# Patient Record
Sex: Male | Born: 1940 | Race: Black or African American | Hispanic: No | Marital: Married | Smoking: Current every day smoker
Health system: Southern US, Community
[De-identification: ages and names within clinical notes are randomized; demographics above are authoritative.]

## PROBLEM LIST (undated history)

## (undated) DIAGNOSIS — J449 Chronic obstructive pulmonary disease, unspecified: Secondary | ICD-10-CM

## (undated) DIAGNOSIS — M179 Osteoarthritis of knee, unspecified: Secondary | ICD-10-CM

## (undated) DIAGNOSIS — F329 Major depressive disorder, single episode, unspecified: Secondary | ICD-10-CM

## (undated) DIAGNOSIS — B9681 Helicobacter pylori [H. pylori] as the cause of diseases classified elsewhere: Secondary | ICD-10-CM

## (undated) DIAGNOSIS — I10 Essential (primary) hypertension: Secondary | ICD-10-CM

## (undated) DIAGNOSIS — K279 Peptic ulcer, site unspecified, unspecified as acute or chronic, without hemorrhage or perforation: Secondary | ICD-10-CM

## (undated) DIAGNOSIS — F419 Anxiety disorder, unspecified: Secondary | ICD-10-CM

## (undated) DIAGNOSIS — N029 Recurrent and persistent hematuria with unspecified morphologic changes: Secondary | ICD-10-CM

## (undated) DIAGNOSIS — K259 Gastric ulcer, unspecified as acute or chronic, without hemorrhage or perforation: Secondary | ICD-10-CM

## (undated) DIAGNOSIS — F99 Mental disorder, not otherwise specified: Secondary | ICD-10-CM

## (undated) DIAGNOSIS — I2510 Atherosclerotic heart disease of native coronary artery without angina pectoris: Secondary | ICD-10-CM

## (undated) DIAGNOSIS — Z9989 Dependence on other enabling machines and devices: Secondary | ICD-10-CM

## (undated) DIAGNOSIS — K219 Gastro-esophageal reflux disease without esophagitis: Secondary | ICD-10-CM

## (undated) DIAGNOSIS — F32A Depression, unspecified: Secondary | ICD-10-CM

## (undated) DIAGNOSIS — G4733 Obstructive sleep apnea (adult) (pediatric): Secondary | ICD-10-CM

## (undated) DIAGNOSIS — M1710 Unilateral primary osteoarthritis, unspecified knee: Secondary | ICD-10-CM

## (undated) DIAGNOSIS — E119 Type 2 diabetes mellitus without complications: Secondary | ICD-10-CM

## (undated) DIAGNOSIS — E7800 Pure hypercholesterolemia, unspecified: Secondary | ICD-10-CM

## (undated) DIAGNOSIS — K635 Polyp of colon: Secondary | ICD-10-CM

## (undated) DIAGNOSIS — K649 Unspecified hemorrhoids: Secondary | ICD-10-CM

## (undated) DIAGNOSIS — K5730 Diverticulosis of large intestine without perforation or abscess without bleeding: Secondary | ICD-10-CM

## (undated) DIAGNOSIS — H269 Unspecified cataract: Secondary | ICD-10-CM

## (undated) HISTORY — PX: STERNAL WIRE REMOVAL: SHX2440

## (undated) HISTORY — DX: Major depressive disorder, single episode, unspecified: F32.9

## (undated) HISTORY — PX: CARDIAC CATHETERIZATION: SHX172

## (undated) HISTORY — DX: Recurrent and persistent hematuria with unspecified morphologic changes: N02.9

## (undated) HISTORY — DX: Depression, unspecified: F32.A

## (undated) HISTORY — PX: BACK SURGERY: SHX140

## (undated) HISTORY — DX: Osteoarthritis of knee, unspecified: M17.9

## (undated) HISTORY — PX: KNEE SURGERY: SHX244

## (undated) HISTORY — DX: Gastro-esophageal reflux disease without esophagitis: K21.9

## (undated) HISTORY — DX: Unilateral primary osteoarthritis, unspecified knee: M17.10

## (undated) HISTORY — PX: OTHER SURGICAL HISTORY: SHX169

---

## 1979-06-02 HISTORY — PX: HEMORRHOID SURGERY: SHX153

## 2003-05-21 ENCOUNTER — Ambulatory Visit (HOSPITAL_COMMUNITY): Admission: RE | Admit: 2003-05-21 | Discharge: 2003-05-21 | Payer: Self-pay | Admitting: Internal Medicine

## 2003-05-23 ENCOUNTER — Ambulatory Visit (HOSPITAL_COMMUNITY): Admission: RE | Admit: 2003-05-23 | Discharge: 2003-05-23 | Payer: Self-pay | Admitting: Internal Medicine

## 2004-06-01 DIAGNOSIS — B9681 Helicobacter pylori [H. pylori] as the cause of diseases classified elsewhere: Secondary | ICD-10-CM

## 2004-06-01 DIAGNOSIS — K259 Gastric ulcer, unspecified as acute or chronic, without hemorrhage or perforation: Secondary | ICD-10-CM

## 2004-06-01 HISTORY — DX: Gastric ulcer, unspecified as acute or chronic, without hemorrhage or perforation: K25.9

## 2004-06-01 HISTORY — PX: CORONARY ARTERY BYPASS GRAFT: SHX141

## 2004-06-01 HISTORY — DX: Helicobacter pylori (H. pylori) as the cause of diseases classified elsewhere: B96.81

## 2004-08-11 ENCOUNTER — Ambulatory Visit: Payer: Self-pay | Admitting: Internal Medicine

## 2004-08-11 ENCOUNTER — Ambulatory Visit: Payer: Self-pay | Admitting: Cardiology

## 2004-08-14 ENCOUNTER — Ambulatory Visit: Payer: Self-pay | Admitting: Cardiology

## 2004-08-14 ENCOUNTER — Inpatient Hospital Stay (HOSPITAL_BASED_OUTPATIENT_CLINIC_OR_DEPARTMENT_OTHER): Admission: RE | Admit: 2004-08-14 | Discharge: 2004-08-14 | Payer: Self-pay | Admitting: Cardiology

## 2004-08-14 ENCOUNTER — Inpatient Hospital Stay (HOSPITAL_COMMUNITY): Admission: AD | Admit: 2004-08-14 | Discharge: 2004-08-21 | Payer: Self-pay | Admitting: Cardiology

## 2004-09-09 ENCOUNTER — Ambulatory Visit: Payer: Self-pay | Admitting: Cardiology

## 2004-10-20 ENCOUNTER — Ambulatory Visit: Payer: Self-pay | Admitting: Cardiology

## 2004-12-05 ENCOUNTER — Ambulatory Visit: Payer: Self-pay | Admitting: Cardiology

## 2004-12-10 ENCOUNTER — Inpatient Hospital Stay (HOSPITAL_COMMUNITY): Admission: AD | Admit: 2004-12-10 | Discharge: 2004-12-12 | Payer: Self-pay | Admitting: Internal Medicine

## 2004-12-10 ENCOUNTER — Ambulatory Visit: Payer: Self-pay | Admitting: Internal Medicine

## 2004-12-11 ENCOUNTER — Ambulatory Visit: Payer: Self-pay | Admitting: Gastroenterology

## 2004-12-26 ENCOUNTER — Ambulatory Visit: Payer: Self-pay | Admitting: Internal Medicine

## 2005-02-25 ENCOUNTER — Ambulatory Visit: Payer: Self-pay | Admitting: Cardiology

## 2005-06-30 ENCOUNTER — Ambulatory Visit: Payer: Self-pay | Admitting: Cardiology

## 2005-11-16 ENCOUNTER — Ambulatory Visit: Payer: Self-pay | Admitting: Internal Medicine

## 2006-10-31 DIAGNOSIS — N029 Recurrent and persistent hematuria with unspecified morphologic changes: Secondary | ICD-10-CM

## 2006-10-31 HISTORY — DX: Recurrent and persistent hematuria with unspecified morphologic changes: N02.9

## 2007-08-06 ENCOUNTER — Encounter: Payer: Self-pay | Admitting: *Deleted

## 2007-08-06 DIAGNOSIS — Z8719 Personal history of other diseases of the digestive system: Secondary | ICD-10-CM

## 2007-08-06 DIAGNOSIS — J449 Chronic obstructive pulmonary disease, unspecified: Secondary | ICD-10-CM

## 2007-08-06 DIAGNOSIS — Z951 Presence of aortocoronary bypass graft: Secondary | ICD-10-CM

## 2007-08-06 DIAGNOSIS — F17200 Nicotine dependence, unspecified, uncomplicated: Secondary | ICD-10-CM | POA: Insufficient documentation

## 2007-08-06 DIAGNOSIS — K5730 Diverticulosis of large intestine without perforation or abscess without bleeding: Secondary | ICD-10-CM | POA: Insufficient documentation

## 2007-08-06 DIAGNOSIS — J4489 Other specified chronic obstructive pulmonary disease: Secondary | ICD-10-CM | POA: Insufficient documentation

## 2007-08-06 DIAGNOSIS — E785 Hyperlipidemia, unspecified: Secondary | ICD-10-CM

## 2007-08-06 DIAGNOSIS — Z8711 Personal history of peptic ulcer disease: Secondary | ICD-10-CM

## 2007-08-06 DIAGNOSIS — I2510 Atherosclerotic heart disease of native coronary artery without angina pectoris: Secondary | ICD-10-CM | POA: Insufficient documentation

## 2007-08-06 DIAGNOSIS — Z9889 Other specified postprocedural states: Secondary | ICD-10-CM

## 2010-10-31 HISTORY — PX: OTHER SURGICAL HISTORY: SHX169

## 2011-02-09 ENCOUNTER — Telehealth: Payer: Self-pay | Admitting: *Deleted

## 2011-02-09 DIAGNOSIS — Z0000 Encounter for general adult medical examination without abnormal findings: Secondary | ICD-10-CM

## 2011-02-09 NOTE — Telephone Encounter (Signed)
[facility] 

## 2012-06-20 ENCOUNTER — Encounter (HOSPITAL_COMMUNITY): Payer: Self-pay | Admitting: *Deleted

## 2012-06-20 ENCOUNTER — Observation Stay (HOSPITAL_COMMUNITY)
Admission: EM | Admit: 2012-06-20 | Discharge: 2012-06-22 | Disposition: A | Discharge status: Home / Self Care | Payer: Non-veteran care | Attending: Internal Medicine | Admitting: Internal Medicine

## 2012-06-20 ENCOUNTER — Telehealth: Payer: Self-pay | Admitting: Internal Medicine

## 2012-06-20 DIAGNOSIS — G4730 Sleep apnea, unspecified: Secondary | ICD-10-CM | POA: Insufficient documentation

## 2012-06-20 DIAGNOSIS — I10 Essential (primary) hypertension: Secondary | ICD-10-CM | POA: Insufficient documentation

## 2012-06-20 DIAGNOSIS — K5730 Diverticulosis of large intestine without perforation or abscess without bleeding: Secondary | ICD-10-CM | POA: Diagnosis present

## 2012-06-20 DIAGNOSIS — K922 Gastrointestinal hemorrhage, unspecified: Secondary | ICD-10-CM | POA: Diagnosis present

## 2012-06-20 DIAGNOSIS — K625 Hemorrhage of anus and rectum: Secondary | ICD-10-CM

## 2012-06-20 DIAGNOSIS — Z860100 Personal history of colon polyps, unspecified: Secondary | ICD-10-CM | POA: Insufficient documentation

## 2012-06-20 DIAGNOSIS — K921 Melena: Principal | ICD-10-CM | POA: Insufficient documentation

## 2012-06-20 DIAGNOSIS — Z79899 Other long term (current) drug therapy: Secondary | ICD-10-CM | POA: Insufficient documentation

## 2012-06-20 DIAGNOSIS — Z7982 Long term (current) use of aspirin: Secondary | ICD-10-CM | POA: Insufficient documentation

## 2012-06-20 DIAGNOSIS — F17200 Nicotine dependence, unspecified, uncomplicated: Secondary | ICD-10-CM

## 2012-06-20 DIAGNOSIS — E7800 Pure hypercholesterolemia, unspecified: Secondary | ICD-10-CM | POA: Insufficient documentation

## 2012-06-20 DIAGNOSIS — Z791 Long term (current) use of non-steroidal anti-inflammatories (NSAID): Secondary | ICD-10-CM | POA: Insufficient documentation

## 2012-06-20 DIAGNOSIS — I2510 Atherosclerotic heart disease of native coronary artery without angina pectoris: Secondary | ICD-10-CM | POA: Insufficient documentation

## 2012-06-20 HISTORY — DX: Dependence on other enabling machines and devices: Z99.89

## 2012-06-20 HISTORY — DX: Pure hypercholesterolemia, unspecified: E78.00

## 2012-06-20 HISTORY — DX: Diverticulosis of large intestine without perforation or abscess without bleeding: K57.30

## 2012-06-20 HISTORY — DX: Unspecified cataract: H26.9

## 2012-06-20 HISTORY — DX: Essential (primary) hypertension: I10

## 2012-06-20 HISTORY — DX: Helicobacter pylori (H. pylori) as the cause of diseases classified elsewhere: B96.81

## 2012-06-20 HISTORY — DX: Mental disorder, not otherwise specified: F99

## 2012-06-20 HISTORY — DX: Peptic ulcer, site unspecified, unspecified as acute or chronic, without hemorrhage or perforation: K27.9

## 2012-06-20 HISTORY — DX: Polyp of colon: K63.5

## 2012-06-20 HISTORY — DX: Atherosclerotic heart disease of native coronary artery without angina pectoris: I25.10

## 2012-06-20 HISTORY — DX: Gastric ulcer, unspecified as acute or chronic, without hemorrhage or perforation: K25.9

## 2012-06-20 HISTORY — DX: Chronic obstructive pulmonary disease, unspecified: J44.9

## 2012-06-20 HISTORY — DX: Obstructive sleep apnea (adult) (pediatric): G47.33

## 2012-06-20 HISTORY — DX: Unspecified hemorrhoids: K64.9

## 2012-06-20 LAB — CBC WITH DIFFERENTIAL/PLATELET
Basophils Absolute: 0 10*3/uL (ref 0.0–0.1)
Basophils Relative: 0 % (ref 0–1)
Eosinophils Absolute: 0.1 10*3/uL (ref 0.0–0.7)
Eosinophils Relative: 1 % (ref 0–5)
HCT: 43 % (ref 39.0–52.0)
Hemoglobin: 14.8 g/dL (ref 13.0–17.0)
Lymphocytes Relative: 37 % (ref 12–46)
Lymphs Abs: 2.7 10*3/uL (ref 0.7–4.0)
MCH: 29.8 pg (ref 26.0–34.0)
MCHC: 34.4 g/dL (ref 30.0–36.0)
MCV: 86.7 fL (ref 78.0–100.0)
Monocytes Absolute: 0.7 10*3/uL (ref 0.1–1.0)
Monocytes Relative: 10 % (ref 3–12)
Neutro Abs: 3.8 10*3/uL (ref 1.7–7.7)
Neutrophils Relative %: 52 % (ref 43–77)
Platelets: 247 10*3/uL (ref 150–400)
RBC: 4.96 MIL/uL (ref 4.22–5.81)
RDW: 13 % (ref 11.5–15.5)
WBC: 7.4 10*3/uL (ref 4.0–10.5)

## 2012-06-20 LAB — COMPREHENSIVE METABOLIC PANEL
ALT: 15 U/L (ref 0–53)
AST: 12 U/L (ref 0–37)
Albumin: 3.5 g/dL (ref 3.5–5.2)
Alkaline Phosphatase: 53 U/L (ref 39–117)
BUN: 17 mg/dL (ref 6–23)
CO2: 26 mEq/L (ref 19–32)
Calcium: 9 mg/dL (ref 8.4–10.5)
Chloride: 104 mEq/L (ref 96–112)
Creatinine, Ser: 0.94 mg/dL (ref 0.50–1.35)
GFR calc Af Amer: >90 mL/min (ref >90)
GFR calc non Af Amer: 83 mL/min — ABNORMAL LOW (ref >90)
Glucose, Bld: 116 mg/dL — ABNORMAL HIGH (ref 70–99)
Potassium: 3.8 mEq/L (ref 3.5–5.1)
Sodium: 140 mEq/L (ref 135–145)
Total Bilirubin: 0.8 mg/dL (ref 0.3–1.2)
Total Protein: 6.7 g/dL (ref 6.0–8.3)

## 2012-06-20 LAB — APTT: aPTT: 32 seconds (ref 24–37)

## 2012-06-20 LAB — PROTIME-INR
INR: 0.95 (ref 0.00–1.49)
Prothrombin Time: 12.6 seconds (ref 11.6–15.2)

## 2012-06-20 LAB — OCCULT BLOOD, POC DEVICE: Fecal Occult Bld: POSITIVE — AB

## 2012-06-20 MED ORDER — SODIUM CHLORIDE 0.9 % IV SOLN
INTRAVENOUS | Status: DC
  Administered 2012-06-20 – 2012-06-21 (×2): via INTRAVENOUS

## 2012-06-20 MED ORDER — CITALOPRAM HYDROBROMIDE 10 MG PO TABS
5.0000 mg | ORAL_TABLET | Freq: Every day | ORAL | Status: DC
  Administered 2012-06-20 – 2012-06-22 (×3): 5 mg via ORAL
  Filled 2012-06-20 (×3): qty 1

## 2012-06-20 MED ORDER — ONDANSETRON HCL 4 MG/2ML IJ SOLN: 4.0000 mg | Freq: Four times a day (QID) | INTRAMUSCULAR | Status: DC | PRN

## 2012-06-20 MED ORDER — ACETAMINOPHEN 325 MG PO TABS: 650.0000 mg | ORAL_TABLET | Freq: Four times a day (QID) | ORAL | Status: DC | PRN

## 2012-06-20 MED ORDER — ONDANSETRON HCL 4 MG PO TABS: 4.0000 mg | ORAL_TABLET | Freq: Four times a day (QID) | ORAL | Status: DC | PRN

## 2012-06-20 MED ORDER — ONDANSETRON HCL 4 MG/2ML IJ SOLN: 4.0000 mg | Freq: Three times a day (TID) | INTRAMUSCULAR | Status: DC | PRN

## 2012-06-20 MED ORDER — SODIUM CHLORIDE 0.9 % IV SOLN
Freq: Once | INTRAVENOUS | Status: AC
  Administered 2012-06-20: 13:00:00 via INTRAVENOUS

## 2012-06-20 MED ORDER — ACETAMINOPHEN 650 MG RE SUPP: 650.0000 mg | Freq: Four times a day (QID) | RECTAL | Status: DC | PRN

## 2012-06-20 NOTE — ED Notes (Signed)
Pt reports having blood in his stool x 2 days.  Denies pain. Hx of bleeding polyp.  Pt denies dizziness.  Pt does not appear pale.  Ambulatory without difficulty.

## 2012-06-20 NOTE — ED Provider Notes (Signed)
History     CSN: [id number]  Arrival date & time 06/20/12  1049   First MD Initiated Contact with Patient 06/20/12 1114      Chief Complaint  Patient presents with  . Rectal Bleeding    (Consider location/radiation/quality/duration/timing/severity/associated sxs/prior treatment) HPI Comments: Patient reports painless rectal bleeding that he noticed on Saturday evening associated with some stool. He reports he had several all day yesterday, more than 5 episodes of just blood with no stool. He denies any abdominal pain or rectal pain. He has a history of colonic polyps that were removed many years in the past as well as hemorrhoids. He reports he normally goes to the [facility] and last had a colonoscopy about 3 years ago there and also had a colonoscopy with [facility] many years ago with polyps removed at that time as well. He denies feeling lightheaded, faint. He denies night sweats, unusual weight loss. He reports no prior history of cancers. He also denies history of diverticulosis. He used to see Dr. Debby Bud but has not seen him in many years. Family spoke to the office this morning but I do not believe any callbacks were obtained. Due to the frequency of the bloody stools and continuation this morning, the patient presented himself to the emergency department by private vehicle.  Patient is a 72 y.o. male presenting with hematochezia. The history is provided by the patient and the spouse.  Rectal Bleeding  Pertinent negatives include no fever, no abdominal pain, no diarrhea, no nausea, no vomiting, no chest pain and no headaches.    Past Medical History  Diagnosis Date  . Hypertension   . Hypercholesteremia   . Colon polyps hemorrhoid  . Hemorrhoid     Past Surgical History  Procedure Date  . Knee surgery     left knee    History reviewed. No pertinent family history.  History  Substance Use Topics  . Smoking status: Current Every Day Smoker -- 0.5 packs/day    Types:  Cigarettes  . Smokeless tobacco: Not on file  . Alcohol Use: Yes     Comment: socially      Review of Systems  Constitutional: Negative for fever, chills and fatigue.  Respiratory: Negative for shortness of breath.   Cardiovascular: Negative for chest pain.  Gastrointestinal: Positive for blood in stool, hematochezia and anal bleeding. Negative for nausea, vomiting, abdominal pain, diarrhea and abdominal distention.  Musculoskeletal: Negative for back pain.  Neurological: Negative for dizziness, syncope, light-headedness and headaches.  All other systems reviewed and are negative.    Allergies  Sulfonamide derivatives  Home Medications   Current Outpatient Rx  Name  Route  Sig  Dispense  Refill  . ASPIRIN EC 81 MG PO TBEC   Oral   Take 81 mg by mouth daily.         Marland Kitchen CITALOPRAM HYDROBROMIDE 10 MG PO TABS   Oral   Take 5 mg by mouth daily. Take one-half tablet by mouth once every day.         . IBUPROFEN 200 MG PO TABS   Oral   Take 200 mg by mouth every 6 (six) hours as needed. For pain.         Marland Kitchen LISINOPRIL 20 MG PO TABS   Oral   Take 10 mg by mouth daily. Take one-half tab by mouth once every day for blood pressure.         Marland Kitchen METOPROLOL SUCCINATE ER 25 MG PO TB24  Oral   Take 25 mg by mouth 2 (two) times daily.         Marland Kitchen NAPROXEN 500 MG PO TABS   Oral   Take 500 mg by mouth 2 (two) times daily with a meal.           BP 136/68  Pulse 73  Temp 97.6 F (36.4 C) (Oral)  Resp 18  SpO2 100%  Physical Exam  Nursing note and vitals reviewed. Constitutional: He is oriented to person, place, and time. He appears well-developed and well-nourished. No distress.  HENT:  Head: Normocephalic and atraumatic.  Neck: Normal range of motion. Neck supple.  Cardiovascular: Normal rate.   No murmur heard. Pulmonary/Chest: Effort normal. No respiratory distress.  Abdominal: Soft. Bowel sounds are normal. He exhibits no distension. There is no tenderness.  There is no rebound.  Neurological: He is alert and oriented to person, place, and time.  Skin: Skin is warm and dry. He is not diaphoretic.  Psychiatric: He has a normal mood and affect.    ED Course  Procedures (including critical care time)  Labs Reviewed  COMPREHENSIVE METABOLIC PANEL - Abnormal; Notable for the following:    Glucose, Bld 116 (*)     GFR calc non Af Amer 83 (*)     All other components within normal limits  CBC WITH DIFFERENTIAL   No results found.   1. Rectal bleeding     Room air saturation is 97% and I interpret this to be on normal.  1:12 PM [facility] has been consulted.  1:16 PM Will speak to [facility].  I spoke to Dr. Debby Bud who will be accepting pt back onto his clinic as an established patient.    MDM   Patient with painless rectal bleeding, and no symptoms of orthostasis by history. His blood pressure and heart rate are normal here. Plan is to check orthostatics, a hemoglobin and will do a rectal examination to assess for amount of rectal bleeding. Patient will need a colonoscopy either as an outpatient or inpatient.  He does not appear to be unstable.  He is not on coumadin or plavix.  He is on naprosyn.  He is told to discontinue naprosyn for now.          Gavin Pound. Auguste Tebbetts, MD 06/20/12 1316

## 2012-06-20 NOTE — Telephone Encounter (Signed)
But of course. Yes

## 2012-06-20 NOTE — H&P (Signed)
[facility] History and Physical  Bradley French ZOX:[id number] DOB: 10/26/1940 DOA: 06/20/2012  Referring physician: Dr. Oletta Lamas PCP: Pcp Not In System  Specialists: Dr. Leone Payor  Chief Complaint: Bright red blood per rectum  HPI: Bradley French is a 72 y.o. male  This is a 72 year old male with a history of diverticulosis in 2006, 2 previous episodes of lower GI bleed, presents with a chief complaint of bright red blood since Saturday night. He states that he has had several bowel movements in which she saw just blood. He estimates more than 2 cups of blood in the last day. His last bloody bowel movement was last night. He denies any lightheadedness, dizziness, chest pain, breathing difficulties. He has no abdominal pain, denies any nausea or vomiting. He has no fever and has no chills. He is taking NSAIDs, in the form of naproxen twice daily and ibuprofen daily. He drinks alcohol mostly in weekends. He considers himself a moderate drinker, but he cannot really elaborate on how many servings he has.  He also has a history of coronary artery disease status post CABG. He denies any chest pain.  Review of Systems: 10 system review of system was performed, pertinent positives and negatives as per history of present illness.  Past Medical History  Diagnosis Date  . Hypertension   . Hypercholesteremia   . Colon polyps ???    none on 2006 colonoscopy  . Hemorrhoid   . Diverticulosis of colon   . Gastric ulcer 2006    ? secondary to H Pylori, vs ASA.   Marland Kitchen H pylori ulcer 2006    treated with Prevpac  . Coronary artery disease   . Mental disorder   . COPD (chronic obstructive pulmonary disease)    Past Surgical History  Procedure Date  . Knee surgery     left knee  . Coronary artery bypass graft 2006    2 vessell  . Hemorrhoid surgery 1981   Social History:  reports that he has been smoking Cigarettes.  He has been smoking about .5 packs per day. He does not have any smokeless  tobacco history on file. He reports that he drinks alcohol. He reports that he does not use illicit drugs.  Allergies  Allergen Reactions  . Sulfonamide Derivatives Rash    Family History  Problem Relation Age of Onset  . Diabetes Brother     Prior to Admission medications   Medication Sig Start Date End Date Taking? Authorizing Provider  aspirin EC 81 MG tablet Take 81 mg by mouth daily.   Yes Historical Provider, MD  citalopram (CELEXA) 10 MG tablet Take 5 mg by mouth daily. Take one-half tablet by mouth once every day.   Yes Historical Provider, MD  ibuprofen (ADVIL,MOTRIN) 200 MG tablet Take 200 mg by mouth every 6 (six) hours as needed. For pain.   Yes Historical Provider, MD  lisinopril (PRINIVIL,ZESTRIL) 20 MG tablet Take 10 mg by mouth daily. Take one-half tab by mouth once every day for blood pressure.   Yes Historical Provider, MD  metoprolol succinate (TOPROL-XL) 25 MG 24 hr tablet Take 25 mg by mouth 2 (two) times daily.   Yes Historical Provider, MD  naproxen (NAPROSYN) 500 MG tablet Take 500 mg by mouth 2 (two) times daily with a meal.   Yes Historical Provider, MD   Physical Exam: Filed Vitals:   06/20/12 1300 06/20/12 1400 06/20/12 1500 06/20/12 1555  BP: 136/68 142/72 146/67 155/69  Pulse: 73 70 67  71  Temp:    98.3 F (36.8 C)  TempSrc:    Oral  Resp:   16 18  Height:    6\' 4"  (1.93 m)  Weight:    103.828 kg (228 lb 14.4 oz)  SpO2: 100% 100% 100% 97%     General:  He is in no acute distress  Eyes: No scleral icterus  ENT: Moist oropharynx  Neck: Supple, no JVD  Cardiovascular: Regular rate and rhythm, no murmurs  Respiratory: Clear to auscultation bilaterally  Abdomen: Soft nontender to palpation  Skin: No rashes  Musculoskeletal: Has no peripheral edema  Psychiatric: Normal mood and affect  Neurologic: No focal findings  Labs on Admission:  Basic Metabolic Panel:  Lab 06/20/12 1610  NA 140  K 3.8  CL 104  CO2 26  GLUCOSE 116*  BUN  17  CREATININE 0.94  CALCIUM 9.0  MG --  PHOS --   Liver Function Tests:  Lab 06/20/12 1059  AST 12  ALT 15  ALKPHOS 53  BILITOT 0.8  PROT 6.7  ALBUMIN 3.5   CBC:  Lab 06/20/12 1059  WBC 7.4  NEUTROABS 3.8  HGB 14.8  HCT 43.0  MCV 86.7  PLT 247   EKG: Independently reviewed.   Assessment/Plan Active Problems:  DIVERTICULOSIS, COLON  Lower GI bleed  CAD (coronary artery disease)   1. Lower GI bleed - gastroenterology service was consulted, we appreciate their input. Patient is hemodynamically stable. Orthostatics in the ER showed a 20 point decrease in his systolic blood pressure upon standing, however patient denies feeling lightheaded or dizzy. We'll give IV fluids. This is unlikely that it's an upper GI bleed. Her GI recommendations, will allow clear liquid diet for now and monitor overnight. We will hold his home medications, his lisinopril and metoprolol, to prevent further hypotension if he is to bleed more overnight. We will discontinue his naproxen, ibuprofen and aspirin. 2. Prophylaxis - will hold heparin secondary to his GI bleed.   Code Status: Full code (must indicate code status--if unknown or must be presumed, indicate so) Family Communication: None (indicate person spoken with, if applicable, with phone number if by telephone) Disposition Plan: 1-2 days (indicate anticipated LOS)  Time spent: 50  Pamella Pert [facility] Pager [phone]  If 7PM-7AM, please contact night-coverage [email or web address] Password [facility] 06/20/2012, 5:15 PM

## 2012-06-20 NOTE — Consult Note (Addendum)
[facility] Consult: 2:28 PM 06/20/2012   Referring Provider: Dr Oletta Lamas  [facility]  [facility] [place]  Primary Gastroenterologist:  Dr. Arlyce Dice   Reason for Consultation:  Painless rectal bleeding.   HPI: Bradley French is a 72 y.o. male.  Hx of diverticulosis 2006, GI bleed secondary to gastric ulcer in 2006.  Hx of colon polyps per his report, but none seen in 2006.  Last colonoscopy around 2011 at [facility].  It may have been  ? erlier Then that he had colon polyps.  Has not been sent call back letter from GI clinic, nor is he aware of when repeat study is due.   Developed painless hematochezia beginning on 1/18 in PM.  This continued 1/19 with at least 10 episodes.   Never became dizzy, SOB, weak.  Last bleeding was around midnite today.  Has occasional minor BPR with wiping.  He recalls prior similar bleeding in past but is vague as to when and the outcome.   Takes Naprosyn twice daily and Ibuprofen 200 mg once daily.  Drinks up to 1 pint of Brandy daily, but mostly drinks about 4 fingers of this on daily basis.     Past Medical History  Diagnosis Date  . Hypertension   . Hypercholesteremia   . Colon polyps ???    none on 2006 colonoscopy  . Hemorrhoid   . Diverticulosis of colon   . Gastric ulcer 2006    ? secondary to H Pylori, vs ASA.   Marland Kitchen H pylori ulcer 2006    treated with Prevpac    Past Surgical History  Procedure Date  . Knee surgery     left knee  . Coronary artery bypass graft 2006    2 vessell  . Hemorrhoid surgery 1981    Prior to Admission medications   Medication Sig Start Date End Date Taking? Authorizing Provider  aspirin EC 81 MG tablet Take 81 mg by mouth daily.   Yes Historical Provider, MD  citalopram (CELEXA) 10 MG tablet Take 5 mg by mouth daily. Take one-half tablet by mouth once every day.   Yes Historical Provider, MD  ibuprofen (ADVIL,MOTRIN) 200 MG tablet Take 200 mg by mouth every 6  (six) hours as needed. For pain.   Yes Historical Provider, MD  lisinopril (PRINIVIL,ZESTRIL) 20 MG tablet Take 10 mg by mouth daily. Take one-half tab by mouth once every day for blood pressure.   Yes Historical Provider, MD  metoprolol succinate (TOPROL-XL) 25 MG 24 hr tablet Take 25 mg by mouth 2 (two) times daily.   Yes Historical Provider, MD  naproxen (NAPROSYN) 500 MG tablet Take 500 mg by mouth 2 (two) times daily with a meal.   Yes Historical Provider, MD    Scheduled Meds:   Infusions:    . sodium chloride     PRN Meds: acetaminophen, acetaminophen, ondansetron (ZOFRAN) IV, ondansetron   Allergies as of 06/20/2012 - Review Complete 06/20/2012  Allergen Reaction Noted  . Sulfonamide derivatives Rash     Family HX - parents died of unknown causes when he was [place] - a brother has DM  History   Social History  . Marital Status: Married                 Occupational History  . Retired Soil scientist   Social History Main Topics  . Smoking status: Current Every Day Smoker -- 0.5 packs/day    Types: Cigarettes  . Smokeless tobacco:  Not on file  . Alcohol Use: Yes     Brandy:   4 fingers to 1 pint daily  . Drug Use: No  .     Social History Narrative  . Retired Location manager.     REVIEW OF SYSTEMS: Constitutional:  No weight gain.  Some 5 to 10 # weight loss.  ENT:  No nose bleeds.  Hi is HOH Pulm:  No cough, no SOB, no PND CV:  no chest pain or palps GU:  No dysuria or hematuria GI:  As per HPI Heme:  No hx of anemia.    Transfusions:  Recalls no transfusions of blood products Neuro:  No headache, no ataxia.  Derm:  No rash or sores MS:  Left knee pain so does not exercise.  Endocrine:  No excessive thirst or urination.  No sweats Immunization:  Fall 2013 flu shot.  Pneumovax status he does not know Travel:  none  PHYSICAL EXAM: Vital signs in last 24 hours: Temp:  [97.6 F (36.4 C)] 97.6 F (36.4 C) (01/20 1056) Pulse Rate:   [70-85] 70  (01/20 1400) Resp:  [18] 18  (01/20 1056) BP: (134-160)/(68-85) 142/72 mmHg (01/20 1400) SpO2:  [97 %-100 %] 100 % (01/20 1400)  General: well appearing elderly AAM.  comfortable Head:  No trauma or asymmetry  Eyes:  No icterus, no pallor Ears:  Somewhat HOH.  Nose:  No discharge or congestion Mouth:  Fair dentition.  No sores, no bleeding Neck:  No bruits, no TMG or masses.  No JVD Lungs:  Crackles in bases B.  Goes 1/2 way up lung on the right.  No cough or dyspnea Heart: RRR.  No MRG Abdomen:  Soft, NT, ND, no HSM  Or masses.   Rectal: no blood or stool on glove, no masses, no hemorrhoids   Musc/Skeltl: scars of left knee.  No joint swelling Extremities:  No pedal edema  Neurologic:  Oriented x 3.  Vague historian, recalls limited medical details.  No tremor.  No gross deficits Skin:  No rash, no telangectasia, no sores Tattoos:  none Nodes:  No cervical adenopathy   Psych:  Pleasant, relaxed      LAB RESULTS:  Basename 06/20/12 1059  WBC 7.4  HGB 14.8  HCT 43.0  PLT 247   BMET Lab Results  Component Value Date   NA 140 06/20/2012   K 3.8 06/20/2012   CL 104 06/20/2012   CO2 26 06/20/2012   GLUCOSE 116* 06/20/2012   BUN 17 06/20/2012   CREATININE 0.94 06/20/2012   CALCIUM 9.0 06/20/2012   LFT  Basename 06/20/12 1059  PROT 6.7  ALBUMIN 3.5  AST 12  ALT 15  ALKPHOS 53  BILITOT 0.8  BILIDIR --  IBILI --     RADIOLOGY STUDIES: None so far.   ENDOSCOPIC STUDIES: July 2006  This is copy/paste from  A d/c summary: "Esophagogastroduodenoscopy and colonoscopy July 13.  EGD withgastric ulcer, no acute hemorrhage, and duodenitis.  Colonoscopy with diffuse diverticulosis, no active bleeding identified. H Pylori positive, treated with Prevpac 2006" I am unable to locate actual reports.   IMPRESSION: *  Acute lower GI bleeding, probably diverticular.  Hemodynamically stable and not yet anemic. Last bleeding was 15 hours ago.  *  2006 gastric ulcer a/w  GI bleed, H Pylori treated then.  No longer takes nor requires PPI.  Does use regular doses of 2 different NSAIDS (needs education regarding this) His BUN is normal and not  having melenic stool, so less likely the bleeding is UGI in source.  *  Hx colon polyps, he is not sure these were found, but it was not in 2006 or prior to that.  PLAN: *  Per Dr Leone Payor Not clear he will need a colonoscopy.  He is seemingly up to date with colonoscopy surveillance *  Allow clear liquids.  I am not starting PPI, as this seems to be a LGIB.  *  CBC in AM   LOS: 0 days   [name]  06/20/2012, 2:28 PM Pager: [phone]  [facility] Attending  I have also seen and assessed the patient and agree with the above note. Hx and PE reviewed/repeate as appropriate. Some additions made.  Painless hematochezia/Lower GI bleed that appears stopped presently. Hgb still NL. Multiple past colonoscopies and has known diverticulosis. Supportive care for now and obtain record of last colonoscopy at [facility]. Agree w/ stopping NSAID's He is also on citalopram and SSRI impairs PLT activity some so might consider stopping that  Iva Boop, MD, [facility] [phone] (pager) 06/20/2012 4:39 PM

## 2012-06-20 NOTE — Telephone Encounter (Signed)
The patient's son called and is hoping to get the patient re-established with you.  The patient has not been seen for several years.  The son states he has been treated at the [facility] hospital, since he has [employer].  He now is experiencing rectal bleeding.  The son does not feel comfortable taking him to the [place], so he was going to take him to the emergency room this morning.   Is it okay if he re-establishes with you?

## 2012-06-21 DIAGNOSIS — K5730 Diverticulosis of large intestine without perforation or abscess without bleeding: Secondary | ICD-10-CM

## 2012-06-21 DIAGNOSIS — I2510 Atherosclerotic heart disease of native coronary artery without angina pectoris: Secondary | ICD-10-CM

## 2012-06-21 DIAGNOSIS — F17200 Nicotine dependence, unspecified, uncomplicated: Secondary | ICD-10-CM

## 2012-06-21 DIAGNOSIS — K922 Gastrointestinal hemorrhage, unspecified: Secondary | ICD-10-CM

## 2012-06-21 LAB — CBC
HCT: 37 % — ABNORMAL LOW (ref 39.0–52.0)
Hemoglobin: 12.7 g/dL — ABNORMAL LOW (ref 13.0–17.0)
MCH: 29.7 pg (ref 26.0–34.0)
MCHC: 34.3 g/dL (ref 30.0–36.0)
MCV: 86.4 fL (ref 78.0–100.0)

## 2012-06-21 MED ORDER — NICOTINE 14 MG/24HR TD PT24
14.0000 mg | MEDICATED_PATCH | Freq: Every day | TRANSDERMAL | Status: DC
  Administered 2012-06-21 – 2012-06-22 (×2): 14 mg via TRANSDERMAL
  Filled 2012-06-21 (×2): qty 1

## 2012-06-21 NOTE — Progress Notes (Signed)
     Bradley French 06/21/2012, 10:18 AM  SUBJECTIVE:       Brown stool after breakfast today.  Walking in hall without problems. Diet now is heart healthy/  OBJECTIVE:         Vital signs in last 24 hours:    Temp:  [97.6 F (36.4 C)-98.3 F (36.8 C)] 97.7 F (36.5 C) (01/21 0538) Pulse Rate:  [66-85] 66  (01/21 0538) Resp:  [16-18] 18  (01/21 0538) BP: (134-160)/(67-85) 145/79 mmHg (01/21 0538) SpO2:  [97 %-100 %] 97 % (01/21 0538) Weight:  [103.828 kg (228 lb 14.4 oz)] 103.828 kg (228 lb 14.4 oz) (01/20 1555) Last BM Date: 06/20/12 General: looks well.    Heart: RRR Chest: clear B.  No resp distress Abdomen: soft, active BS, ND, NT  Extremities: no pedal edema Neuro/Psych:  Pleasnt, not confused. Cooperative.    Intake/Output from previous day: 01/20 0701 - 01/21 0700 In: 917.5 [I.V.:917.5] Out: -   Intake/Output this shift: Total I/O In: 380 [P.O.:380] Out: -   Lab Results:  Basename 06/21/12 0555 06/20/12 1059  WBC 6.3 7.4  HGB 12.7* 14.8  HCT 37.0* 43.0  PLT 224 247   BMET  Basename 06/20/12 1059  NA 140  K 3.8  CL 104  CO2 26  GLUCOSE 116*  BUN 17  CREATININE 0.94  CALCIUM 9.0   LFT  Basename 06/20/12 1059  PROT 6.7  ALBUMIN 3.5  AST 12  ALT 15  ALKPHOS 53  BILITOT 0.8  BILIDIR --  IBILI --   PT/INR  Basename 06/20/12 1319  LABPROT 12.6  INR 0.95   ASSESMENT: *  Lower GI bleed.  Known diverticulosis, bleed presumed diverticular.   Appears to have resolved *  Chronic NSAIDs *  2 gram drop in Hgb. Not in need of transfusion.    PLAN: *  Observe overnight.  *  [facility] from [facility] requested.....Marland Kitchen pts wife will bring [facility] that he keeps at home, may have colon rport in this.    LOS: 1 day   [name]  06/21/2012, 10:18 AM Pager: [phone]   [facility] Attending  I have also seen and assessed the patient and agree with the above French.No bleeding. If continues this way home tomorrow sensible.  Just want to see [facility] records to be better informed about last colonoscopy results.  He could need a repeat elective outpatient colonoscopy soon but doubt he will. Records review will help.  Stay off NSAIDS as much as possible  Iva Boop, MD, [facility] [phone] (pager) 06/21/2012 3:24 PM

## 2012-06-21 NOTE — Progress Notes (Signed)
Subjective: [name] had multiple episodes of painless hematochezia for which he came to the ED and has been admitted with probably LGI bleed. He has been seen by Dr. Leone Payor for LGI. He reports that he had colonoscopy at the [place] approximately 2 years ago. Local records suggest he has a h/o diverticulosis.  Objective: Lab: Lab Results  Component Value Date   WBC 6.3 06/21/2012   HGB 12.7* 06/21/2012   HCT 37.0* 06/21/2012   MCV 86.4 06/21/2012   PLT 224 06/21/2012   BMET    Component Value Date/Time   NA 140 06/20/2012 1059   K 3.8 06/20/2012 1059   CL 104 06/20/2012 1059   CO2 26 06/20/2012 1059   GLUCOSE 116* 06/20/2012 1059   BUN 17 06/20/2012 1059   CREATININE 0.94 06/20/2012 1059   CALCIUM 9.0 06/20/2012 1059   GFRNONAA 83* 06/20/2012 1059   GFRAA >90 06/20/2012 1059     Imaging:  Scheduled Meds:   . citalopram  5 mg Oral Daily   Continuous Infusions:   . sodium chloride 75 mL/hr at 06/21/12 0535   PRN Meds:.acetaminophen, acetaminophen, ondansetron (ZOFRAN) IV, ondansetron   Physical Exam: Filed Vitals:   06/21/12 0538  BP: 145/79  Pulse: 66  Temp: 97.7 F (36.5 C)  Resp: 18   Gen'l - WNWD AA man in no distress HEENT_ C&S clear Cor- 2+ radial , RRR PUlm  - normal respirations  Abd - BS+, soft, no guarding or rebound.  Neuro - A&O x 3      Assessment/Plan: 1. GI - most likely a LGI bleed with no bleeding since about midnight Sunday. His Hgb is stable. He has no SOB or CP. Plan Up ad lib  F/u H?H in AM  advnace diet  Decision on endoscopy deferred to GI  2. CV- h/o CAD s/p CABG. Doing well. No chest pain. Plan Will add lipid panel to lab  3. Tobacco abuse - still smokes. Advised to stop  If no bleed and H/H stable home in AM   [employer] [facility] (o) [phone]; (c) [phone] Call-grp - [facility]  Tele: [phone]  06/21/2012, 7:33 AM

## 2012-06-22 ENCOUNTER — Encounter (HOSPITAL_COMMUNITY): Payer: Self-pay | Admitting: Physician Assistant

## 2012-06-22 DIAGNOSIS — K922 Gastrointestinal hemorrhage, unspecified: Secondary | ICD-10-CM

## 2012-06-22 LAB — LIPID PANEL
HDL: 56 mg/dL (ref >39)
LDL Cholesterol: 101 mg/dL — ABNORMAL HIGH (ref 0–99)

## 2012-06-22 LAB — HEMOGLOBIN AND HEMATOCRIT, BLOOD
HCT: 39.2 % (ref 39.0–52.0)
Hemoglobin: 13.6 g/dL (ref 13.0–17.0)

## 2012-06-22 NOTE — Care Management Note (Signed)
    Page 1 of 1   06/22/2012     10:28:16 AM   CARE MANAGEMENT NOTE 06/22/2012  Patient:  Bradley French, Bradley French   Account Number:  [id number]  Date Initiated:  06/22/2012  Documentation initiated by:  Letha Cape  Subjective/Objective Assessment:   dx lower gib  admit as observation- lives with spouse, pta independent. [facility]     Action/Plan:   Anticipated DC Date:  06/22/2012   Anticipated DC Plan:  HOME/SELF CARE      DC Planning Services  CM consult      Choice offered to / List presented to:             Status of service:  Completed, signed off Medicare Important Message given?   (If response is "NO", the following Medicare IM given date fields will be blank) Date Medicare IM given:   Date Additional Medicare IM given:    Discharge Disposition:  HOME/SELF CARE  Per UR Regulation:  Reviewed for med. necessity/level of care/duration of stay  If discussed at Long Length of Stay Meetings, dates discussed:    Comments:  06/22/12 10:26 Letha Cape RN, BSN [phone] patient lives with spouse, pta indepdent. Patient is a [place] patient , he gets his medications from the [place].  No needs identified.  Patient for dc today.

## 2012-06-22 NOTE — Progress Notes (Signed)
The patient indicates understanding of these issues and agrees with the plan.

## 2012-06-22 NOTE — Progress Notes (Signed)
     [facility] Daily Rounding Note 06/22/2012, 9:18 AM  SUBJECTIVE:       No further rectal bleeding I reviewed [facility] brought by pt.  No colonoscopy report in these but do note sleep studies dignosing OSA.  Pt now reveals he uses CPAP at home.    OBJECTIVE:         Vital signs in last 24 hours:    Temp:  [98 F (36.7 C)-98.3 F (36.8 C)] 98 F (36.7 C) (01/22 0536) Pulse Rate:  [71-73] 71  (01/22 0536) Resp:  [18-20] 18  (01/22 0536) BP: (134-152)/(78-81) 134/78 mmHg (01/22 0536) SpO2:  [97 %-98 %] 97 % (01/22 0536) Last BM Date: 06/21/12 General: looks well   Heart: RRR Chest: clear Abdomen: NT, Soft, active Bs  Extremities: no edema Neuro/Psych:  Oriented x 3.    Intake/Output from previous day: 01/21 0701 - 01/22 0700 In: 380 [P.O.:380] Out: 2 [Urine:2]  Intake/Output this shift:    Lab Results:  Basename 06/22/12 0705 06/21/12 0555 06/20/12 1059  WBC -- 6.3 7.4  HGB 13.6 12.7* 14.8  HCT 39.2 37.0* 43.0  PLT -- 224 247   ASSESMENT: * Lower GI bleed. Known diverticulosis, bleed presumed diverticular. Appears to have resolved  * Chronic NSAIDs.  Have been on hold for this admission.   * 2 gram drop in Hgb 1/20, now up 1 gram. Not in need of transfusion.  *  2006 gastric ulcer.  H pylori treated then but was also taking NSAIDs at the time.     PLAN: *  Agree with plans to d/c home *  Advised to stop using Ibuprfen in addition to the Naprosen.      LOS: 2 days   [name]  06/22/2012, 9:18 AM Pager: [phone]

## 2012-06-22 NOTE — Progress Notes (Signed)
Subjective: Feeling good. No recurrent bleeding. Has [facility] - voluminous  Objective: Lab: Lab Results  Component Value Date   WBC 6.3 06/21/2012   HGB 13.6 06/22/2012   HCT 39.2 06/22/2012   MCV 86.4 06/21/2012   PLT 224 06/21/2012   BMET    Component Value Date/Time   NA 140 06/20/2012 1059   K 3.8 06/20/2012 1059   CL 104 06/20/2012 1059   CO2 26 06/20/2012 1059   GLUCOSE 116* 06/20/2012 1059   BUN 17 06/20/2012 1059   CREATININE 0.94 06/20/2012 1059   CALCIUM 9.0 06/20/2012 1059   GFRNONAA 83* 06/20/2012 1059   GFRAA >90 06/20/2012 1059     Imaging:  Scheduled Meds:   . citalopram  5 mg Oral Daily  . nicotine  14 mg Transdermal Daily   Continuous Infusions:  PRN Meds:.acetaminophen, acetaminophen, ondansetron (ZOFRAN) IV, ondansetron   Physical Exam: Filed Vitals:   06/22/12 0536  BP: 134/78  Pulse: 71  Temp: 98 F (36.7 C)  Resp: 18   See DC summary     Assessment/Plan: For d/c home. Follow up in office 10-14 days Dictated # [id number]   Illene Regulus [facility] (o) [phone]; (c) [phone] Call-grp - [facility]  Tele: [phone]  06/22/2012, 8:29 AM

## 2012-06-22 NOTE — Progress Notes (Signed)
Patient discharge teaching given, including activity, diet, follow-up appoints, and medications. Patient verbalized understanding of all discharge instructions. IV access was d/c'd. Vitals are stable. Skin is intact except as charted in most recent assessments. Pt to be escorted out by NT, to be driven home by family. 

## 2012-06-23 NOTE — Discharge Summary (Signed)
Bradley French, JARCHOW NO.:  [id number]  MEDICAL RECORD NO.:  [id number]  LOCATION:  5523                         FACILITY:  [facility]  PHYSICIAN:  Rosalyn Gess. Norins, MD  DATE OF BIRTH:  [birth year]  DATE OF ADMISSION:  06/20/2012 DATE OF DISCHARGE:  06/22/2012                              DISCHARGE SUMMARY   ADMITTING DIAGNOSIS:  Hematochezia.  DISCHARGE DIAGNOSIS:  Hematochezia from presumed lower gastrointestinal bleed.  CONSULTANTS:  Iva Boop, MD,FACG for Ulyess Mort, MD of GI.  PROCEDURES:  The patient had no general x-ray.  HISTORY OF PRESENT ILLNESS:  Bradley French is a 72 year old gentleman followed for multiple medical problems at the [facility].  He does have a history of coronary artery disease and bypass surgery, history of lumbar laminectomy, history of sleep apnea.  The patient does have known diverticulosis per prior colonoscopy.  He has had colonoscopy more recently, but records are not available since they are at the [place].  The patient reports that he had been feeling somewhat ill since Thursday with occasional blood in the stool.  Saturday night, he had several bowel movements with just blood, which he rated at more than 2 cups of blood in the last day.  His last bloody bowel movement was Sunday night, the 19th.  He denied any lightheadedness, dizziness, chest pain, or breathing difficulties.  Because of his hematochezia, he did present to the [facility] Emergency Department and was subsequently admitted for lower GI bleed and observation.  Please see the H and P for past medical history, family history, and social history, which is well documented.  Home medications are documented that include: 1. Aspirin 81 mg daily. 2. Citalopram 10 mg daily. 3. Advil every 6 hours as needed for pain. 4. Lisinopril 10 mg daily. 5. Metoprolol-XL 25 mg b.i.d. 6. Naprosyn.  Physical examination on admission was notable for stable  vital signs. Lungs were clear.  Abdomen was rated a soft and nontender with no guarding or rebound.  HOSPITAL COURSE:  The patient was admitted for observation.  Serial H and H were obtained, which showed the patient to have a stable hemoglobin with admission hemoglobin of 14.8 g and discharge hemoglobin of 13.6 g.  He had no recurrent episodes of hematochezia since admission.  The patient was seen in consultation by Dr. Leone Payor for [place].  It was felt that the patient did not need to have any endoscopy or any other interventions, especially, since there was no recurrent bleeding, and hemoglobin was stable.  Records were requested from the [place] and were pending at time of discharge dictation.  The patient being stable with no recurrent hematochezia, with his hemoglobin being stable at this point he is ready for discharge to home.  DISCHARGE EXAMINATION:  VITAL SIGNS:  Temperature was 98, blood pressure 134/78, pulses 71, respirations 18, oxygen saturation was 97% on room air. GENERAL APPEARANCE:  This is a well-nourished well-developed gentleman, looks his stated age.  No acute distress. HEENT:  Conjunctivae and sclerae were clear. NECK:  Supple. CHEST:  The patient is moving air well with no increased work of breathing. CARDIOVASCULAR:  2+ radial pulses.  Precordium was quiet.  He had a regular rate and rhythm. ABDOMEN:  Soft.  He had positive bowel sounds.  No guarding or rebound was noted. Neurologic:  The patient is awake, alert.  He is oriented.  Speech is clear.  FINAL LABORATORY:  Hemoglobin was 13.6 g on the day of discharge. Additional labs:  The patient had a comprehensive metabolic panel at the time of admission with a sodium of 140, potassium of 3.8, chloride 104, CO2 of 26, BUN 17, creatinine 0.94, glucose was 116.  Liver functions were normal.  DISPOSITION:  The patient is to return home, will continue his home medications.  He was carefully  advised to stop taking nonsteroidal anti- inflammatory drugs, and to rely on Tylenol for pain.  FOLLOWUP:  The patient is asked to come to the office in 10-14 days and will be contacted with an appointment.  He is asked to bring [place] records with him so that his other record can be updated.  The patient's condition at time of discharge dictation is stable.     Rosalyn Gess Norins, MD     MEN/MEDQ  D:  06/22/2012  T:  06/23/2012  Job:  [id number]  cc:   Ulyess Mort, MD

## 2012-06-24 ENCOUNTER — Telehealth: Payer: Self-pay | Admitting: General Practice

## 2012-06-24 NOTE — Telephone Encounter (Signed)
Transitional Care Call: Patient discharged from hospital on 06/22/2012.   Dx:  Hematochezia.  Patient says he is doing well and is symptom free.  Patient denies any question in regard to hospital dc instructions.  Patient denies questions about medications and says that he has all prescribed medications in the home.  Patient says that he has adequate help in the home.  Patient denies any questions for Dr. Debby Bud.  F/U appointment made for 06/29/2012

## 2012-06-29 ENCOUNTER — Encounter: Payer: Self-pay | Admitting: Internal Medicine

## 2012-06-29 ENCOUNTER — Ambulatory Visit: Payer: Non-veteran care | Admitting: Internal Medicine

## 2012-06-29 ENCOUNTER — Telehealth: Payer: Self-pay | Admitting: Internal Medicine

## 2012-06-29 ENCOUNTER — Ambulatory Visit (INDEPENDENT_AMBULATORY_CARE_PROVIDER_SITE_OTHER): Payer: Non-veteran care | Admitting: Internal Medicine

## 2012-06-29 VITALS — BP 120/78 | HR 67 | Temp 98.0°F | Resp 16 | Wt 239.0 lb

## 2012-06-29 DIAGNOSIS — G4733 Obstructive sleep apnea (adult) (pediatric): Secondary | ICD-10-CM | POA: Insufficient documentation

## 2012-06-29 DIAGNOSIS — K922 Gastrointestinal hemorrhage, unspecified: Secondary | ICD-10-CM

## 2012-06-29 DIAGNOSIS — F17200 Nicotine dependence, unspecified, uncomplicated: Secondary | ICD-10-CM

## 2012-06-29 DIAGNOSIS — Z9989 Dependence on other enabling machines and devices: Secondary | ICD-10-CM | POA: Insufficient documentation

## 2012-06-29 MED ORDER — SIMVASTATIN 40 MG PO TABS: 40.0000 mg | ORAL_TABLET | Freq: Every day | ORAL | Status: DC

## 2012-06-29 MED ORDER — RANITIDINE HCL 150 MG PO TABS: 150.0000 mg | ORAL_TABLET | Freq: Two times a day (BID) | ORAL | Status: DC

## 2012-06-29 NOTE — Patient Instructions (Addendum)
1.Diverticular bleed - the bleeding episode you had was most likely from a bleeding diverticulum in the colon. Glad it has stopped. No further evaluation at this time.  2. Gastritis vs early ulcer - the growling hunger pain along with the tenderness on exam suggests irritation of the lining of the stomach. Ibuprofen is a likely cause of this along with the problem of poor circulation to the stomach from smoking  Plan  Stop taking ibuprofen. For pain take Tylenol 500 mg 2 tablets three times a day  Stop smoking  Take Ranitidine 150 mg twice a day for 4 weeks to heal the stomach (Rx to drugstore)  You otherwise seem to be doing ok.   Gastritis, Adult Gastritis is soreness and swelling (inflammation) of the lining of the stomach. Gastritis can develop as a sudden onset (acute) or long-term (chronic) condition. If gastritis is not treated, it can lead to stomach bleeding and ulcers. CAUSES   Gastritis occurs when the stomach lining is weak or damaged. Digestive juices from the stomach then inflame the weakened stomach lining. The stomach lining may be weak or damaged due to viral or bacterial infections. One common bacterial infection is the Helicobacter pylori infection. Gastritis can also result from excessive alcohol consumption, taking certain medicines, or having too much acid in the stomach.   SYMPTOMS   In some cases, there are no symptoms. When symptoms are present, they may include:  Pain or a burning sensation in the upper abdomen.   Nausea.   Vomiting.   An uncomfortable feeling of fullness after eating.  DIAGNOSIS   Your caregiver may suspect you have gastritis based on your symptoms and a physical exam. To determine the cause of your gastritis, your caregiver may perform the following:  Blood or stool tests to check for the H pylori bacterium.   Gastroscopy. A thin, flexible tube (endoscope) is passed down the esophagus and into the stomach. The endoscope has a light and camera  on the end. Your caregiver uses the endoscope to view the inside of the stomach.   Taking a tissue sample (biopsy) from the stomach to examine under a microscope.  TREATMENT   Depending on the cause of your gastritis, medicines may be prescribed. If you have a bacterial infection, such as an H pylori infection, antibiotics may be given. If your gastritis is caused by too much acid in the stomach, H2 blockers or antacids may be given. Your caregiver may recommend that you stop taking aspirin, ibuprofen, or other nonsteroidal anti-inflammatory drugs (NSAIDs). HOME CARE INSTRUCTIONS  Only take over-the-counter or prescription medicines as directed by your caregiver.   If you were given antibiotic medicines, take them as directed. Finish them even if you start to feel better.   Drink enough fluids to keep your urine clear or pale yellow.   Avoid foods and drinks that make your symptoms worse, such as:   Caffeine or alcoholic drinks.   Chocolate.   Peppermint or mint flavorings.   Garlic and onions.   Spicy foods.   Citrus fruits, such as oranges, lemons, or limes.   Tomato-based foods such as sauce, chili, salsa, and pizza.   Fried and fatty foods.   Eat small, frequent meals instead of large meals.  SEEK IMMEDIATE MEDICAL CARE IF:    You have black or dark red stools.   You vomit blood or material that looks like coffee grounds.   You are unable to keep fluids down.   Your abdominal pain  gets worse.   You have a fever.   You do not feel better after 1 week.   You have any other questions or concerns.  MAKE SURE YOU:  Understand these instructions.   Will watch your condition.   Will get help right away if you are not doing well or get worse.  Document Released: 05/12/2001 Document Revised: 11/17/2011 Document Reviewed: 07/01/2011 [facility] Patient Information 2013 [place], [place].

## 2012-06-29 NOTE — Telephone Encounter (Signed)
Mr. Brosseau was seen today.  His wife called to let Dr. Debby Bud know that he is on Simvastatin 80 mg.  He didn't have that one with him at the appt. He has some and does not need a refill at this time.

## 2012-06-29 NOTE — Progress Notes (Signed)
Subjective:    Patient ID: Bradley French, male    DOB: 09/01/1940, 72 y.o.   MRN: [id number]  HPI Bradley French presents for hospital follow up. He was admitted 1/20- 1/22 for hematochezia. He was seen by Dr. Leone Payor for GI. Bleeding stopped spontaneously and he never had a significant drop in hemoglobin. He did not require any procedures. Since d/c he has done well w/o further hemtochezia. He is doing well except for stomach growling and constant hunger pain.  He has had no chest pain or chest discomfort. NO breathing trouble.  Past Medical History  Diagnosis Date  . Hypertension   . Hypercholesteremia   . Colon polyps ???    none on 2006 colonoscopy  . Hemorrhoid   . Diverticulosis of colon   . Gastric ulcer 2006    ? secondary to H Pylori, vs ASA.   Marland Kitchen H pylori ulcer 2006    treated with Prevpac  . Coronary artery disease   . Mental disorder   . COPD (chronic obstructive pulmonary disease)   . OSA on CPAP     diagnosed at [place] in [place]  . Cataract   . Depression     PHQ-9 positive at [facility] - score of 4-6  . GERD (gastroesophageal reflux disease)   . OA (osteoarthritis) of knee     left knee: tricompartmental deterioration. s/p steroid injections  . Benign hematuria June '08    CT abdomen negative; cystoscopy - negative (at [facility])   Past Surgical History  Procedure Date  . Knee surgery     left knee  . Coronary artery bypass graft 2006    2 vessell  . Hemorrhoid surgery 1981  . Hemilaminectomy June '12    L4-5 level due to claudication  . Sternal wire removal '09   Family History  Problem Relation Age of Onset  . Diabetes Brother   . Alcohol abuse Father   . Alcohol abuse Sister   . Cancer Neg Hx   . Heart disease Neg Hx   . Diabetes Brother   . Alcohol abuse Brother    History   Social History  . Marital Status: Married    Spouse Name: N/A    Number of Children: 3  . Years of Education: 11   Occupational History  . sheet metal worker     retired   Social  History Main Topics  . Smoking status: Current Every Day Smoker -- 0.5 packs/day    Types: Cigarettes  . Smokeless tobacco: Never Used  . Alcohol Use: 3.0 oz/week    6 drink(s) per week     Comment: brandy   . Drug Use: No  . Sexually Active: No   Other Topics Concern  . Not on file   Social History Narrative   [employer] - 2 years, 3 years [employer]. 1 tour [place]. PTSD. Married '88  2 sons '69, '70; 1 daughter '71. 2 grandchildren. Sheet Advice worker - retired.    Current Outpatient Prescriptions on File Prior to Visit  Medication Sig Dispense Refill  . aspirin EC 81 MG tablet Take 81 mg by mouth daily.      . citalopram (CELEXA) 10 MG tablet Take 5 mg by mouth daily. Take one-half tablet by mouth once every day.      . ibuprofen (ADVIL,MOTRIN) 200 MG tablet Take 200 mg by mouth every 6 (six) hours as needed. For pain.      Marland Kitchen lisinopril (PRINIVIL,ZESTRIL) 20 MG  tablet Take 10 mg by mouth daily. Take one-half tab by mouth once every day for blood pressure.      . metoprolol succinate (TOPROL-XL) 25 MG 24 hr tablet Take 25 mg by mouth 2 (two) times daily.          Review of Systems System review is negative for any constitutional, cardiac, pulmonary, GI or neuro symptoms or complaints other than as described in the HPI.     Objective:   Physical Exam Filed Vitals:   06/29/12 1331  BP: 120/78  Pulse: 67  Temp: 98 F (36.7 C)  Resp: 16   Wt Readings from Last 3 Encounters:  06/29/12 239 lb (108.41 kg)  06/20/12 228 lb 14.4 oz (103.828 kg)  12/26/04 238 lb (107.956 kg)   Gen'l - WNWD AA man in no distress. HEENT- normal Cor 2+ radial, RRR Pulm - normal respirations Abd - BS + x 4, very tender at the epigastrium, no guarding or rebound      Assessment & Plan:

## 2012-07-02 ENCOUNTER — Encounter: Payer: Self-pay | Admitting: Internal Medicine

## 2012-07-02 NOTE — Assessment & Plan Note (Signed)
Hospitalized Jan 20-22, '14 for hematochezia, presumed diverticular bleed. Reviewed previous colonoscopy. He has not had any recurrent bleeding since being home. He has been asymptomatic.  Plan No further evaluation unless he has recurrent bleeding.

## 2012-07-02 NOTE — Assessment & Plan Note (Signed)
Continues to smoke. He is advised to stop smoking, especially in light of his CAD and PUD

## 2012-07-04 MED ORDER — SIMVASTATIN 80 MG PO TABS: 80.0000 mg | ORAL_TABLET | Freq: Every day | ORAL | Status: DC

## 2012-07-04 NOTE — Telephone Encounter (Signed)
Please read note below

## 2012-07-22 DIAGNOSIS — F17200 Nicotine dependence, unspecified, uncomplicated: Secondary | ICD-10-CM

## 2012-07-22 DIAGNOSIS — G4733 Obstructive sleep apnea (adult) (pediatric): Secondary | ICD-10-CM

## 2012-07-22 DIAGNOSIS — K922 Gastrointestinal hemorrhage, unspecified: Secondary | ICD-10-CM

## 2012-09-27 ENCOUNTER — Ambulatory Visit: Payer: Non-veteran care | Admitting: Internal Medicine

## 2013-10-03 ENCOUNTER — Emergency Department (HOSPITAL_COMMUNITY): Payer: Medicare Other

## 2013-10-03 ENCOUNTER — Encounter (HOSPITAL_COMMUNITY): Payer: Self-pay | Admitting: Emergency Medicine

## 2013-10-03 ENCOUNTER — Inpatient Hospital Stay (HOSPITAL_COMMUNITY): Payer: Medicare Other

## 2013-10-03 ENCOUNTER — Inpatient Hospital Stay (HOSPITAL_COMMUNITY)
Admission: EM | Admit: 2013-10-03 | Discharge: 2013-10-07 | DRG: 558 | Disposition: A | Discharge status: Home / Self Care | Payer: Medicare Other | Attending: Internal Medicine | Admitting: Internal Medicine

## 2013-10-03 DIAGNOSIS — M6282 Rhabdomyolysis: Principal | ICD-10-CM | POA: Diagnosis present

## 2013-10-03 DIAGNOSIS — Y92009 Unspecified place in unspecified non-institutional (private) residence as the place of occurrence of the external cause: Secondary | ICD-10-CM | POA: Diagnosis not present

## 2013-10-03 DIAGNOSIS — F1010 Alcohol abuse, uncomplicated: Secondary | ICD-10-CM | POA: Diagnosis present

## 2013-10-03 DIAGNOSIS — J449 Chronic obstructive pulmonary disease, unspecified: Secondary | ICD-10-CM | POA: Diagnosis present

## 2013-10-03 DIAGNOSIS — I2510 Atherosclerotic heart disease of native coronary artery without angina pectoris: Secondary | ICD-10-CM | POA: Diagnosis present

## 2013-10-03 DIAGNOSIS — R7402 Elevation of levels of lactic acid dehydrogenase (LDH): Secondary | ICD-10-CM | POA: Diagnosis present

## 2013-10-03 DIAGNOSIS — R740 Nonspecific elevation of levels of transaminase and lactic acid dehydrogenase [LDH]: Secondary | ICD-10-CM

## 2013-10-03 DIAGNOSIS — Z9181 History of falling: Secondary | ICD-10-CM | POA: Diagnosis not present

## 2013-10-03 DIAGNOSIS — M1710 Unilateral primary osteoarthritis, unspecified knee: Secondary | ICD-10-CM | POA: Diagnosis present

## 2013-10-03 DIAGNOSIS — F17200 Nicotine dependence, unspecified, uncomplicated: Secondary | ICD-10-CM

## 2013-10-03 DIAGNOSIS — Z7982 Long term (current) use of aspirin: Secondary | ICD-10-CM | POA: Diagnosis not present

## 2013-10-03 DIAGNOSIS — E7800 Pure hypercholesterolemia, unspecified: Secondary | ICD-10-CM | POA: Diagnosis present

## 2013-10-03 DIAGNOSIS — F329 Major depressive disorder, single episode, unspecified: Secondary | ICD-10-CM | POA: Diagnosis present

## 2013-10-03 DIAGNOSIS — R42 Dizziness and giddiness: Secondary | ICD-10-CM | POA: Diagnosis present

## 2013-10-03 DIAGNOSIS — Z951 Presence of aortocoronary bypass graft: Secondary | ICD-10-CM | POA: Diagnosis not present

## 2013-10-03 DIAGNOSIS — R7401 Elevation of levels of liver transaminase levels: Secondary | ICD-10-CM | POA: Diagnosis present

## 2013-10-03 DIAGNOSIS — D45 Polycythemia vera: Secondary | ICD-10-CM | POA: Diagnosis present

## 2013-10-03 DIAGNOSIS — E785 Hyperlipidemia, unspecified: Secondary | ICD-10-CM | POA: Diagnosis present

## 2013-10-03 DIAGNOSIS — J4489 Other specified chronic obstructive pulmonary disease: Secondary | ICD-10-CM | POA: Diagnosis present

## 2013-10-03 DIAGNOSIS — K219 Gastro-esophageal reflux disease without esophagitis: Secondary | ICD-10-CM | POA: Diagnosis present

## 2013-10-03 DIAGNOSIS — Z9889 Other specified postprocedural states: Secondary | ICD-10-CM

## 2013-10-03 DIAGNOSIS — Z9989 Dependence on other enabling machines and devices: Secondary | ICD-10-CM

## 2013-10-03 DIAGNOSIS — I10 Essential (primary) hypertension: Secondary | ICD-10-CM | POA: Diagnosis present

## 2013-10-03 DIAGNOSIS — T466X5A Adverse effect of antihyperlipidemic and antiarteriosclerotic drugs, initial encounter: Secondary | ICD-10-CM | POA: Diagnosis present

## 2013-10-03 DIAGNOSIS — R9089 Other abnormal findings on diagnostic imaging of central nervous system: Secondary | ICD-10-CM

## 2013-10-03 DIAGNOSIS — G4733 Obstructive sleep apnea (adult) (pediatric): Secondary | ICD-10-CM | POA: Diagnosis present

## 2013-10-03 DIAGNOSIS — K922 Gastrointestinal hemorrhage, unspecified: Secondary | ICD-10-CM

## 2013-10-03 DIAGNOSIS — F3289 Other specified depressive episodes: Secondary | ICD-10-CM | POA: Diagnosis present

## 2013-10-03 DIAGNOSIS — R29898 Other symptoms and signs involving the musculoskeletal system: Secondary | ICD-10-CM | POA: Diagnosis present

## 2013-10-03 DIAGNOSIS — R9389 Abnormal findings on diagnostic imaging of other specified body structures: Secondary | ICD-10-CM

## 2013-10-03 DIAGNOSIS — R319 Hematuria, unspecified: Secondary | ICD-10-CM

## 2013-10-03 DIAGNOSIS — Z8711 Personal history of peptic ulcer disease: Secondary | ICD-10-CM

## 2013-10-03 DIAGNOSIS — Z8719 Personal history of other diseases of the digestive system: Secondary | ICD-10-CM

## 2013-10-03 DIAGNOSIS — K5730 Diverticulosis of large intestine without perforation or abscess without bleeding: Secondary | ICD-10-CM

## 2013-10-03 LAB — BASIC METABOLIC PANEL
BUN: 12 mg/dL (ref 6–23)
CALCIUM: 9.2 mg/dL (ref 8.4–10.5)
CO2: 25 mEq/L (ref 19–32)
CREATININE: 0.76 mg/dL (ref 0.50–1.35)
Chloride: 105 mEq/L (ref 96–112)
GFR calc Af Amer: >90 mL/min (ref >90)
GFR, EST NON AFRICAN AMERICAN: 90 mL/min — AB (ref >90)
GLUCOSE: 140 mg/dL — AB (ref 70–99)
Potassium: 5.1 mEq/L (ref 3.7–5.3)
SODIUM: 146 meq/L (ref 137–147)

## 2013-10-03 LAB — URINE MICROSCOPIC-ADD ON

## 2013-10-03 LAB — CBC
HCT: 49.3 % (ref 39.0–52.0)
HEMOGLOBIN: 17.2 g/dL — AB (ref 13.0–17.0)
MCH: 30.7 pg (ref 26.0–34.0)
MCHC: 34.9 g/dL (ref 30.0–36.0)
MCV: 88 fL (ref 78.0–100.0)
Platelets: 219 10*3/uL (ref 150–400)
RBC: 5.6 MIL/uL (ref 4.22–5.81)
RDW: 13.7 % (ref 11.5–15.5)
WBC: 7.4 10*3/uL (ref 4.0–10.5)

## 2013-10-03 LAB — POC OCCULT BLOOD, ED: FECAL OCCULT BLD: NEGATIVE

## 2013-10-03 LAB — URINALYSIS, ROUTINE W REFLEX MICROSCOPIC
Glucose, UA: NEGATIVE mg/dL
KETONES UR: 40 mg/dL — AB
Nitrite: POSITIVE — AB
PROTEIN: 100 mg/dL — AB
Specific Gravity, Urine: 1.025 (ref 1.005–1.030)
UROBILINOGEN UA: 1 mg/dL (ref 0.0–1.0)
pH: 5 (ref 5.0–8.0)

## 2013-10-03 MED ORDER — DEXTROSE 5 % IV SOLN
1.0000 g | Freq: Once | INTRAVENOUS | Status: AC
  Administered 2013-10-03: 1 g via INTRAVENOUS
  Filled 2013-10-03: qty 10

## 2013-10-03 MED ORDER — FAMOTIDINE 20 MG PO TABS
20.0000 mg | ORAL_TABLET | Freq: Two times a day (BID) | ORAL | Status: DC
Start: 2013-10-03 — End: 2013-10-07
  Administered 2013-10-04 – 2013-10-07 (×8): 20 mg via ORAL
  Filled 2013-10-03 (×10): qty 1

## 2013-10-03 MED ORDER — PANTOPRAZOLE SODIUM 40 MG IV SOLR
40.0000 mg | INTRAVENOUS | Status: DC
  Administered 2013-10-04: 40 mg via INTRAVENOUS
  Filled 2013-10-03 (×2): qty 40

## 2013-10-03 MED ORDER — SIMVASTATIN 80 MG PO TABS
80.0000 mg | ORAL_TABLET | Freq: Every day | ORAL | Status: DC
  Administered 2013-10-04: 80 mg via ORAL
  Filled 2013-10-03 (×2): qty 1

## 2013-10-03 MED ORDER — SENNOSIDES-DOCUSATE SODIUM 8.6-50 MG PO TABS
1.0000 | ORAL_TABLET | Freq: Every evening | ORAL | Status: DC | PRN
  Filled 2013-10-03: qty 1

## 2013-10-03 MED ORDER — DEXTROSE 5 % IV SOLN
1.0000 g | INTRAVENOUS | Status: AC
  Administered 2013-10-04 – 2013-10-05 (×2): 1 g via INTRAVENOUS
  Filled 2013-10-03 (×2): qty 10

## 2013-10-03 MED ORDER — VITAMIN B-12 1000 MCG PO TABS
1000.0000 ug | ORAL_TABLET | Freq: Every day | ORAL | Status: DC
Start: 2013-10-04 — End: 2013-10-07
  Administered 2013-10-04 – 2013-10-07 (×4): 1000 ug via ORAL
  Filled 2013-10-03 (×4): qty 1

## 2013-10-03 MED ORDER — METOPROLOL SUCCINATE ER 25 MG PO TB24
25.0000 mg | ORAL_TABLET | Freq: Two times a day (BID) | ORAL | Status: DC
  Administered 2013-10-04 – 2013-10-07 (×8): 25 mg via ORAL
  Filled 2013-10-03 (×10): qty 1

## 2013-10-03 MED ORDER — SODIUM CHLORIDE 0.9 % IV SOLN
INTRAVENOUS | Status: DC
  Administered 2013-10-04 (×2): via INTRAVENOUS

## 2013-10-03 MED ORDER — CITALOPRAM HYDROBROMIDE 10 MG PO TABS
5.0000 mg | ORAL_TABLET | Freq: Every day | ORAL | Status: DC
  Administered 2013-10-04 – 2013-10-07 (×4): 5 mg via ORAL
  Filled 2013-10-03 (×4): qty 1

## 2013-10-03 MED ORDER — LISINOPRIL 10 MG PO TABS
10.0000 mg | ORAL_TABLET | Freq: Every day | ORAL | Status: DC
  Administered 2013-10-04: 10 mg via ORAL
  Filled 2013-10-03: qty 1

## 2013-10-03 MED ORDER — ASPIRIN EC 81 MG PO TBEC
81.0000 mg | DELAYED_RELEASE_TABLET | Freq: Every day | ORAL | Status: DC
  Administered 2013-10-04 – 2013-10-07 (×4): 81 mg via ORAL
  Filled 2013-10-03 (×4): qty 1

## 2013-10-03 MED ORDER — IOHEXOL 300 MG/ML  SOLN
25.0000 mL | Freq: Once | INTRAMUSCULAR | Status: AC | PRN
  Administered 2013-10-03: 25 mL via ORAL

## 2013-10-03 MED ORDER — IOHEXOL 300 MG/ML  SOLN
80.0000 mL | Freq: Once | INTRAMUSCULAR | Status: AC | PRN
  Administered 2013-10-03: 80 mL via INTRAVENOUS

## 2013-10-03 NOTE — ED Notes (Signed)
Pt reports ongoing dizziness for over a week. States also has generalized weakness from waist down since then as well. Reports pain to legs with walking.

## 2013-10-03 NOTE — ED Notes (Signed)
Pt knows that urine is needed, pt was given a urinal

## 2013-10-03 NOTE — Consult Note (Signed)
Reason for Consult:  bilateral lower extremity leg weakness Referring Physician: Dr. Collier Bullock  Bradley French is an 73 y.o. male.  HPI: Patient is a 73 year old individual who noticed the onset of weakness in his lower extremities a little over week ago. He states that about 3 weeks ago he had a flulike syndrome involving a GI bug. This gradually got better but then he started developed weakness in his legs he notes that now he has some difficulty standing onto his legs to move about. Is seen in the emergency department where an MRI of the thoracic spine condition to the lumbar spine was performed. The thoracic spine study demonstrates that the patient has no evidence of cord compression at any level as was described as slight kinking of the spinal cord at the T4 level this does not appear to be associated with a compressive phenomenon.  The MRI of the lumbar spine demonstrates that the patient has evidence of some spinal stenosis worst at C. at L4-L5 we've with that it is not appear severe enough to cause difficulties with weakness in both lower extremities.  Past Medical History  Diagnosis Date  . Hypertension   . Hypercholesteremia   . Colon polyps ???    none on 2006 colonoscopy  . Hemorrhoid   . Diverticulosis of colon   . Gastric ulcer 2006    ? secondary to H Pylori, vs ASA.   Marland Kitchen H pylori ulcer 2006    treated with Prevpac  . Coronary artery disease   . Mental disorder   . COPD (chronic obstructive pulmonary disease)   . OSA on CPAP     diagnosed at [place] in [place]  . Cataract   . Depression     PHQ-9 positive at [facility] - score of 4-6  . GERD (gastroesophageal reflux disease)   . OA (osteoarthritis) of knee     left knee: tricompartmental deterioration. s/p steroid injections  . Benign hematuria June '08    CT abdomen negative; cystoscopy - negative (at [facility])    Past Surgical History  Procedure Laterality Date  . Knee surgery      left knee  . 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 Hemorrhoid surgery  1981  . Hemilaminectomy  June '12    L4-5 level due to claudication  . Sternal wire removal  '09    Family History  Problem Relation Age of Onset  . Diabetes Brother   . Alcohol abuse Father   . Alcohol abuse Sister   . Cancer Neg Hx   . Heart disease Neg Hx   . Diabetes Brother   . Alcohol abuse Brother     Social History:  reports that he has been smoking Cigarettes.  He has been smoking about 0.50 packs per day. He has never used smokeless tobacco. He reports that he drinks about 3 ounces of alcohol per week. He reports that he does not use illicit drugs.  Allergies:  Allergies  Allergen Reactions  . Sulfonamide Derivatives Rash    Medications: I have reviewed the patient's current medications.  Results for orders placed during the hospital encounter of 10/03/13 (from the past 48 hour(s))  CBC     Status: Abnormal   Collection Time    10/03/13 10:18 AM      Result Value Ref Range   WBC 7.4  4.0 - 10.5 K/uL   RBC 5.60  4.22 - 5.81 MIL/uL   Hemoglobin  17.2 (*) 13.0 - 17.0 g/dL   HCT 49.3  39.0 - 52.0 %   MCV 88.0  78.0 - 100.0 fL   MCH 30.7  26.0 - 34.0 pg   MCHC 34.9  30.0 - 36.0 g/dL   RDW 13.7  11.5 - 15.5 %   Platelets 219  150 - 400 K/uL  BASIC METABOLIC PANEL     Status: Abnormal   Collection Time    10/03/13 10:18 AM      Result Value Ref Range   Sodium 146  137 - 147 mEq/L   Potassium 5.1  3.7 - 5.3 mEq/L   Chloride 105  96 - 112 mEq/L   CO2 25  19 - 32 mEq/L   Glucose, Bld 140 (*) 70 - 99 mg/dL   BUN 12  6 - 23 mg/dL   Creatinine, Ser 0.76  0.50 - 1.35 mg/dL   Calcium 9.2  8.4 - 10.5 mg/dL   GFR calc non Af Amer 90 (*) >90 mL/min   GFR calc Af Amer >90  >90 mL/min   Comment: (NOTE)     The eGFR has been calculated using the CKD EPI equation.     This calculation has not been validated in all clinical situations.     eGFR's persistently <90 mL/min signify possible Chronic Kidney     Disease.  URINALYSIS,  ROUTINE W REFLEX MICROSCOPIC     Status: Abnormal   Collection Time    10/03/13  2:29 PM      Result Value Ref Range   Color, Urine AMBER (*) YELLOW   Comment: BIOCHEMICALS MAY BE AFFECTED BY COLOR   APPearance TURBID (*) CLEAR   Specific Gravity, Urine 1.025  1.005 - 1.030   pH 5.0  5.0 - 8.0   Glucose, UA NEGATIVE  NEGATIVE mg/dL   Hgb urine dipstick LARGE (*) NEGATIVE   Comment: REPEATED TO VERIFY.   Bilirubin Urine MODERATE (*) NEGATIVE   Ketones, ur 40 (*) NEGATIVE mg/dL   Protein, ur 100 (*) NEGATIVE mg/dL   Urobilinogen, UA 1.0  0.0 - 1.0 mg/dL   Nitrite POSITIVE (*) NEGATIVE   Leukocytes, UA SMALL (*) NEGATIVE  URINE MICROSCOPIC-ADD ON     Status: Abnormal   Collection Time    10/03/13  2:29 PM      Result Value Ref Range   Squamous Epithelial / LPF RARE  RARE   WBC, UA 0-2  <3 WBC/hpf   RBC / HPF 0-2  <3 RBC/hpf   Bacteria, UA FEW (*) RARE   Casts GRANULAR CAST (*) NEGATIVE   Urine-Other AMORPHOUS URATES/PHOSPHATES    POC OCCULT BLOOD, ED     Status: None   Collection Time    10/03/13  7:57 PM      Result Value Ref Range   Fecal Occult Bld NEGATIVE  NEGATIVE    Ct Head Wo Contrast  10/03/2013   CLINICAL DATA:  Dizziness with weakness from waist down.  EXAM: CT HEAD WITHOUT CONTRAST  TECHNIQUE: Contiguous axial images were obtained from the base of the skull through the vertex without intravenous contrast.  COMPARISON:  None.  FINDINGS: Skull and Sinuses:No acute osseous findings. Clear mastoids. Symmetric IACs.  Orbits: No acute abnormality.  Brain: No evidence of acute abnormality, such as acute large vessel infarction, hemorrhage, hydrocephalus, or mass lesion/mass effect. There is cerebral volume loss which is age commensurate. Subtle linear low-density in the mid left cerebellum, conspicuity consistent with established small vessel infarct.  IMPRESSION: 1. No acute intracranial findings. 2. 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   Electronically  Signed   By: Jorje Guild M.D.   On: 10/03/2013 22:00   Mr Thoracic Spine Wo Contrast  10/03/2013   CLINICAL DATA:  Low back pain radiating down both legs for several weeks. Bilateral lower extremity weakness. Falls.  EXAM: MRI THORACIC AND LUMBAR SPINE WITHOUT CONTRAST  TECHNIQUE: Multiplanar and multiecho pulse sequences of the thoracic and lumbar spine were obtained without intravenous contrast.  COMPARISON:  CT abdomen and pelvis 10/03/2013. Chest radiographs 08/19/2004. Lumbar spine MRI 05/23/2003  FINDINGS: MR THORACIC SPINE FINDINGS  Vertebral alignment is normal. Vertebral body heights are preserved without compression fracture. Bone marrow is diffusely heterogeneous with areas of mildly diminished T1 signal. No vertebral marrow edema is seen. At T4-5, there is the suggestion of slight kinking and ventral displacement of the spinal cord (series 7, image 7 and series 8, image 12). Just below this, minimal T2 hyperintense signal is questioned in the spinal cord at the T5 level.  Prominent dorsal epidural fat is present throughout the mid thoracic spine. Multilevel right-sided anterior osteophytosis is present in the mid to lower thoracic spine. No posterior disc herniation or spinal canal or neural foraminal stenosis is identified. Paraspinal soft tissues are unremarkable.  MR LUMBAR SPINE FINDINGS  The same numbering employed on the prior lumbar spine MRI will be continued on this examination, with the last full intervertebral disc space being labeled L5-S1.  Vertebral body heights are preserved without compression fracture. As in the thoracic spine, vertebral bone marrow signal is mildly heterogeneous diffusely. No vertebral marrow edema is seen. Mild disc desiccation is present at L4-5 with at most minimal disc space narrowing. Other intervertebral disc space heights are preserved. Conus medullaris terminates at L1. Prominent dorsal epidural fat is present in the mid and upper lumbar spine. Visualized  paraspinal soft tissues are unremarkable.  L1-2 and L2-3:  Negative.  L3-4: Mild disc bulge and facet and ligamentum flavum hypertrophy results in mild spinal canal narrowing, unchanged. No neural foraminal stenosis.  L4-5: Sequelae of interval laminectomy are identified. Central disc protrusion has increased from the prior study. Together with hypertrophy of the facets and residual posterior ligaments, this results in severe spinal stenosis. There is mild right neural foraminal stenosis.  L5-S1:  Negative.  IMPRESSION: 1. 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 2. No thoracic disc herniation or spinal stenosis. 3. Possible slight kinking and anterior displacement of the spinal cord at T4-5. While this is subtle and the cord is not clearly atrophic at this level, spinal cord herniation is not excluded. Consider further evaluation with CT myelography as clinically indicated. 4. Diffusely heterogeneous bone marrow signal. Although this can be caused by marrow infiltrative processes, the most common causes include anemia, smoking, obesity, or advancing age.   Electronically Signed   By: Logan Bores   On: 10/03/2013 19:55   Mr Lumbar Spine Wo Contrast  10/03/2013   CLINICAL DATA:  Low back pain radiating down both legs for several weeks. Bilateral lower extremity weakness. Falls.  EXAM: MRI THORACIC AND LUMBAR SPINE WITHOUT CONTRAST  TECHNIQUE: Multiplanar and multiecho pulse sequences of the thoracic and lumbar spine were obtained without intravenous contrast.  COMPARISON:  CT abdomen and pelvis 10/03/2013. Chest radiographs 08/19/2004. Lumbar spine MRI 05/23/2003  FINDINGS: MR THORACIC SPINE FINDINGS  Vertebral alignment is normal. Vertebral body heights are preserved  without compression fracture. Bone marrow is diffusely heterogeneous with areas of mildly diminished T1 signal. No vertebral marrow edema is seen. At T4-5, there is the suggestion of slight  kinking and ventral displacement of the spinal cord (series 7, image 7 and series 8, image 12). Just below this, minimal T2 hyperintense signal is questioned in the spinal cord at the T5 level.  Prominent dorsal epidural fat is present throughout the mid thoracic spine. Multilevel right-sided anterior osteophytosis is present in the mid to lower thoracic spine. No posterior disc herniation or spinal canal or neural foraminal stenosis is identified. Paraspinal soft tissues are unremarkable.  MR LUMBAR SPINE FINDINGS  The same numbering employed on the prior lumbar spine MRI will be continued on this examination, with the last full intervertebral disc space being labeled L5-S1.  Vertebral body heights are preserved without compression fracture. As in the thoracic spine, vertebral bone marrow signal is mildly heterogeneous diffusely. No vertebral marrow edema is seen. Mild disc desiccation is present at L4-5 with at most minimal disc space narrowing. Other intervertebral disc space heights are preserved. Conus medullaris terminates at L1. Prominent dorsal epidural fat is present in the mid and upper lumbar spine. Visualized paraspinal soft tissues are unremarkable.  L1-2 and L2-3:  Negative.  L3-4: Mild disc bulge and facet and ligamentum flavum hypertrophy results in mild spinal canal narrowing, unchanged. No neural foraminal stenosis.  L4-5: Sequelae of interval laminectomy are identified. Central disc protrusion has increased from the prior study. Together with hypertrophy of the facets and residual posterior ligaments, this results in severe spinal stenosis. There is mild right neural foraminal stenosis.  L5-S1:  Negative.  IMPRESSION: 1. 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 2. No thoracic disc herniation or spinal stenosis. 3. Possible slight kinking and anterior displacement of the spinal cord at T4-5. While this is subtle and the cord is not clearly  atrophic at this level, spinal cord herniation is not excluded. Consider further evaluation with CT myelography as clinically indicated. 4. Diffusely heterogeneous bone marrow signal. Although this can be caused by marrow infiltrative processes, the most common causes include anemia, smoking, obesity, or advancing age.   Electronically Signed   By: Logan Bores   On: 10/03/2013 19:55   Ct Abdomen Pelvis W Contrast  10/03/2013   CLINICAL DATA:  Dizziness, weakness  EXAM: CT ABDOMEN AND PELVIS WITH CONTRAST  TECHNIQUE: Multidetector CT imaging of the abdomen and pelvis was performed using the standard protocol following bolus administration of intravenous contrast.  CONTRAST:  23m OMNIPAQUE IOHEXOL 300 MG/ML  SOLN  COMPARISON:  None  FINDINGS: Lung bases are clear.  No pericardial fluid.  No focal hepatic lesion. Gallbladder, pancreas, spleen, adrenal glands, and kidneys are normal.  The stomach, small bowel, appendix, and cecum are normal. The colon demonstrates several scattered diverticula without acute inflammation. Rectum is normal.  Abdominal aorta is normal caliber. No retroperitoneal or periportal lymphadenopathy.  There is mild haziness to the central mesentery with several small mesenteric lymph nodes (image 42, series 2 for example).  There is no ventral hernia. Bilateral fat filled inguinal hernias are noted. No free fluid the pelvis. The prostate gland and bladder normal. No pelvic lymphadenopathy. No aggressive osseous lesion.  : IMPRESSION:  1. No acute findings in the abdomen or pelvis. 2. Scattered colonic diverticula without evidence of diverticulosis. 3. 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   Electronically Signed   By: Suzy Bouchard M.D.   On: 10/03/2013 16:00    Review of Systems  Constitutional: Negative.   Eyes: Negative.   Respiratory: Negative.   Cardiovascular: Negative.   Musculoskeletal:       Bilateral lower  extremity weakness in the past week  Skin: Negative.   Neurological:       Weakness without numbness in the both lower extremities upper extremities uninvolved.  Psychiatric/Behavioral: Negative.    Blood pressure 162/71, pulse 94, temperature 98.1 F (36.7 C), temperature source Oral, resp. rate 22, height 6' 4"  (1.93 m), weight 105.6 kg (232 lb 12.9 oz), SpO2 98.00%. Physical Exam  Constitutional: He appears well-developed and well-nourished.  HENT:  Head: Normocephalic and atraumatic.  Eyes: Conjunctivae and EOM are normal. Pupils are equal, round, and reactive to light.  Neck: Normal range of motion. Neck supple.  GI: Soft. Bowel sounds are normal.  Musculoskeletal:  Weakness of both lower extremities with iliopsoas quadricep tibialis anterior and gastrocs rated at 4/5. Sensation appears intact in both distal lower extremities.  Skin: Skin is warm and dry.  Psychiatric: He has a normal mood and affect. His behavior is normal. Judgment and thought content normal.    Assessment/Plan: Progressive weakness in lower extremities rated 4/5 with difficulty walking. It does not appear to be any involvement of the upper extremities. Strength reflexes and sensation has remained normal. I believe that his condition should be observed and other sources for the weakness should be explored the patient has generally had good health. Workup may require EMG nerve conduction study of the lower extremities. Will follow with you  Kristeen Miss 10/03/2013, 11:33 PM

## 2013-10-03 NOTE — H&P (Signed)
[facility] History and Physical  Bradley French [id number] DOB: Apr 21, 1941 DOA: 10/03/2013  Referring physician: ER physician. PCP: Pcp Not In System   Chief Complaint: Lower extremity weakness.  HPI: Bradley French is a 73 y.o. male history of CAD status post CABG, OSA, hypertension and hyperlipidemia, history of GI bleed was brought to the ER patient was found to be having increasing weakness of the lower extremity. Patient has been experiencing weakness over the last few weeks but since last week it has increased further and patient has had at least 2 falls. Denies having hit his head or loss consciousness. Denies having any incontinence of urine or bowels. Patient states he is able to move his lower extremities but is unable to raise it above. Denies any tingling numbness. 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 MRI of the T-spine and L-spine were done which shows kinking at T4-5 level and also spinal stenosis at L4-5 level. Neurosurgeon on call Dr. Raford Pitcher was consulted and felt patient's weakness could not be from the kinking at T4-5. CT head was negative pending acute. Patient is able to move his upper extremities without any difficulty and has had no difficulty speaking swallowing or any visual problems. Patient denies any chest pain or shortness of breath. Denies any abdominal pain nausea vomiting diarrhea. Patient does have mild low back pain after fall.   Review of Systems: As presented in the history of presenting illness, rest negative.  Past Medical History  Diagnosis Date  . Hypertension   . Hypercholesteremia   . Colon polyps ???    none on 2006 colonoscopy  . Hemorrhoid   . Diverticulosis of colon   . Gastric ulcer 2006    ? secondary to H Pylori, vs ASA.   Marland Kitchen H pylori ulcer 2006    treated with Prevpac  . Coronary artery disease   . Mental disorder   . COPD (chronic  obstructive pulmonary disease)   . OSA on CPAP     diagnosed at [place]  . Cataract   . Depression     PHQ-9 positive at [facility] - score of 4-6  . GERD (gastroesophageal reflux disease)   . OA (osteoarthritis) of knee     left knee: tricompartmental deterioration. s/p steroid injections  . Benign hematuria June '08    CT abdomen negative; cystoscopy - negative (at [facility])   Past Surgical History  Procedure Laterality Date  . Knee surgery      left knee  . 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 Hemorrhoid surgery  1981  . Hemilaminectomy  June '12    L4-5 level due to claudication  . Sternal wire removal  '09   Social History:  reports that he has been smoking Cigarettes.  He has been smoking about 0.50 packs per day. He has never used smokeless tobacco. He reports that he drinks about 3 ounces of alcohol per week. He reports that he does not use illicit drugs. Where does patient live home. Can patient participate in ADLs? Yes.  Allergies  Allergen Reactions  . Sulfonamide Derivatives Rash    Family History:  Family History  Problem Relation Age of Onset  . Diabetes Brother   . Alcohol abuse Father   . Alcohol abuse Sister   . Cancer Neg Hx   . Heart disease Neg Hx   . Diabetes Brother   .  Alcohol abuse Brother       Prior to Admission medications   Medication Sig Start Date End Date Taking? Authorizing Provider  aspirin EC 81 MG tablet Take 81 mg by mouth daily.   Yes Historical Provider, MD  Aspirin-Caffeine (BAYER BACK & BODY PAIN EX ST PO) Take 2 tablets by mouth daily as needed (pain).   Yes Historical Provider, MD  citalopram (CELEXA) 10 MG tablet Take 5 mg by mouth daily. Take one-half tablet by mouth once every day.   Yes Historical Provider, MD  ibuprofen (ADVIL,MOTRIN) 200 MG tablet Take 200 mg by mouth every 6 (six) hours as needed. For pain.   Yes Historical Provider, MD  lisinopril (PRINIVIL,ZESTRIL) 20 MG tablet Take 10 mg by mouth daily. Take  one-half tab by mouth once every day for blood pressure.   Yes Historical Provider, MD  metoprolol succinate (TOPROL-XL) 25 MG 24 hr tablet Take 25 mg by mouth 2 (two) times daily.   Yes Historical Provider, MD  ranitidine (ZANTAC) 150 MG tablet Take 1 tablet (150 mg total) by mouth 2 (two) times daily. 06/29/12  Yes Neena Rhymes, MD  simvastatin (ZOCOR) 80 MG tablet Take 1 tablet (80 mg total) by mouth at bedtime. 07/04/12  Yes Neena Rhymes, MD  vitamin B-12 (CYANOCOBALAMIN) 1000 MCG tablet Take 1,000 mcg by mouth daily.   Yes Historical Provider, MD    Physical Exam: Filed Vitals:   10/03/13 2030 10/03/13 2056 10/03/13 2130 10/03/13 2250  BP: 149/67  161/82 162/71  Pulse: 85  90 94  Temp:  97.7 F (36.5 C)  98.1 F (36.7 C)  TempSrc:    Oral  Resp: 20  19 22   Height:    6\' 4"  (1.93 m)  Weight:    105.6 kg (232 lb 12.9 oz)  SpO2: 95%  95% 98%     General:  Well-developed well-nourished.  Eyes: Anicteric no pallor.  ENT: No discharge from the ears eyes nose mouth.  Neck: No mass felt.  Cardiovascular: S1-S2 heard.  Respiratory: No rhonchi or crepitations.  Abdomen: Soft nontender bowel sounds present. No guarding or rigidity.  Skin: No rash.  Musculoskeletal: No muscle tenderness or edema. No joint effusions.  Psychiatric: Appears normal.  Neurologic: Alert awake oriented to time place and person. Patient has difficulty moving his both lower extremities above the horizontal. He is able to move all extremities otherwise. No facial asymmetry. Tongue is midline.  Labs on Admission:  Basic Metabolic Panel:  Recent Labs Lab 10/03/13 1018  NA 146  K 5.1  CL 105  CO2 25  GLUCOSE 140*  BUN 12  CREATININE 0.76  CALCIUM 9.2   Liver Function Tests: No results found for this basename: AST, ALT, ALKPHOS, BILITOT, PROT, ALBUMIN,  in the last 168 hours No results found for this basename: LIPASE, AMYLASE,  in the last 168 hours No results found for this basename:  AMMONIA,  in the last 168 hours CBC:  Recent Labs Lab 10/03/13 1018  WBC 7.4  HGB 17.2*  HCT 49.3  MCV 88.0  PLT 219   Cardiac Enzymes: No results found for this basename: CKTOTAL, CKMB, CKMBINDEX, TROPONINI,  in the last 168 hours  BNP (last 3 results) No results found for this basename: PROBNP,  in the last 8760 hours CBG: No results found for this basename: GLUCAP,  in the last 168 hours  Radiological Exams on Admission: Ct Head Wo Contrast  10/03/2013   CLINICAL DATA:  Dizziness  with weakness from waist down.  EXAM: CT HEAD WITHOUT CONTRAST  TECHNIQUE: Contiguous axial images were obtained from the base of the skull through the vertex without intravenous contrast.  COMPARISON:  None.  FINDINGS: Skull and Sinuses:No acute osseous findings. Clear mastoids. Symmetric IACs.  Orbits: No acute abnormality.  Brain: No evidence of acute abnormality, such as acute large vessel infarction, hemorrhage, hydrocephalus, or mass lesion/mass effect. There is cerebral volume loss which is age commensurate. Subtle linear low-density in the mid left cerebellum, conspicuity consistent with established small vessel infarct.  IMPRESSION: 1. No acute intracranial findings. 2. 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   Electronically Signed   By: Jorje Guild M.D.   On: 10/03/2013 22:00   Mr Thoracic Spine Wo Contrast  10/03/2013   CLINICAL DATA:  Low back pain radiating down both legs for several weeks. Bilateral lower extremity weakness. Falls.  EXAM: MRI THORACIC AND LUMBAR SPINE WITHOUT CONTRAST  TECHNIQUE: Multiplanar and multiecho pulse sequences of the thoracic and lumbar spine were obtained without intravenous contrast.  COMPARISON:  CT abdomen and pelvis 10/03/2013. Chest radiographs 08/19/2004. Lumbar spine MRI 05/23/2003  FINDINGS: MR THORACIC SPINE FINDINGS  Vertebral alignment is normal. Vertebral body heights are preserved without compression fracture. Bone marrow is diffusely  heterogeneous with areas of mildly diminished T1 signal. No vertebral marrow edema is seen. At T4-5, there is the suggestion of slight kinking and ventral displacement of the spinal cord (series 7, image 7 and series 8, image 12). Just below this, minimal T2 hyperintense signal is questioned in the spinal cord at the T5 level.  Prominent dorsal epidural fat is present throughout the mid thoracic spine. Multilevel right-sided anterior osteophytosis is present in the mid to lower thoracic spine. No posterior disc herniation or spinal canal or neural foraminal stenosis is identified. Paraspinal soft tissues are unremarkable.  MR LUMBAR SPINE FINDINGS  The same numbering employed on the prior lumbar spine MRI will be continued on this examination, with the last full intervertebral disc space being labeled L5-S1.  Vertebral body heights are preserved without compression fracture. As in the thoracic spine, vertebral bone marrow signal is mildly heterogeneous diffusely. No vertebral marrow edema is seen. Mild disc desiccation is present at L4-5 with at most minimal disc space narrowing. Other intervertebral disc space heights are preserved. Conus medullaris terminates at L1. Prominent dorsal epidural fat is present in the mid and upper lumbar spine. Visualized paraspinal soft tissues are unremarkable.  L1-2 and L2-3:  Negative.  L3-4: Mild disc bulge and facet and ligamentum flavum hypertrophy results in mild spinal canal narrowing, unchanged. No neural foraminal stenosis.  L4-5: Sequelae of interval laminectomy are identified. Central disc protrusion has increased from the prior study. Together with hypertrophy of the facets and residual posterior ligaments, this results in severe spinal stenosis. There is mild right neural foraminal stenosis.  L5-S1:  Negative.  IMPRESSION: 1. 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 2. No thoracic disc herniation or spinal stenosis.  3. Possible slight kinking and anterior displacement of the spinal cord at T4-5. While this is subtle and the cord is not clearly atrophic at this level, spinal cord herniation is not excluded. Consider further evaluation with CT myelography as clinically indicated. 4. Diffusely heterogeneous bone marrow signal. Although this can be caused by marrow infiltrative processes, the most common causes include anemia, smoking, obesity, or advancing age.   Electronically Signed   By:  Logan Bores   On: 10/03/2013 19:55   Mr Lumbar Spine Wo Contrast  10/03/2013   CLINICAL DATA:  Low back pain radiating down both legs for several weeks. Bilateral lower extremity weakness. Falls.  EXAM: MRI THORACIC AND LUMBAR SPINE WITHOUT CONTRAST  TECHNIQUE: Multiplanar and multiecho pulse sequences of the thoracic and lumbar spine were obtained without intravenous contrast.  COMPARISON:  CT abdomen and pelvis 10/03/2013. Chest radiographs 08/19/2004. Lumbar spine MRI 05/23/2003  FINDINGS: MR THORACIC SPINE FINDINGS  Vertebral alignment is normal. Vertebral body heights are preserved without compression fracture. Bone marrow is diffusely heterogeneous with areas of mildly diminished T1 signal. No vertebral marrow edema is seen. At T4-5, there is the suggestion of slight kinking and ventral displacement of the spinal cord (series 7, image 7 and series 8, image 12). Just below this, minimal T2 hyperintense signal is questioned in the spinal cord at the T5 level.  Prominent dorsal epidural fat is present throughout the mid thoracic spine. Multilevel right-sided anterior osteophytosis is present in the mid to lower thoracic spine. No posterior disc herniation or spinal canal or neural foraminal stenosis is identified. Paraspinal soft tissues are unremarkable.  MR LUMBAR SPINE FINDINGS  The same numbering employed on the prior lumbar spine MRI will be continued on this examination, with the last full intervertebral disc space being labeled  L5-S1.  Vertebral body heights are preserved without compression fracture. As in the thoracic spine, vertebral bone marrow signal is mildly heterogeneous diffusely. No vertebral marrow edema is seen. Mild disc desiccation is present at L4-5 with at most minimal disc space narrowing. Other intervertebral disc space heights are preserved. Conus medullaris terminates at L1. Prominent dorsal epidural fat is present in the mid and upper lumbar spine. Visualized paraspinal soft tissues are unremarkable.  L1-2 and L2-3:  Negative.  L3-4: Mild disc bulge and facet and ligamentum flavum hypertrophy results in mild spinal canal narrowing, unchanged. No neural foraminal stenosis.  L4-5: Sequelae of interval laminectomy are identified. Central disc protrusion has increased from the prior study. Together with hypertrophy of the facets and residual posterior ligaments, this results in severe spinal stenosis. There is mild right neural foraminal stenosis.  L5-S1:  Negative.  IMPRESSION: 1. 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 2. No thoracic disc herniation or spinal stenosis. 3. Possible slight kinking and anterior displacement of the spinal cord at T4-5. While this is subtle and the cord is not clearly atrophic at this level, spinal cord herniation is not excluded. Consider further evaluation with CT myelography as clinically indicated. 4. Diffusely heterogeneous bone marrow signal. Although this can be caused by marrow infiltrative processes, the most common causes include anemia, smoking, obesity, or advancing age.   Electronically Signed   By: Logan Bores   On: 10/03/2013 19:55   Ct Abdomen Pelvis W Contrast  10/03/2013   CLINICAL DATA:  Dizziness, weakness  EXAM: CT ABDOMEN AND PELVIS WITH CONTRAST  TECHNIQUE: Multidetector CT imaging of the abdomen and pelvis was performed using the standard protocol following bolus administration of intravenous contrast.   CONTRAST:  32mL OMNIPAQUE IOHEXOL 300 MG/ML  SOLN  COMPARISON:  None  FINDINGS: Lung bases are clear.  No pericardial fluid.  No focal hepatic lesion. Gallbladder, pancreas, spleen, adrenal glands, and kidneys are normal.  The stomach, small bowel, appendix, and cecum are normal. The colon demonstrates several scattered diverticula without acute inflammation. Rectum is normal.  Abdominal aorta is  normal caliber. No retroperitoneal or periportal lymphadenopathy.  There is mild haziness to the central mesentery with several small mesenteric lymph nodes (image 42, series 2 for example).  There is no ventral hernia. Bilateral fat filled inguinal hernias are noted. No free fluid the pelvis. The prostate gland and bladder normal. No pelvic lymphadenopathy. No aggressive osseous lesion.  : IMPRESSION:  1. No acute findings in the abdomen or pelvis. 2. Scattered colonic diverticula without evidence of diverticulosis. 3. 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   Electronically Signed   By: Suzy Bouchard M.D.   On: 10/03/2013 16:00     Assessment/Plan Principal Problem:   Lower extremity weakness Active Problems:   CORONARY ARTERY DISEASE   COPD   CORONARY ARTERY BYPASS GRAFT, TWO VESSEL, HX OF   OSA on CPAP   Abnormal MRI, spinal cord   1. Bilateral lower extremity weakness - at this time neurosurgeon Dr. Ellene Route feels that patient's lower extremity weakness are not related to patient's T4-5 kinking. Patient does have a dark urine with no RBCs in it. Concerned about rhabdomyolysis and other myopathies. I have ordered CK levels and for now we will hold statins and gently hydrate. If CK levels are elevated may need aggressive hydration and also may need further workup for autoimmune process. Check sedimentation rate. I have consulted neurologist Dr. Leonel Ramsay. 2. Abnormal CT abdomen finding - patient has no abdominal complaints. 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 We'll check lactic acid levels and LFTs. May discuss with GI in a.m. Patient is presently asymptomatic with regarding to this. 3. CAD status post CABG - denies any chest pain. 4. History of hyperlipidemia - hold statins until we get CK levels. 5. OSA on CPAP - CPAP at nighttime. 6. History of GI bleed.    Code Status: Full code.  Family Communication: Patient's family at the bedside.  Disposition Plan: Admit to inpatient.    [facility] Pager [phone].  If 7PM-7AM, please contact night-coverage [email or web address] Password [facility] 10/03/2013, 11:31 PM

## 2013-10-03 NOTE — ED Provider Notes (Signed)
CSN: [id number]     Arrival date & time 10/03/13  [id number] History   First MD Initiated Contact with Patient 10/03/13 1116     Chief Complaint  Patient presents with  . Dizziness  . Weakness     (Consider location/radiation/quality/duration/timing/severity/associated sxs/prior Treatment) Patient is a 73 y.o. male presenting with dizziness and weakness. The history is provided by the patient.  Dizziness Quality:  Imbalance Severity:  Moderate Onset quality:  Gradual Duration:  1 week Timing:  Constant Progression:  Worsening Chronicity:  New Context: physical activity   Relieved by:  Nothing Worsened by:  Nothing tried Ineffective treatments:  None tried Associated symptoms: weakness (bilateral lower extremity)   Associated symptoms: no diarrhea, no headaches, no shortness of breath, no syncope, no vision changes and no vomiting   Associated symptoms comment:  Hematuria Weakness Pertinent negatives include no headaches and no shortness of breath.    Past Medical History  Diagnosis Date  . Hypertension   . Hypercholesteremia   . Colon polyps ???    none on 2006 colonoscopy  . Hemorrhoid   . Diverticulosis of colon   . Gastric ulcer 2006    ? secondary to H Pylori, vs ASA.   Marland Kitchen H pylori ulcer 2006    treated with Prevpac  . Coronary artery disease   . Mental disorder   . COPD (chronic obstructive pulmonary disease)   . OSA on CPAP     diagnosed at [place] in [place]  . Cataract   . Depression     PHQ-9 positive at [facility] - score of 4-6  . GERD (gastroesophageal reflux disease)   . OA (osteoarthritis) of knee     left knee: tricompartmental deterioration. s/p steroid injections  . Benign hematuria June '08    CT abdomen negative; cystoscopy - negative (at [facility])   Past Surgical History  Procedure Laterality Date  . Knee surgery      left knee  . 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 Hemorrhoid surgery  1981  . Hemilaminectomy  June '12    L4-5 level due to  claudication  . Sternal wire removal  '09   Family History  Problem Relation Age of Onset  . Diabetes Brother   . Alcohol abuse Father   . Alcohol abuse Sister   . Cancer Neg Hx   . Heart disease Neg Hx   . Diabetes Brother   . Alcohol abuse Brother    History  Substance Use Topics  . Smoking status: Current Every Day Smoker -- 0.50 packs/day    Types: Cigarettes  . Smokeless tobacco: Never Used  . Alcohol Use: 3.0 oz/week    6 drink(s) per week     Comment: brandy     Review of Systems  Constitutional: Negative for fever and chills.  Respiratory: Negative for shortness of breath.   Cardiovascular: Negative for syncope.  Gastrointestinal: Negative for vomiting and diarrhea.  Neurological: Positive for dizziness and weakness. Negative for headaches.  All other systems reviewed and are negative.     Allergies  Sulfonamide derivatives  Home Medications   Prior to Admission medications   Medication Sig Start Date End Date Taking? Authorizing Provider  aspirin EC 81 MG tablet Take 81 mg by mouth daily.   Yes Historical Provider, MD  Aspirin-Caffeine (BAYER BACK & BODY PAIN EX ST PO) Take 2 tablets by mouth daily as needed (pain).   Yes Historical Provider, MD  citalopram (CELEXA)  10 MG tablet Take 5 mg by mouth daily. Take one-half tablet by mouth once every day.   Yes Historical Provider, MD  ibuprofen (ADVIL,MOTRIN) 200 MG tablet Take 200 mg by mouth every 6 (six) hours as needed. For pain.   Yes Historical Provider, MD  lisinopril (PRINIVIL,ZESTRIL) 20 MG tablet Take 10 mg by mouth daily. Take one-half tab by mouth once every day for blood pressure.   Yes Historical Provider, MD  metoprolol succinate (TOPROL-XL) 25 MG 24 hr tablet Take 25 mg by mouth 2 (two) times daily.   Yes Historical Provider, MD  ranitidine (ZANTAC) 150 MG tablet Take 1 tablet (150 mg total) by mouth 2 (two) times daily. 06/29/12  Yes Neena Rhymes, MD  simvastatin (ZOCOR) 80 MG tablet Take 1  tablet (80 mg total) by mouth at bedtime. 07/04/12  Yes Neena Rhymes, MD  vitamin B-12 (CYANOCOBALAMIN) 1000 MCG tablet Take 1,000 mcg by mouth daily.   Yes Historical Provider, MD   BP 147/69  Pulse 82  Temp(Src) 97.8 F (36.6 C) (Oral)  Resp 20  SpO2 97% Physical Exam  Nursing note and vitals reviewed. Constitutional: He is oriented to person, place, and time. He appears well-developed and well-nourished. No distress.  HENT:  Head: Normocephalic and atraumatic.  Mouth/Throat: No oropharyngeal exudate.  Eyes: EOM are normal. Pupils are equal, round, and reactive to light.  Neck: Normal range of motion. Neck supple.  Cardiovascular: Normal rate and regular rhythm.  Exam reveals no friction rub.   No murmur heard. Pulmonary/Chest: Effort normal and breath sounds normal. No respiratory distress. He has no wheezes. He has no rales.  Abdominal: Soft. He exhibits no distension. There is no tenderness. There is no rebound.  Musculoskeletal: Normal range of motion. He exhibits no edema.  Neurological: He is alert and oriented to person, place, and time. No cranial nerve deficit or sensory deficit. He exhibits abnormal muscle tone (bilateral lower extremities). GCS eye subscore is 4. GCS verbal subscore is 5. GCS motor subscore is 6.  Reflex Scores:      Patellar reflexes are 2+ on the right side and 2+ on the left side. Skin: No rash noted. He is not diaphoretic.    ED Course  Procedures (including critical care time) Labs Review Labs Reviewed  CBC - Abnormal; Notable for the following:    Hemoglobin 17.2 (*)    All other components within normal limits  BASIC METABOLIC PANEL - Abnormal; Notable for the following:    Glucose, Bld 140 (*)    GFR calc non Af Amer 90 (*)    All other components within normal limits    Imaging Review Mr Thoracic Spine Wo Contrast  10/03/2013   CLINICAL DATA:  Low back pain radiating down both legs for several weeks. Bilateral lower extremity  weakness. Falls.  EXAM: MRI THORACIC AND LUMBAR SPINE WITHOUT CONTRAST  TECHNIQUE: Multiplanar and multiecho pulse sequences of the thoracic and lumbar spine were obtained without intravenous contrast.  COMPARISON:  CT abdomen and pelvis 10/03/2013. Chest radiographs 08/19/2004. Lumbar spine MRI 05/23/2003  FINDINGS: MR THORACIC SPINE FINDINGS  Vertebral alignment is normal. Vertebral body heights are preserved without compression fracture. Bone marrow is diffusely heterogeneous with areas of mildly diminished T1 signal. No vertebral marrow edema is seen. At T4-5, there is the suggestion of slight kinking and ventral displacement of the spinal cord (series 7, image 7 and series 8, image 12). Just below this, minimal T2 hyperintense signal is questioned in  the spinal cord at the T5 level.  Prominent dorsal epidural fat is present throughout the mid thoracic spine. Multilevel right-sided anterior osteophytosis is present in the mid to lower thoracic spine. No posterior disc herniation or spinal canal or neural foraminal stenosis is identified. Paraspinal soft tissues are unremarkable.  MR LUMBAR SPINE FINDINGS  The same numbering employed on the prior lumbar spine MRI will be continued on this examination, with the last full intervertebral disc space being labeled L5-S1.  Vertebral body heights are preserved without compression fracture. As in the thoracic spine, vertebral bone marrow signal is mildly heterogeneous diffusely. No vertebral marrow edema is seen. Mild disc desiccation is present at L4-5 with at most minimal disc space narrowing. Other intervertebral disc space heights are preserved. Conus medullaris terminates at L1. Prominent dorsal epidural fat is present in the mid and upper lumbar spine. Visualized paraspinal soft tissues are unremarkable.  L1-2 and L2-3:  Negative.  L3-4: Mild disc bulge and facet and ligamentum flavum hypertrophy results in mild spinal canal narrowing, unchanged. No neural  foraminal stenosis.  L4-5: Sequelae of interval laminectomy are identified. Central disc protrusion has increased from the prior study. Together with hypertrophy of the facets and residual posterior ligaments, this results in severe spinal stenosis. There is mild right neural foraminal stenosis.  L5-S1:  Negative.  IMPRESSION: 1. 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 2. No thoracic disc herniation or spinal stenosis. 3. Possible slight kinking and anterior displacement of the spinal cord at T4-5. While this is subtle and the cord is not clearly atrophic at this level, spinal cord herniation is not excluded. Consider further evaluation with CT myelography as clinically indicated. 4. Diffusely heterogeneous bone marrow signal. Although this can be caused by marrow infiltrative processes, the most common causes include anemia, smoking, obesity, or advancing age.   Electronically Signed   By: Logan Bores   On: 10/03/2013 19:55   Mr Lumbar Spine Wo Contrast  10/03/2013   CLINICAL DATA:  Low back pain radiating down both legs for several weeks. Bilateral lower extremity weakness. Falls.  EXAM: MRI THORACIC AND LUMBAR SPINE WITHOUT CONTRAST  TECHNIQUE: Multiplanar and multiecho pulse sequences of the thoracic and lumbar spine were obtained without intravenous contrast.  COMPARISON:  CT abdomen and pelvis 10/03/2013. Chest radiographs 08/19/2004. Lumbar spine MRI 05/23/2003  FINDINGS: MR THORACIC SPINE FINDINGS  Vertebral alignment is normal. Vertebral body heights are preserved without compression fracture. Bone marrow is diffusely heterogeneous with areas of mildly diminished T1 signal. No vertebral marrow edema is seen. At T4-5, there is the suggestion of slight kinking and ventral displacement of the spinal cord (series 7, image 7 and series 8, image 12). Just below this, minimal T2 hyperintense signal is questioned in the spinal cord at the T5 level.   Prominent dorsal epidural fat is present throughout the mid thoracic spine. Multilevel right-sided anterior osteophytosis is present in the mid to lower thoracic spine. No posterior disc herniation or spinal canal or neural foraminal stenosis is identified. Paraspinal soft tissues are unremarkable.  MR LUMBAR SPINE FINDINGS  The same numbering employed on the prior lumbar spine MRI will be continued on this examination, with the last full intervertebral disc space being labeled L5-S1.  Vertebral body heights are preserved without compression fracture. As in the thoracic spine, vertebral bone marrow signal is mildly heterogeneous diffusely. No vertebral marrow edema is seen. Mild disc desiccation is present at L4-5 with  at most minimal disc space narrowing. Other intervertebral disc space heights are preserved. Conus medullaris terminates at L1. Prominent dorsal epidural fat is present in the mid and upper lumbar spine. Visualized paraspinal soft tissues are unremarkable.  L1-2 and L2-3:  Negative.  L3-4: Mild disc bulge and facet and ligamentum flavum hypertrophy results in mild spinal canal narrowing, unchanged. No neural foraminal stenosis.  L4-5: Sequelae of interval laminectomy are identified. Central disc protrusion has increased from the prior study. Together with hypertrophy of the facets and residual posterior ligaments, this results in severe spinal stenosis. There is mild right neural foraminal stenosis.  L5-S1:  Negative.  IMPRESSION: 1. 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 2. No thoracic disc herniation or spinal stenosis. 3. Possible slight kinking and anterior displacement of the spinal cord at T4-5. While this is subtle and the cord is not clearly atrophic at this level, spinal cord herniation is not excluded. Consider further evaluation with CT myelography as clinically indicated. 4. Diffusely heterogeneous bone marrow signal. Although this  can be caused by marrow infiltrative processes, the most common causes include anemia, smoking, obesity, or advancing age.   Electronically Signed   By: Logan Bores   On: 10/03/2013 19:55   Ct Abdomen Pelvis W Contrast  10/03/2013   CLINICAL DATA:  Dizziness, weakness  EXAM: CT ABDOMEN AND PELVIS WITH CONTRAST  TECHNIQUE: Multidetector CT imaging of the abdomen and pelvis was performed using the standard protocol following bolus administration of intravenous contrast.  CONTRAST:  101mL OMNIPAQUE IOHEXOL 300 MG/ML  SOLN  COMPARISON:  None  FINDINGS: Lung bases are clear.  No pericardial fluid.  No focal hepatic lesion. Gallbladder, pancreas, spleen, adrenal glands, and kidneys are normal.  The stomach, small bowel, appendix, and cecum are normal. The colon demonstrates several scattered diverticula without acute inflammation. Rectum is normal.  Abdominal aorta is normal caliber. No retroperitoneal or periportal lymphadenopathy.  There is mild haziness to the central mesentery with several small mesenteric lymph nodes (image 42, series 2 for example).  There is no ventral hernia. Bilateral fat filled inguinal hernias are noted. No free fluid the pelvis. The prostate gland and bladder normal. No pelvic lymphadenopathy. No aggressive osseous lesion.  : IMPRESSION:  1. No acute findings in the abdomen or pelvis. 2. Scattered colonic diverticula without evidence of diverticulosis. 3. 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   Electronically Signed   By: Suzy Bouchard M.D.   On: 10/03/2013 16:00     EKG Interpretation   Date/Time:  Tuesday Oct 03 2013 10:01:58 EDT Ventricular Rate:  87 PR Interval:  136 QRS Duration: 84 QT Interval:  390 QTC Calculation: 469 R Axis:   -4 Text Interpretation:  Normal sinus rhythm Left ventricular hypertrophy  with repolarization abnormality Abnormal ECG Nonspecific changes laterally  Confirmed by Mingo Amber  MD,  Norleen Xie ([id number]) on 10/03/2013 11:18:39 AM      MDM   Final diagnoses:  Leg weakness, bilateral  Hematuria  Abnormal MRI, spinal cord    Male presents with progressively worsening bilateral lower chimney weakness for the past week. He also states imbalance when walking. Denies any sensory changes, fever, urinary or stool incontinence, retention, perianal numbness or tingling, lower extremity sensory changes. On exam vitals stable. No abdominal pain. Normal lower chimney reflexes, but decreased strength in bilateral lower extremities. We'll start labs and CT scan of the abdomen.  CT of Abd/pelvis without acute abnormality. UA with possible UTI - given rocephin. With decreased leg strength, will obtain MRIs. MRI shows kinking of spinal cord. Dr. Ellene Route and I evaluated MRI together - he is underwhelmed with films, will see patient, no need for acute neurosurgical intervention. Dr. Hal Hope admitting to stepdown.     Osvaldo Shipper, MD 10/03/13 2102

## 2013-10-04 DIAGNOSIS — M6282 Rhabdomyolysis: Principal | ICD-10-CM

## 2013-10-04 DIAGNOSIS — I10 Essential (primary) hypertension: Secondary | ICD-10-CM

## 2013-10-04 LAB — C-REACTIVE PROTEIN: CRP: 7.4 mg/dL — ABNORMAL HIGH (ref <0.60)

## 2013-10-04 LAB — HEPATIC FUNCTION PANEL
ALT: 164 U/L — ABNORMAL HIGH (ref 0–53)
AST: 391 U/L — ABNORMAL HIGH (ref 0–37)
Albumin: 2.7 g/dL — ABNORMAL LOW (ref 3.5–5.2)
Alkaline Phosphatase: 48 U/L (ref 39–117)
BILIRUBIN INDIRECT: 0.6 mg/dL (ref 0.3–0.9)
Bilirubin, Direct: 0.3 mg/dL (ref 0.0–0.3)
Total Bilirubin: 0.9 mg/dL (ref 0.3–1.2)
Total Protein: 5.6 g/dL — ABNORMAL LOW (ref 6.0–8.3)

## 2013-10-04 LAB — LIPASE, BLOOD: LIPASE: 22 U/L (ref 11–59)

## 2013-10-04 LAB — URINALYSIS, ROUTINE W REFLEX MICROSCOPIC
Glucose, UA: NEGATIVE mg/dL
KETONES UR: 40 mg/dL — AB
Nitrite: NEGATIVE
PROTEIN: 100 mg/dL — AB
Specific Gravity, Urine: 1.02 (ref 1.005–1.030)
Urobilinogen, UA: 0.2 mg/dL (ref 0.0–1.0)
pH: 5 (ref 5.0–8.0)

## 2013-10-04 LAB — SEDIMENTATION RATE: SED RATE: 30 mm/h — AB (ref 0–16)

## 2013-10-04 LAB — URINE CULTURE
CULTURE: NO GROWTH
Colony Count: NO GROWTH

## 2013-10-04 LAB — URINE MICROSCOPIC-ADD ON

## 2013-10-04 LAB — LIPID PANEL
Cholesterol: 132 mg/dL (ref 0–200)
HDL: 46 mg/dL (ref >39)
LDL Cholesterol: 52 mg/dL (ref 0–99)
Total CHOL/HDL Ratio: 2.9 RATIO
Triglycerides: 170 mg/dL — ABNORMAL HIGH (ref <150)
VLDL: 34 mg/dL (ref 0–40)

## 2013-10-04 LAB — CK
Total CK: 19588 U/L — ABNORMAL HIGH (ref 7–232)
Total CK: 17021 U/L — ABNORMAL HIGH (ref 7–232)
Total CK: 18235 U/L — ABNORMAL HIGH (ref 7–232)

## 2013-10-04 LAB — MRSA PCR SCREENING: MRSA by PCR: NEGATIVE

## 2013-10-04 LAB — TROPONIN I

## 2013-10-04 LAB — HEMOGLOBIN A1C
HEMOGLOBIN A1C: 6.4 % — AB (ref <5.7)
MEAN PLASMA GLUCOSE: 137 mg/dL — AB (ref <117)

## 2013-10-04 LAB — LACTIC ACID, PLASMA: LACTIC ACID, VENOUS: 1.2 mmol/L (ref 0.5–2.2)

## 2013-10-04 MED ORDER — PANTOPRAZOLE SODIUM 40 MG PO TBEC
40.0000 mg | DELAYED_RELEASE_TABLET | Freq: Every day | ORAL | Status: DC
  Administered 2013-10-04 – 2013-10-06 (×3): 40 mg via ORAL
  Filled 2013-10-04 (×3): qty 1

## 2013-10-04 MED ORDER — SODIUM CHLORIDE 0.9 % IV SOLN
INTRAVENOUS | Status: DC
  Administered 2013-10-04 – 2013-10-05 (×3): via INTRAVENOUS

## 2013-10-04 MED ORDER — ENOXAPARIN SODIUM 40 MG/0.4ML ~~LOC~~ SOLN
40.0000 mg | SUBCUTANEOUS | Status: DC
  Administered 2013-10-04 – 2013-10-06 (×3): 40 mg via SUBCUTANEOUS
  Filled 2013-10-04 (×4): qty 0.4

## 2013-10-04 MED ORDER — SODIUM CHLORIDE 0.9 % IV BOLUS (SEPSIS): 500.0000 mL | Freq: Once | INTRAVENOUS | Status: DC

## 2013-10-04 NOTE — Progress Notes (Signed)
SLP Cancellation Note  Patient Details Name: Bradley French MRN: [id number] DOB: [birth year]   Cancelled treatment:       Reason Eval/Treat Not Completed: SLP screened, no needs identified, will sign off   Assunta Curtis 10/04/2013, 12:56 PM

## 2013-10-04 NOTE — Progress Notes (Signed)
Nutrition Brief Note  Patient identified on the Malnutrition Screening Tool (MST) Report  Wt Readings from Last 15 Encounters:  10/03/13 232 lb 12.9 oz (105.6 kg)  06/29/12 239 lb (108.41 kg)  06/20/12 228 lb 14.4 oz (103.828 kg)  12/26/04 238 lb (107.956 kg)    Body mass index is 28.35 kg/(m^2). Patient meets criteria for Overweight based on current BMI. Pt states that he used to weigh 242 lbs (Christmas 2014) but his doctor suggested he lose some weight. Pt reports 10 lb wt loss- 4% wt loss in 4 months not concerning. Pt appears well nourished. He reports having a good appetite and eating well PTA. Denies any nutrition-related questions.   Current diet order is Heart Healthy, patient is consuming approximately 100% of meals at this time. Pt states he was still hungry after meal. RD to order additional snacks. Pt also requesting lactose-free milk only. Labs and medications reviewed.   No nutrition interventions warranted at this time. If nutrition issues arise, please consult RD.   Pryor Ochoa RD, LDN Inpatient Clinical Dietitian Pager: [phone] After Hours Pager: [phone]

## 2013-10-04 NOTE — Consult Note (Addendum)
Neurology Consultation Reason for Consult: Lower extremity weakness Referring Physician: Hal Hope, A   CC: Lower extremity weakness  History is obtained from: Patient  HPI: Bradley French is a 73 y.o. male with a history of progressive lower shrug weakness of the past week. He states that he had a viral illness couple of weeks ago, and since then has been having progressive weakness of his lower extremities. He states that prior to this, he would occasionally have pain in his left leg and weakness that he stood on his left leg too long, but nothing in his right leg. Over the past week he has had a progressive bilateral weakness.  Also of note, he has noticed over the past day that he has dark urine. It is positive for hemoglobin, but no red cells suggestive of myoglobinuria.  He denies double vision, paresthesias, changes in bowel or bladder, other symptoms   ROS: A 14 point ROS was performed and is negative except as noted in the HPI.  Past Medical History  Diagnosis Date  . Hypertension   . Hypercholesteremia   . Colon polyps ???    none on 2006 colonoscopy  . Hemorrhoid   . Diverticulosis of colon   . Gastric ulcer 2006    ? secondary to H Pylori, vs ASA.   Marland Kitchen H pylori ulcer 2006    treated with Prevpac  . Coronary artery disease   . Mental disorder   . COPD (chronic obstructive pulmonary disease)   . OSA on CPAP     diagnosed at [place] in [place]  . Cataract   . Depression     PHQ-9 positive at [facility] - score of 4-6  . GERD (gastroesophageal reflux disease)   . OA (osteoarthritis) of knee     left knee: tricompartmental deterioration. s/p steroid injections  . Benign hematuria June '08    CT abdomen negative; cystoscopy - negative (at [place])    Family History: No history of neuropathies  Social History: Tob: Current smoker  Exam: Current vital signs: BP 117/43  Pulse 91  Temp(Src) 98.3 F (36.8 C) (Oral)  Resp 22  Ht 6' 4"  (1.93 m)  Wt 105.6 kg (232 lb 12.9 oz)   BMI 28.35 kg/m2  SpO2 98% Vital signs in last 24 hours: Temp:  [97.7 F (36.5 C)-98.3 F (36.8 C)] 98.3 F (36.8 C) (05/06 0000) Pulse Rate:  [80-94] 91 (05/06 0045) Resp:  [15-25] 22 (05/05 2250) BP: (117-163)/(43-84) 117/43 mmHg (05/06 0045) SpO2:  [94 %-98 %] 98 % (05/05 2250) Weight:  [105.6 kg (232 lb 12.9 oz)] 105.6 kg (232 lb 12.9 oz) (05/05 2250)  General: In bed, NAD CV: Regular rate and rhythm Mental Status: Patient is awake, alert, oriented to person, place, month, year, and situation. Immediate and remote memory are intact. Patient is able to give a clear and coherent history. No signs of aphasia or neglect Cranial Nerves: II: Visual Fields are full. Pupils are equal, round, and reactive to light.  Discs are difficult to visualize. III,IV, VI: EOMI without ptosis or diploplia.  V: Facial sensation is symmetric to temperature VII: Facial movement is symmetric.  VIII: hearing is intact to voice X: Uvula elevates symmetrically XI: Shoulder shrug is symmetric. XII: tongue is midline without atrophy or fasciculations.  Motor: Tone is normal. Bulk is normal. 5/5 strength was present in his upper extremities, he has 4/5 weakness of bilateral lower extremities in all muscle groups tested. Sensory: Sensation is decreased at the toes to  vibration, mildly decreased at the ankles, intact at the knees. He has a decreased to temperature and pin up to the thighs bilaterally. Deep Tendon Reflexes: 2+ and symmetric in the biceps and ankles. He has 2+ reflex in the right patella, trace in the left patella Cerebellar: FNF and HKS are intact bilaterally Gait: Not tested 2/2 weakness.      I have reviewed labs in epic and the results pertinent to this consultation are: UA - mild leukocytosis  I have reviewed the images obtained: MRI L-spine - stenosis at L4-5  Impression: 73 year old male with progressive lower extremity weakness. He does not have a lot of sensory complaints,  but does seem to have some loss of pin and temperature to the thighs. I'm not sure if this is new or old. Possibilities include that the lumbar stenosis is responsible for his weakness, asthenia secondary to illness and urinary tract infection. Rhabdomyolysis selectively affecting the lower extremities is unusual, but given the findings on UA, I agree with checking a CK.   Guillian-Barre syndrome, either AIDP, AMAN, or AMSAN, could be a consideration, but his preserved reflexes would argue against this.  Recommendations: 1) CK, ESR, CRP 2) if patient continues to worsen, it may be reasonable to check CSF protein the lumbar puncture.  3) PT 4) will continue to follow 5) EMG/NCS would be useful, but not certain this can be done as an inpatient. ? Whether lumbar stenosis could be contributing to his current presentation   Roland Rack, MD [facility] [phone]  If 7pm- 7am, please page neurology on call as listed in [place].

## 2013-10-04 NOTE — Progress Notes (Signed)
Occupational Therapy Evaluation Patient Details Name: Bradley French MRN: [id number] DOB: 07/09/1940 Today's Date: 10/04/2013    History of Present Illness 73 year old with weakness in bilateral lower extremities which per MD note developed a little over week ago. Per MD, about 3 weeks ago he had a flulike syndrome involving a GI bug. Pt has had 2 falls since onset of weakness. 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    Clinical Impression   PTA, pt was independent with ADL and mobility. During eval, pt with apparent  BLE weakness in the T12-L3 distribution. Pt does not complain of any BLE numbness or pain or sensory changes. Pt unable to lift B feet off from due to weak hip flexors. Pt states he has had 2 falls within the past week. States that his legs just "buckled underneath him". Pt relies heavily on BUE to power up to stand. At this time, do not feel pt is safe to D/C home @ RW level due to BLE weakness and fall risk. Will further assess D/C plan in am when +2 help available for pt safety. If BLE weakness persists, pt may benefit from rehab prior to D/C home. Will follow to facilitate safe D/C plan.    Follow Up Recommendations  Supervision/Assistance - 24 hour    Equipment Recommendations  Tub/shower bench;3 in 1 bedside comode    Recommendations for Other Services Other (comment) (Will further assess in am)     Precautions / Restrictions Precautions Precautions: Fall Restrictions Weight Bearing Restrictions: No      Mobility Bed Mobility                  Transfers Overall transfer level: Needs assistance   Transfers: Sit to/from Stand Sit to Stand: Min assist;+2 physical assistance (will need +2 for safety)         General transfer comment: Did not ambulate due ot BLE weakness. will need +2 for safety due to high fall risk    Balance                                            ADL Overall ADL's : Needs assistance/impaired          Upper Body Bathing: Set up;Sitting   Lower Body Bathing: Minimal assistance;Sit to/from stand   Upper Body Dressing : Set up;Sitting   Lower Body Dressing: Minimal assistance;Sit to/from stand   Toilet Transfer: Minimal assistance (only completed sit - stand)           Functional mobility during ADLs: +2 for safety/equipment;Minimal assistance General ADL Comments: Pt using momentum and heavy use of BLE to complete sit - stand.      Vision                     Perception     Praxis      Pertinent Vitals/Pain No c/o pain VSS     Hand Dominance Right   Extremity/Trunk Assessment Upper Extremity Assessment Upper Extremity Assessment: Overall WFL for tasks assessed   Lower Extremity Assessment Lower Extremity Assessment: RLE deficits/detail;LLE deficits/detail RLE Deficits / Details: BLE weakness. LLE appears slihtly weaker than L. WEakness appears greatest in T12-L3 distribution (no c/o LE pain  or numbness)   Cervical / Trunk Assessment Cervical / Trunk Assessment: Normal   Communication Communication Communication: No difficulties   Cognition Arousal/Alertness: Awake/alert  Behavior During Therapy: WFL for tasks assessed/performed Overall Cognitive Status: Within Functional Limits for tasks assessed                     General Comments       Exercises Exercises: Other exercises Other Exercises Other Exercises: educated pt on completing BLE AROM while sitting in chair Other Exercises: bridging in bed.  Other Exercises: G eliminated hip flexion in sidelying   Shoulder Instructions      Home Living Family/patient expects to be discharged to:: Private residence Living Arrangements: Spouse/significant other Available Help at Discharge: Family;Available 24 hours/day Type of Home: House Home Access: Stairs to enter [employer] of Steps: 4-6 Entrance Stairs-Rails: Right Home Layout: Two level;Able to live on main level with  bedroom/bathroom     Bathroom Shower/Tub: Teacher, early years/pre: Handicapped height Bathroom Accessibility: Yes How Accessible: Accessible via walker Home Equipment: [place] - single point          Prior Functioning/Environment Level of Independence: Independent             OT Diagnosis: Generalized weakness   OT Problem List: Decreased strength;Decreased range of motion;Decreased knowledge of use of DME or AE;Impaired balance (sitting and/or standing)   OT Treatment/Interventions: Self-care/ADL training;Therapeutic exercise;DME and/or AE instruction;Therapeutic activities;Patient/family education;Balance training    OT Goals(Current goals can be found in the care plan section) Acute Rehab OT Goals Patient Stated Goal: to go home OT Goal Formulation: With patient Time For Goal Achievement: 10/18/13 Potential to Achieve Goals: Good  OT Frequency: Min 2X/week   Barriers to D/C:  4-6 STE          Co-evaluation              End of Session Equipment Utilized During Treatment: Gait belt Nurse Communication: Mobility status  Activity Tolerance: Patient tolerated treatment well Patient left: in chair;with call bell/phone within reach;with family/visitor present   Time: 8016-5537 OT Time Calculation (min): 22 min Charges:  OT General Charges $OT Visit: 1 Procedure OT Evaluation $Initial OT Evaluation Tier I: 1 Procedure OT Treatments $Self Care/Home Management : 8-22 mins G-Codes:    Roney Jaffe Ardie Dragoo 11-Oct-2013, 4:40 PM   [facility], OTR/L  [phone] 10-11-2013

## 2013-10-04 NOTE — Progress Notes (Signed)
[name]  Bradley French QIW:[id number] DOB: 10-28-1940 DOA: 10/03/2013 PCP: Pcp Not In System  Admit HPI / Brief Narrative: 73 y.o. M w/ history of CAD status post CABG, OSA, hypertension, hyperlipidemia, and GI bleed who was brought to the ER with increasing weakness of the lower extremities. Patient had been experiencing weakness over the preceding few weeks which became more severe the week prior to his presentation leading to at least 2 falls. Denied having hit his head or loss consciousness. Denied having any incontinence of urine or bowels.   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 MRIs of the T-spine and L-spine were done which showed kinking at T4-5 level and also spinal stenosis at L4-5 level. Neurosurgeon on call was consulted and felt patient's weakness could not be from the kinking at T4-5. CT head was negative for acute findings. Patient was able to move his upper extremities without any difficulty and had no difficulty speaking swallowing or with any visual problems.   HPI/Subjective: The patient reports no new complaints today.  He says his legs still feel very weak, but he was able to walk in the hallway with physical therapy today.  He denies bowel or bladder incontinence.  He denies chest pain shortness of breath abdominal pain or headache.  Assessment/Plan:  Rhabdomyolysis  CK markedly elevated at peak of ~19K - trending downward - keep hydrated - most likely culprit appears to be his Zocor - I have counseled him that he should not take this medication unless cleared to do so by a physician - continue to hydrate - watch renal function - trend CK   Idiopathic Progressive B LE weakness Appears to be due to rhabdomyolysis - continues to have preserved reflexes per Neuro  Transaminitis Etiology unclear - perhaps Zocor is to blame for this as well - follow with  hydration - consider further evaluation if rapid correction not noted  Polycythemia Suspect the elevated hemoglobin is simply due to dehydration - recheck with hydration  Hx of CAD Asymptomatic at present  COPD Well compensated  OSA on CPAP Continue home CPAP regimen every night  Equivocal UA UA not normal but also not strongly convincing of UTI - continue empiric antibiotic until culture available  HTN Control not ideal - likely exacerbated by pain - adjust pain regimen first and follow trend  HLD Diet management only for now as discussed above  Code Status: FULL Family Communication: Spoke with patient and wife at bedside Disposition Plan: Stable for transfer to a medical bed   Consultants: Neurology Neurosurgery  Procedures: None  Antibiotics: Ceftriaxone 5/5>>  DVT prophylaxis: Lovenox  Objective: Blood pressure 149/67, pulse 72, temperature 98.2 F (36.8 C), temperature source Oral, resp. rate 15, height 6\' 4"  (1.93 m), weight 105.6 kg (232 lb 12.9 oz), SpO2 96.00%.  Intake/Output Summary (Last 24 hours) at 10/04/13 1547 Last data filed at 10/04/13 1401  Gross per 24 hour  Intake   2700 ml  Output   1000 ml  Net   1700 ml   Exam: General: No acute respiratory distress Lungs: Clear to auscultation bilaterally without wheezes or crackles Cardiovascular: Regular rate and rhythm without murmur gallop or rub normal S1 and S2 Abdomen: Nontender, nondistended, soft, bowel sounds positive, no rebound, no ascites, no appreciable mass Extremities: No significant cyanosis, clubbing, or edema bilateral lower extremities Neurologic:  See neurology exam  Data Reviewed: Basic  Metabolic Panel:  Recent Labs Lab 10/03/13 1018  NA 146  K 5.1  CL 105  CO2 25  GLUCOSE 140*  BUN 12  CREATININE 0.76  CALCIUM 9.2   Liver Function Tests:  Recent Labs Lab 10/04/13 0135  AST 391*  ALT 164*  ALKPHOS 48  BILITOT 0.9  PROT 5.6*  ALBUMIN 2.7*    Recent  Labs Lab 10/04/13 0135  LIPASE 22   CBC:  Recent Labs Lab 10/03/13 1018  WBC 7.4  HGB 17.2*  HCT 49.3  MCV 88.0  PLT 219   Cardiac Enzymes:  Recent Labs Lab 10/04/13 0135 10/04/13 0545  CKTOTAL 87564* 17021*  TROPONINI <0.30  --     Recent Results (from the past 240 hour(s))  MRSA PCR SCREENING     Status: None   Collection Time    10/03/13 10:52 PM      Result Value Ref Range Status   MRSA by PCR NEGATIVE  NEGATIVE Final   Comment:            The GeneXpert MRSA Assay (FDA     approved for NASAL specimens     only), is one component of a     comprehensive MRSA colonization     surveillance program. It is not     intended to diagnose MRSA     infection nor to guide or     monitor treatment for     MRSA infections.     Studies:  Recent x-ray studies have been reviewed in detail by the Attending Physician  Scheduled Meds:  Scheduled Meds: . aspirin EC  81 mg Oral Daily  . cefTRIAXone (ROCEPHIN)  IV  1 g Intravenous Q24H  . citalopram  5 mg Oral Daily  . famotidine  20 mg Oral BID  . lisinopril  10 mg Oral Daily  . metoprolol succinate  25 mg Oral BID  . pantoprazole  40 mg Oral QHS  . sodium chloride  500 mL Intravenous Once  . vitamin B-12  1,000 mcg Oral Daily    Time spent on care of this patient: 35 mins   Cherene Altes , MD   [facility] Office  [phone] Pager - Text Page per Amion as per below:  On-Call/Text Page:      [email or web address]      password [email or web address]  If 7PM-7AM, please contact night-coverage [email or web address] Password [email or web address] 10/04/2013, 3:47 PM   LOS: 1 day

## 2013-10-04 NOTE — Progress Notes (Signed)
Utilization review completed.  

## 2013-10-04 NOTE — Progress Notes (Signed)
Subjective: States he has no change since yesterday. He states he walked the hallway today with therapy.   Objective: Current vital signs: BP 149/67  Pulse 72  Temp(Src) 98.2 F (36.8 C) (Oral)  Resp 15  Ht 6' 4"  (1.93 m)  Wt 105.6 kg (232 lb 12.9 oz)  BMI 28.35 kg/m2  SpO2 96% Vital signs in last 24 hours: Temp:  [97.7 F (36.5 C)-99 F (37.2 C)] 98.2 F (36.8 C) (05/06 1221) Pulse Rate:  [72-101] 72 (05/06 1221) Resp:  [15-25] 15 (05/06 1221) BP: (117-162)/(43-84) 149/67 mmHg (05/06 1221) SpO2:  [94 %-98 %] 96 % (05/06 1221) Weight:  [105.6 kg (232 lb 12.9 oz)] 105.6 kg (232 lb 12.9 oz) (05/05 2250)  Intake/Output from previous day: 05/05 0701 - 05/06 0700 In: 990 [P.O.:240; I.V.:750] Out: 825 [Urine:825] Intake/Output this shift: Total I/O In: 1470 [P.O.:720; I.V.:750] Out: 175 [Urine:175] Nutritional status: Cardiac  Neurologic Exam: Mental Status: Alert, oriented, thought content appropriate.  Speech fluent without evidence of aphasia.  Able to follow 3 step commands without difficulty. Cranial Nerves: II: visual fields grossly normal, pupils equal, round, reactive to light and accommodation III,IV, VI: ptosis not present, extraocular muscles extra-ocular motions intact bilaterally V,VII: smile symmetric, facial light touch sensation normal bilaterally VIII: hearing normal bilaterally IX,X: gag reflex present XI: trapezius strength/neck flexion strength normal bilaterally XII: tongue strength normal  Motor: Right : Upper extremity    Left:     Upper extremity 5/5 deltoid       5/5 deltoid 5/5 tricep      5/5 tricep 5/5 biceps      5/5 biceps  5/5wrist flexion     5/5 wrist flexion 5/5 wrist extension     5/5 wrist extension 5/5 hand grip      5/5 hand grip  Lower extremity     Lower extremity 5/5 hip flexor      5/5 hip flexor 4/5 hip adductors     4/5 hip adductors 5/5 hip abductors     5/5 hip abductors 5/5 quadricep      5/5 quadriceps  5/5  hamstrings     5/5 hamstrings 5/5 plantar flexion       5/5 plantar flexion 5/5 plantar extension     5/5 plantar extension Tone and bulk:normal tone throughout; no atrophy noted --when asked to lift leg at hip with knee locked he attempts but cannot lift antigravity--but if leg aided to 45 degrees he can breifly hold antigravity  Sensory: decreased vibratory sensation in bilateral feet and ankles, stocking distribution decreased sensation to cold temperature.  States his right inner thigh has decreased temperature sensation but left is normal Deep Tendon Reflexes:  Right: Upper Extremity   Left: Upper extremity   biceps (C-5 to C-6) 2/4   biceps (C-5 to C-6) 2/4 tricep (C7) 2/4    triceps (C7) 2/4 Brachioradialis (C6) 2/4  Brachioradialis (C6) 2/4  Lower Extremity Lower Extremity  quadriceps (L-2 to L-4) 2/4   quadriceps (L-2 to L-4) 2/4 Achilles (S1) 2/4   Achilles (S1) 2/4 Plantars: Right: downgoing   Left: downgoing Cerebellar: normal finger-to-nose, normal rapid alternating movements and heel-to-shin test unable to do due to decreased strength   Lab Results: Basic Metabolic Panel:  Recent Labs Lab 10/03/13 1018  NA 146  K 5.1  CL 105  CO2 25  GLUCOSE 140*  BUN 12  CREATININE 0.76  CALCIUM 9.2    Liver Function Tests:  Recent Labs Lab 10/04/13 0135  AST 391*  ALT 164*  ALKPHOS 48  BILITOT 0.9  PROT 5.6*  ALBUMIN 2.7*    Recent Labs Lab 10/04/13 0135  LIPASE 22   No results found for this basename: AMMONIA,  in the last 168 hours  CBC:  Recent Labs Lab 10/03/13 1018  WBC 7.4  HGB 17.2*  HCT 49.3  MCV 88.0  PLT 219    Cardiac Enzymes:  Recent Labs Lab 10/04/13 0135 10/04/13 0545  CKTOTAL 29476* 17021*  TROPONINI <0.30  --     Lipid Panel:  Recent Labs Lab 10/04/13 0135  CHOL 132  TRIG 170*  HDL 46  CHOLHDL 2.9  VLDL 34  LDLCALC 52    CBG: No results found for this basename: GLUCAP,  in the last 168  hours  Microbiology: Results for orders placed during the hospital encounter of 10/03/13  MRSA PCR SCREENING     Status: None   Collection Time    10/03/13 10:52 PM      Result Value Ref Range Status   MRSA by PCR NEGATIVE  NEGATIVE Final   Comment:            The GeneXpert MRSA Assay (FDA     approved for NASAL specimens     only), is one component of a     comprehensive MRSA colonization     surveillance program. It is not     intended to diagnose MRSA     infection nor to guide or     monitor treatment for     MRSA infections.    Coagulation Studies: No results found for this basename: LABPROT, INR,  in the last 72 hours  Imaging: Ct Head Wo Contrast  10/03/2013   CLINICAL DATA:  Dizziness with weakness from waist down.  EXAM: CT HEAD WITHOUT CONTRAST  TECHNIQUE: Contiguous axial images were obtained from the base of the skull through the vertex without intravenous contrast.  COMPARISON:  None.  FINDINGS: Skull and Sinuses:No acute osseous findings. Clear mastoids. Symmetric IACs.  Orbits: No acute abnormality.  Brain: No evidence of acute abnormality, such as acute large vessel infarction, hemorrhage, hydrocephalus, or mass lesion/mass effect. There is cerebral volume loss which is age commensurate. Subtle linear low-density in the mid left cerebellum, conspicuity consistent with established small vessel infarct.  IMPRESSION: 1. No acute intracranial findings. 2. 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   Electronically Signed   By: Jorje Guild M.D.   On: 10/03/2013 22:00   Mr Thoracic Spine Wo Contrast  10/03/2013   CLINICAL DATA:  Low back pain radiating down both legs for several weeks. Bilateral lower extremity weakness. Falls.  EXAM: MRI THORACIC AND LUMBAR SPINE WITHOUT CONTRAST  TECHNIQUE: Multiplanar and multiecho pulse sequences of the thoracic and lumbar spine were obtained without intravenous contrast.  COMPARISON:  CT abdomen and pelvis 10/03/2013. Chest  radiographs 08/19/2004. Lumbar spine MRI 05/23/2003  FINDINGS: MR THORACIC SPINE FINDINGS  Vertebral alignment is normal. Vertebral body heights are preserved without compression fracture. Bone marrow is diffusely heterogeneous with areas of mildly diminished T1 signal. No vertebral marrow edema is seen. At T4-5, there is the suggestion of slight kinking and ventral displacement of the spinal cord (series 7, image 7 and series 8, image 12). Just below this, minimal T2 hyperintense signal is questioned in the spinal cord at the T5 level.  Prominent dorsal epidural fat is present throughout the mid thoracic spine. Multilevel right-sided anterior osteophytosis is present in the  mid to lower thoracic spine. No posterior disc herniation or spinal canal or neural foraminal stenosis is identified. Paraspinal soft tissues are unremarkable.  MR LUMBAR SPINE FINDINGS  The same numbering employed on the prior lumbar spine MRI will be continued on this examination, with the last full intervertebral disc space being labeled L5-S1.  Vertebral body heights are preserved without compression fracture. As in the thoracic spine, vertebral bone marrow signal is mildly heterogeneous diffusely. No vertebral marrow edema is seen. Mild disc desiccation is present at L4-5 with at most minimal disc space narrowing. Other intervertebral disc space heights are preserved. Conus medullaris terminates at L1. Prominent dorsal epidural fat is present in the mid and upper lumbar spine. Visualized paraspinal soft tissues are unremarkable.  L1-2 and L2-3:  Negative.  L3-4: Mild disc bulge and facet and ligamentum flavum hypertrophy results in mild spinal canal narrowing, unchanged. No neural foraminal stenosis.  L4-5: Sequelae of interval laminectomy are identified. Central disc protrusion has increased from the prior study. Together with hypertrophy of the facets and residual posterior ligaments, this results in severe spinal stenosis. There is mild  right neural foraminal stenosis.  L5-S1:  Negative.  IMPRESSION: 1. 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 2. No thoracic disc herniation or spinal stenosis. 3. Possible slight kinking and anterior displacement of the spinal cord at T4-5. While this is subtle and the cord is not clearly atrophic at this level, spinal cord herniation is not excluded. Consider further evaluation with CT myelography as clinically indicated. 4. Diffusely heterogeneous bone marrow signal. Although this can be caused by marrow infiltrative processes, the most common causes include anemia, smoking, obesity, or advancing age.   Electronically Signed   By: Logan Bores   On: 10/03/2013 19:55   Mr Lumbar Spine Wo Contrast  10/03/2013   CLINICAL DATA:  Low back pain radiating down both legs for several weeks. Bilateral lower extremity weakness. Falls.  EXAM: MRI THORACIC AND LUMBAR SPINE WITHOUT CONTRAST  TECHNIQUE: Multiplanar and multiecho pulse sequences of the thoracic and lumbar spine were obtained without intravenous contrast.  COMPARISON:  CT abdomen and pelvis 10/03/2013. Chest radiographs 08/19/2004. Lumbar spine MRI 05/23/2003  FINDINGS: MR THORACIC SPINE FINDINGS  Vertebral alignment is normal. Vertebral body heights are preserved without compression fracture. Bone marrow is diffusely heterogeneous with areas of mildly diminished T1 signal. No vertebral marrow edema is seen. At T4-5, there is the suggestion of slight kinking and ventral displacement of the spinal cord (series 7, image 7 and series 8, image 12). Just below this, minimal T2 hyperintense signal is questioned in the spinal cord at the T5 level.  Prominent dorsal epidural fat is present throughout the mid thoracic spine. Multilevel right-sided anterior osteophytosis is present in the mid to lower thoracic spine. No posterior disc herniation or spinal canal or neural foraminal stenosis is identified. Paraspinal  soft tissues are unremarkable.  MR LUMBAR SPINE FINDINGS  The same numbering employed on the prior lumbar spine MRI will be continued on this examination, with the last full intervertebral disc space being labeled L5-S1.  Vertebral body heights are preserved without compression fracture. As in the thoracic spine, vertebral bone marrow signal is mildly heterogeneous diffusely. No vertebral marrow edema is seen. Mild disc desiccation is present at L4-5 with at most minimal disc space narrowing. Other intervertebral disc space heights are preserved. Conus medullaris terminates at L1. Prominent dorsal epidural fat is present in the mid  and upper lumbar spine. Visualized paraspinal soft tissues are unremarkable.  L1-2 and L2-3:  Negative.  L3-4: Mild disc bulge and facet and ligamentum flavum hypertrophy results in mild spinal canal narrowing, unchanged. No neural foraminal stenosis.  L4-5: Sequelae of interval laminectomy are identified. Central disc protrusion has increased from the prior study. Together with hypertrophy of the facets and residual posterior ligaments, this results in severe spinal stenosis. There is mild right neural foraminal stenosis.  L5-S1:  Negative.  IMPRESSION: 1. 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 2. No thoracic disc herniation or spinal stenosis. 3. Possible slight kinking and anterior displacement of the spinal cord at T4-5. While this is subtle and the cord is not clearly atrophic at this level, spinal cord herniation is not excluded. Consider further evaluation with CT myelography as clinically indicated. 4. Diffusely heterogeneous bone marrow signal. Although this can be caused by marrow infiltrative processes, the most common causes include anemia, smoking, obesity, or advancing age.   Electronically Signed   By: Logan Bores   On: 10/03/2013 19:55   Ct Abdomen Pelvis W Contrast  10/03/2013   CLINICAL DATA:  Dizziness, weakness   EXAM: CT ABDOMEN AND PELVIS WITH CONTRAST  TECHNIQUE: Multidetector CT imaging of the abdomen and pelvis was performed using the standard protocol following bolus administration of intravenous contrast.  CONTRAST:  30m OMNIPAQUE IOHEXOL 300 MG/ML  SOLN  COMPARISON:  None  FINDINGS: Lung bases are clear.  No pericardial fluid.  No focal hepatic lesion. Gallbladder, pancreas, spleen, adrenal glands, and kidneys are normal.  The stomach, small bowel, appendix, and cecum are normal. The colon demonstrates several scattered diverticula without acute inflammation. Rectum is normal.  Abdominal aorta is normal caliber. No retroperitoneal or periportal lymphadenopathy.  There is mild haziness to the central mesentery with several small mesenteric lymph nodes (image 42, series 2 for example).  There is no ventral hernia. Bilateral fat filled inguinal hernias are noted. No free fluid the pelvis. The prostate gland and bladder normal. No pelvic lymphadenopathy. No aggressive osseous lesion.  : IMPRESSION:  1. No acute findings in the abdomen or pelvis. 2. Scattered colonic diverticula without evidence of diverticulosis. 3. 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   Electronically Signed   By: SSuzy BouchardM.D.   On: 10/03/2013 16:00    Medications:  Scheduled: . aspirin EC  81 mg Oral Daily  . cefTRIAXone (ROCEPHIN)  IV  1 g Intravenous Q24H  . citalopram  5 mg Oral Daily  . famotidine  20 mg Oral BID  . lisinopril  10 mg Oral Daily  . metoprolol succinate  25 mg Oral BID  . pantoprazole (PROTONIX) IV  40 mg Intravenous Q24H  . sodium chloride  500 mL Intravenous Once  . vitamin B-12  1,000 mcg Oral Daily    Assessment/Plan:  73year old male with progressive lower extremity weakness. On exam he shows 5/5 strength but when asked to lift his legs he can only do so briefly. Patient remains to have preserved reflexes. ESR 30, [place] [address] and repeat  17021, CRP Pending.   Recommend: 1) Continue to treat elevated CK 2) PT 3) would hold off on LP at this time as patient has not worsened and continues to have preserved reflexes 4) will continue to follow exam to see if improves with resolving CK       LOS: 1  day   @LRSIGN @ 10/04/2013  12:34 PM

## 2013-10-04 NOTE — Evaluation (Signed)
Physical Therapy Evaluation Patient Details Name: Bradley French MRN: [id number] DOB: 1941-02-22 Today's Date: 10/04/2013   History of Present Illness  73 year old with weakness in bilateral lower extremities which per MD note developed a little over week ago. Per MD, about 3 weeks ago he had a flulike syndrome involving a GI bug. Pt has had 2 falls since onset of weakness. MRI demonstratesSevere spinal stenosis at L4-5 due to increased size of a disc.  Clinical Impression  Pt adm due to above. Pt presents with decreased independence with mobility and balance deficits. Pt to benefit from skilled acute PT to address deficits and maximize functional mobility prior to D/C to next venue. Spoke with OT, who saw pt later in the day; pt then required 2 person (A) for safety. Will hold off on D/C disposition recommendation at this time and further assess with next visit.     Follow Up Recommendations Supervision/Assistance - 24 hour (will determine with progress home vs CIR? )    Equipment Recommendations  Other (comment) (possibly RW ?)    Recommendations for Other Services       Precautions / Restrictions Precautions Precautions: Fall Precaution Comments: 2 falls in past week Restrictions Weight Bearing Restrictions: No      Mobility  Bed Mobility Overal bed mobility: Modified Independent             General bed mobility comments: relied on handrails and HOB elevated  Transfers Overall transfer level: Needs assistance Equipment used: None Transfers: Sit to/from Stand Sit to Stand: Min guard         General transfer comment: min guard to steady; pt with initial sway; cues for safety   Ambulation/Gait Ambulation/Gait assistance: Min assist Ambulation Distance (Feet): 150 Feet Assistive device: None;1 person hand held assist (support through gt belt ) Gait Pattern/deviations: Step-through pattern;Drifts right/left;Wide base of support Gait velocity: decreased Gait  velocity interpretation: Below normal speed for age/gender General Gait Details: pt with some balance disturbances during gt and required min (A) to maintain balance; difficulty with head turning and directional changes; cues for safety; may benefit from ambulating with RW   Stairs            Wheelchair Mobility    Modified Rankin (Stroke Patients Only)       Balance Overall balance assessment: History of Falls;Needs assistance Sitting-balance support: Feet supported;No upper extremity supported Sitting balance-Leahy Scale: Fair   Postural control: Posterior lean Standing balance support: During functional activity;No upper extremity supported Standing balance-Leahy Scale: Fair Standing balance comment: slight way initially with standing                              Pertinent Vitals/Pain Vitals stable.     Home Living Family/patient expects to be discharged to:: Private residence Living Arrangements: Spouse/significant other Available Help at Discharge: Family;Available 24 hours/day Type of Home: House Home Access: Stairs to enter Entrance Stairs-Rails: Right Entrance Stairs-Number of Steps: 4-6 Home Layout: Two level;Able to live on main level with bedroom/bathroom Home Equipment: Kasandra Knudsen - single point      Prior Function Level of Independence: Independent               Hand Dominance   Dominant Hand: Right    Extremity/Trunk Assessment   Upper Extremity Assessment: Overall WFL for tasks assessed           Lower Extremity Assessment: RLE deficits/detail RLE Deficits / Details: c/o  weakness; however when tested vs Lt LE pt WFL; able to WB through LEs     Cervical / Trunk Assessment: Normal  Communication   Communication: No difficulties  Cognition Arousal/Alertness: Awake/alert Behavior During Therapy: WFL for tasks assessed/performed Overall Cognitive Status: Within Functional Limits for tasks assessed                       General Comments      Exercises Other Exercises Other Exercises: educated pt on completing BLE AROM while sitting in chair Other Exercises: birdging in bed.  Other Exercises: G eliminated hip flexion in sidelying      Assessment/Plan    PT Assessment Patient needs continued PT services  PT Diagnosis Abnormality of gait;Generalized weakness   PT Problem List Decreased strength;Decreased activity tolerance;Decreased balance;Decreased mobility;Decreased knowledge of use of DME;Decreased safety awareness  PT Treatment Interventions DME instruction;Gait training;Stair training;Functional mobility training;Therapeutic activities;Therapeutic exercise;Balance training;Neuromuscular re-education;Patient/family education   PT Goals (Current goals can be found in the Care Plan section) Acute Rehab PT Goals Patient Stated Goal: to go home PT Goal Formulation: With patient Time For Goal Achievement: 10/11/13 Potential to Achieve Goals: Good    Frequency Min 4X/week   Barriers to discharge        Co-evaluation               End of Session Equipment Utilized During Treatment: Gait belt Activity Tolerance: Patient tolerated treatment well Patient left: in chair;with call bell/phone within reach Nurse Communication: Mobility status         Time: 0822-0842 PT Time Calculation (min): 20 min   Charges:   PT Evaluation $Initial PT Evaluation Tier I: 1 Procedure PT Treatments $Gait Training: 8-22 mins   PT G CodesKennis Carina [place], [place] 10/04/2013, 5:07 PM

## 2013-10-04 NOTE — Progress Notes (Signed)
Patient ID: Bradley French, male   DOB: 1941/02/20, 73 y.o.   MRN: [id number] Rhabdomyolysis noted. This certainly could explain his clinical situation. I would hold off on any further neuro work up until this is resolved. Please re consult if necessary.

## 2013-10-05 LAB — COMPREHENSIVE METABOLIC PANEL
ALT: 141 U/L — AB (ref 0–53)
AST: 255 U/L — AB (ref 0–37)
Albumin: 2.3 g/dL — ABNORMAL LOW (ref 3.5–5.2)
Alkaline Phosphatase: 44 U/L (ref 39–117)
BUN: 9 mg/dL (ref 6–23)
CO2: 22 mEq/L (ref 19–32)
Calcium: 7.7 mg/dL — ABNORMAL LOW (ref 8.4–10.5)
Chloride: 109 mEq/L (ref 96–112)
Creatinine, Ser: 0.65 mg/dL (ref 0.50–1.35)
GFR calc Af Amer: >90 mL/min (ref >90)
Glucose, Bld: 124 mg/dL — ABNORMAL HIGH (ref 70–99)
Potassium: 4.1 mEq/L (ref 3.7–5.3)
SODIUM: 141 meq/L (ref 137–147)
Total Bilirubin: 0.6 mg/dL (ref 0.3–1.2)
Total Protein: 5.1 g/dL — ABNORMAL LOW (ref 6.0–8.3)

## 2013-10-05 LAB — CBC
HEMATOCRIT: 41.1 % (ref 39.0–52.0)
HEMOGLOBIN: 13.6 g/dL (ref 13.0–17.0)
MCH: 29.4 pg (ref 26.0–34.0)
MCHC: 33.1 g/dL (ref 30.0–36.0)
MCV: 89 fL (ref 78.0–100.0)
Platelets: 194 10*3/uL (ref 150–400)
RBC: 4.62 MIL/uL (ref 4.22–5.81)
RDW: 13.7 % (ref 11.5–15.5)
WBC: 5.9 10*3/uL (ref 4.0–10.5)

## 2013-10-05 LAB — CK: CK TOTAL: 14356 U/L — AB (ref 7–232)

## 2013-10-05 MED ORDER — AMLODIPINE BESYLATE 5 MG PO TABS
5.0000 mg | ORAL_TABLET | Freq: Every day | ORAL | Status: DC
  Administered 2013-10-05 – 2013-10-07 (×3): 5 mg via ORAL
  Filled 2013-10-05 (×3): qty 1

## 2013-10-05 MED ORDER — SODIUM CHLORIDE 0.9 % IV SOLN
INTRAVENOUS | Status: AC
  Administered 2013-10-05 – 2013-10-06 (×4): via INTRAVENOUS

## 2013-10-05 NOTE — Progress Notes (Signed)
Subjective: Feeling slightly stronger today.  Walked with Therapy in hall and at one point did not use the cane.  Still having difficulty with bilateral hip flexion.   [place] [address] CRP 7.4  Objective: Current vital signs: BP 149/80  Pulse 64  Temp(Src) 98.6 F (37 C) (Oral)  Resp 20  Ht 6' 4" (1.93 m)  Wt 105.6 kg (232 lb 12.9 oz)  BMI 28.35 kg/m2  SpO2 99% Vital signs in last 24 hours: Temp:  [97.8 F (36.6 C)-98.6 F (37 C)] 98.6 F (37 C) (05/07 1610) Pulse Rate:  [64-75] 64 (05/07 0952) Resp:  [15-20] 20 (05/07 0952) BP: (140-154)/(57-80) 149/80 mmHg (05/07 0952) SpO2:  [94 %-99 %] 99 % (05/07 0952)  Intake/Output from previous day: 05/06 0701 - 05/07 0700 In: 2516 [P.O.:1320; I.V.:1146; IV Piggyback:50] Out: 9604 [Urine:1625] Intake/Output this shift: Total I/O In: 120 [P.O.:120] Out: 225 [Urine:225] Nutritional status: Cardiac  Neurologic Exam: Mental Status: Alert, oriented, thought content appropriate.  Speech fluent without evidence of aphasia.  Able to follow 3 step commands without difficulty. Cranial Nerves: II: visual fields grossly normal, pupils equal, round, reactive to light and accommodation III,IV, VI: ptosis not present, extraocular muscles extra-ocular motions intact bilaterally V,VII: smile symmetric, facial light touch sensation normal bilaterally VIII: hearing normal bilaterally IX,X: gag reflex present XI: trapezius strength/neck flexion strength normal bilaterally XII: tongue strength normal  Motor: Right : Upper extremity    Left:     Upper extremity 5/5 deltoid       5/5 deltoid 5/5 tricep      5/5 tricep 5/5 biceps      5/5 biceps  5/5wrist flexion     5/5 wrist flexion 5/5 wrist extension     5/5 wrist extension 5/5 hand grip      5/5 hand grip  Lower extremity     Lower extremity 3/5 hip flexor      3/5 hip flexor 3/5 hip adductors     3/5 hip adductors 5/5 hip abductors     5/5 hip abductors 5/5 quadricep      5/5  quadriceps  4/5 hamstrings     4/5 hamstrings 5/5 plantar flexion       5/5 plantar flexion 5/5 plantar extension     5/5 plantar extension Tone and bulk:normal tone throughout; no atrophy noted Sensory: Pinprick and light touch intact throughout, bilaterally Deep Tendon Reflexes:  Right: Upper Extremity   Left: Upper extremity   biceps (C-5 to C-6) 2/4   biceps (C-5 to C-6) 2/4 tricep (C7) 2/4    triceps (C7) 2/4 Brachioradialis (C6) 2/4  Brachioradialis (C6) 2/4  Lower Extremity Lower Extremity  quadriceps (L-2 to L-4) 2/4   quadriceps (L-2 to L-4) 2/4 Achilles (S1) 2/4   Achilles (S1) 2/4 Plantars: Right: downgoing   Left: downgoing Cerebellar: normal finger-to-nose,heel to shin difficult due to weakness.   Lab Results: Basic Metabolic Panel:  Recent Labs Lab 10/03/13 1018 10/05/13 0430  NA 146 141  K 5.1 4.1  CL 105 109  CO2 25 22  GLUCOSE 140* 124*  BUN 12 9  CREATININE 0.76 0.65  CALCIUM 9.2 7.7*    Liver Function Tests:  Recent Labs Lab 10/04/13 0135 10/05/13 0430  AST 391* 255*  ALT 164* 141*  ALKPHOS 48 44  BILITOT 0.9 0.6  PROT 5.6* 5.1*  ALBUMIN 2.7* 2.3*    Recent Labs Lab 10/04/13 0135  LIPASE 22   No results found for this basename: AMMONIA,  in the last 168 hours  CBC:  Recent Labs Lab 10/03/13 1018 10/05/13 0430  WBC 7.4 5.9  HGB 17.2* 13.6  HCT 49.3 41.1  MCV 88.0 89.0  PLT 219 194    Cardiac Enzymes:  Recent Labs Lab 10/04/13 0135 10/04/13 0545 10/04/13 1550 10/05/13 0430  CKTOTAL 53664* 17021* 18235* 14356*  TROPONINI <0.30  --   --   --     Lipid Panel:  Recent Labs Lab 10/04/13 0135  CHOL 132  TRIG 170*  HDL 46  CHOLHDL 2.9  VLDL 34  LDLCALC 52    CBG: No results found for this basename: GLUCAP,  in the last 168 hours  Microbiology: Results for orders placed during the hospital encounter of 10/03/13  MRSA PCR SCREENING     Status: None   Collection Time    10/03/13 10:52 PM      Result Value  Ref Range Status   MRSA by PCR NEGATIVE  NEGATIVE Final   Comment:            The GeneXpert MRSA Assay (FDA     approved for NASAL specimens     only), is one component of a     comprehensive MRSA colonization     surveillance program. It is not     intended to diagnose MRSA     infection nor to guide or     monitor treatment for     MRSA infections.  URINE CULTURE     Status: None   Collection Time    10/04/13  1:04 AM      Result Value Ref Range Status   Specimen Description URINE, CLEAN CATCH   Final   Special Requests NONE   Final   Culture  Setup Time     Final   Value: 10/03/2013 02:28     Performed at [facility]     Final   Value: NO GROWTH     Performed at [facility]   Culture     Final   Value: NO GROWTH     Performed at [facility]   Report Status 10/04/2013 FINAL   Final    Coagulation Studies: No results found for this basename: LABPROT, INR,  in the last 72 hours  Imaging: Ct Head Wo Contrast  10/03/2013   CLINICAL DATA:  Dizziness with weakness from waist down.  EXAM: CT HEAD WITHOUT CONTRAST  TECHNIQUE: Contiguous axial images were obtained from the base of the skull through the vertex without intravenous contrast.  COMPARISON:  None.  FINDINGS: Skull and Sinuses:No acute osseous findings. Clear mastoids. Symmetric IACs.  Orbits: No acute abnormality.  Brain: No evidence of acute abnormality, such as acute large vessel infarction, hemorrhage, hydrocephalus, or mass lesion/mass effect. There is cerebral volume loss which is age commensurate. Subtle linear low-density in the mid left cerebellum, conspicuity consistent with established small vessel infarct.  IMPRESSION: 1. No acute intracranial findings. 2. 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   Electronically Signed   By: Jorje Guild M.D.   On: 10/03/2013 22:00   Mr Thoracic Spine Wo Contrast  10/03/2013   CLINICAL DATA:  Low back pain radiating down  both legs for several weeks. Bilateral lower extremity weakness. Falls.  EXAM: MRI THORACIC AND LUMBAR SPINE WITHOUT CONTRAST  TECHNIQUE: Multiplanar and multiecho pulse sequences of the thoracic and lumbar spine were obtained without intravenous contrast.  COMPARISON:  CT abdomen and  pelvis 10/03/2013. Chest radiographs 08/19/2004. Lumbar spine MRI 05/23/2003  FINDINGS: MR THORACIC SPINE FINDINGS  Vertebral alignment is normal. Vertebral body heights are preserved without compression fracture. Bone marrow is diffusely heterogeneous with areas of mildly diminished T1 signal. No vertebral marrow edema is seen. At T4-5, there is the suggestion of slight kinking and ventral displacement of the spinal cord (series 7, image 7 and series 8, image 12). Just below this, minimal T2 hyperintense signal is questioned in the spinal cord at the T5 level.  Prominent dorsal epidural fat is present throughout the mid thoracic spine. Multilevel right-sided anterior osteophytosis is present in the mid to lower thoracic spine. No posterior disc herniation or spinal canal or neural foraminal stenosis is identified. Paraspinal soft tissues are unremarkable.  MR LUMBAR SPINE FINDINGS  The same numbering employed on the prior lumbar spine MRI will be continued on this examination, with the last full intervertebral disc space being labeled L5-S1.  Vertebral body heights are preserved without compression fracture. As in the thoracic spine, vertebral bone marrow signal is mildly heterogeneous diffusely. No vertebral marrow edema is seen. Mild disc desiccation is present at L4-5 with at most minimal disc space narrowing. Other intervertebral disc space heights are preserved. Conus medullaris terminates at L1. Prominent dorsal epidural fat is present in the mid and upper lumbar spine. Visualized paraspinal soft tissues are unremarkable.  L1-2 and L2-3:  Negative.  L3-4: Mild disc bulge and facet and ligamentum flavum hypertrophy results in  mild spinal canal narrowing, unchanged. No neural foraminal stenosis.  L4-5: Sequelae of interval laminectomy are identified. Central disc protrusion has increased from the prior study. Together with hypertrophy of the facets and residual posterior ligaments, this results in severe spinal stenosis. There is mild right neural foraminal stenosis.  L5-S1:  Negative.  IMPRESSION: 1. 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 2. No thoracic disc herniation or spinal stenosis. 3. Possible slight kinking and anterior displacement of the spinal cord at T4-5. While this is subtle and the cord is not clearly atrophic at this level, spinal cord herniation is not excluded. Consider further evaluation with CT myelography as clinically indicated. 4. Diffusely heterogeneous bone marrow signal. Although this can be caused by marrow infiltrative processes, the most common causes include anemia, smoking, obesity, or advancing age.   Electronically Signed   By: Logan Bores   On: 10/03/2013 19:55   Mr Lumbar Spine Wo Contrast  10/03/2013   CLINICAL DATA:  Low back pain radiating down both legs for several weeks. Bilateral lower extremity weakness. Falls.  EXAM: MRI THORACIC AND LUMBAR SPINE WITHOUT CONTRAST  TECHNIQUE: Multiplanar and multiecho pulse sequences of the thoracic and lumbar spine were obtained without intravenous contrast.  COMPARISON:  CT abdomen and pelvis 10/03/2013. Chest radiographs 08/19/2004. Lumbar spine MRI 05/23/2003  FINDINGS: MR THORACIC SPINE FINDINGS  Vertebral alignment is normal. Vertebral body heights are preserved without compression fracture. Bone marrow is diffusely heterogeneous with areas of mildly diminished T1 signal. No vertebral marrow edema is seen. At T4-5, there is the suggestion of slight kinking and ventral displacement of the spinal cord (series 7, image 7 and series 8, image 12). Just below this, minimal T2 hyperintense signal is  questioned in the spinal cord at the T5 level.  Prominent dorsal epidural fat is present throughout the mid thoracic spine. Multilevel right-sided anterior osteophytosis is present in the mid to lower thoracic spine. No posterior disc herniation or  spinal canal or neural foraminal stenosis is identified. Paraspinal soft tissues are unremarkable.  MR LUMBAR SPINE FINDINGS  The same numbering employed on the prior lumbar spine MRI will be continued on this examination, with the last full intervertebral disc space being labeled L5-S1.  Vertebral body heights are preserved without compression fracture. As in the thoracic spine, vertebral bone marrow signal is mildly heterogeneous diffusely. No vertebral marrow edema is seen. Mild disc desiccation is present at L4-5 with at most minimal disc space narrowing. Other intervertebral disc space heights are preserved. Conus medullaris terminates at L1. Prominent dorsal epidural fat is present in the mid and upper lumbar spine. Visualized paraspinal soft tissues are unremarkable.  L1-2 and L2-3:  Negative.  L3-4: Mild disc bulge and facet and ligamentum flavum hypertrophy results in mild spinal canal narrowing, unchanged. No neural foraminal stenosis.  L4-5: Sequelae of interval laminectomy are identified. Central disc protrusion has increased from the prior study. Together with hypertrophy of the facets and residual posterior ligaments, this results in severe spinal stenosis. There is mild right neural foraminal stenosis.  L5-S1:  Negative.  IMPRESSION: 1. 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 2. No thoracic disc herniation or spinal stenosis. 3. Possible slight kinking and anterior displacement of the spinal cord at T4-5. While this is subtle and the cord is not clearly atrophic at this level, spinal cord herniation is not excluded. Consider further evaluation with CT myelography as clinically indicated. 4. Diffusely  heterogeneous bone marrow signal. Although this can be caused by marrow infiltrative processes, the most common causes include anemia, smoking, obesity, or advancing age.   Electronically Signed   By: Logan Bores   On: 10/03/2013 19:55   Ct Abdomen Pelvis W Contrast  10/03/2013   CLINICAL DATA:  Dizziness, weakness  EXAM: CT ABDOMEN AND PELVIS WITH CONTRAST  TECHNIQUE: Multidetector CT imaging of the abdomen and pelvis was performed using the standard protocol following bolus administration of intravenous contrast.  CONTRAST:  36m OMNIPAQUE IOHEXOL 300 MG/ML  SOLN  COMPARISON:  None  FINDINGS: Lung bases are clear.  No pericardial fluid.  No focal hepatic lesion. Gallbladder, pancreas, spleen, adrenal glands, and kidneys are normal.  The stomach, small bowel, appendix, and cecum are normal. The colon demonstrates several scattered diverticula without acute inflammation. Rectum is normal.  Abdominal aorta is normal caliber. No retroperitoneal or periportal lymphadenopathy.  There is mild haziness to the central mesentery with several small mesenteric lymph nodes (image 42, series 2 for example).  There is no ventral hernia. Bilateral fat filled inguinal hernias are noted. No free fluid the pelvis. The prostate gland and bladder normal. No pelvic lymphadenopathy. No aggressive osseous lesion.  : IMPRESSION:  1. No acute findings in the abdomen or pelvis. 2. Scattered colonic diverticula without evidence of diverticulosis. 3. 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   Electronically Signed   By: SSuzy BouchardM.D.   On: 10/03/2013 16:00    Medications:  Scheduled: . amLODipine  5 mg Oral Daily  . aspirin EC  81 mg Oral Daily  . cefTRIAXone (ROCEPHIN)  IV  1 g Intravenous Q24H  . citalopram  5 mg Oral Daily  . enoxaparin (LOVENOX) injection  40 mg Subcutaneous Q24H  . famotidine  20 mg Oral BID  . metoprolol succinate  25 mg Oral BID  .  pantoprazole  40 mg Oral QHS  .  vitamin B-12  1,000 mcg Oral Daily    Assessment/Plan: CK remains elevated.  CRP amd ESR elevated as well.  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  There are no complaints of pain.    Recommendations: 1.  Muscle biopsy 2.  Agree with continued hydration and PT 3.  TSH    LOS: 2 days   Alexis Goodell, MD [facility] [phone]  10/05/2013  11:37 AM

## 2013-10-05 NOTE — Progress Notes (Signed)
Progress Note  Bradley French FBP:[id number] DOB: 1940/07/12 DOA: 10/03/2013 PCP: Pcp Not In System  Admit HPI / Brief Narrative: 73 y.o. M w/ history of CAD status post CABG, OSA, hypertension, hyperlipidemia, and GI bleed who was brought to the ER with increasing weakness of the lower extremities. Patient had been experiencing weakness over the preceding few weeks which became more severe the week prior to his presentation leading to at least 2 falls. Denied having hit his head or loss consciousness. Denied having any incontinence of urine or bowels.   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 MRIs of the T-spine and L-spine were done which showed kinking at T4-5 level and also spinal stenosis at L4-5 level. Neurosurgeon on call was consulted and felt patient's weakness could not be from the kinking at T4-5. CT head was negative for acute findings. Patient was able to move his upper extremities without any difficulty and had no difficulty speaking swallowing or with any visual problems.     HPI/Subjective:  The patient reports no new complaints today.  He says his legs still feel very weak, but he was able to walk in the hallway with physical therapy today.  He denies bowel or bladder incontinence.  He denies chest pain shortness of breath abdominal pain or headache. His legs are somewhat sore but better than before.   Assessment/Plan:  Rhabdomyolysis  CK markedly elevated at peak of ~19K - trending downward - keep hydrated - most likely culprit appears to be his Zocor - I have counseled him that he should not take this medication unless cleared to do so by a physician - continue gentle hydration, check daily CK and BMP. Renal function stable at this time .    Idiopathic Progressive B LE weakness Appears to be due to rhabdomyolysis - continues to have preserved reflexes per Neuro, will hold off any further workup for  the next few weeks.   Transaminitis Secondary to rhabdomyolysis, repeat CMP in 4 weeks' time. Trending towards improvement.   Polycythemia Suspect the elevated hemoglobin is simply due to dehydration - stable with rehydration.   Hx of CAD Asymptomatic at present, no acute issues, continue beta blocker and aspirin.   COPD Well compensated, no acute issues no wheezing or shortness of breath.   OSA on CPAP Continue home CPAP regimen every night.   Equivocal UA UA not normal but also not strongly convincing of UTI - stop antibiotics on 10/06/2013 as urine culture negative.   HTN Control not ideal - we'll add low-dose Norvasc for better control.   HLD Diet management only for now as discussed above     Code Status: FULL Family Communication: Spoke with patient and wife at bedside Disposition Plan: Stable for transfer to a medical bed   Consultants: Neurology Neurosurgery  Procedures: None  Antibiotics: Ceftriaxone 5/5>>10/06/13  DVT prophylaxis: Lovenox  Objective: Blood pressure 154/74, pulse 70, temperature 98.4 F (36.9 C), temperature source Oral, resp. rate 18, height 6\' 4"  (1.93 m), weight 105.6 kg (232 lb 12.9 oz), SpO2 97.00%.  Intake/Output Summary (Last 24 hours) at 10/05/13 0936 Last data filed at 10/05/13 0606  Gross per 24 hour  Intake   1616 ml  Output   1425 ml  Net    191 ml   Exam: General: No acute respiratory distress Lungs: Clear to auscultation bilaterally without wheezes or crackles Cardiovascular: Regular rate and rhythm without murmur gallop or rub  normal S1 and S2 Abdomen: Nontender, nondistended, soft, bowel sounds positive, no rebound, no ascites, no appreciable mass Extremities: No significant cyanosis, clubbing, or edema bilateral lower extremities Neurologic:   no focal deficits  Data Reviewed: Basic Metabolic Panel:  Recent Labs Lab 10/03/13 1018 10/05/13 0430  NA 146 141  K 5.1 4.1  CL 105 109  CO2 25 22    GLUCOSE 140* 124*  BUN 12 9  CREATININE 0.76 0.65  CALCIUM 9.2 7.7*   Liver Function Tests:  Recent Labs Lab 10/04/13 0135 10/05/13 0430  AST 391* 255*  ALT 164* 141*  ALKPHOS 48 44  BILITOT 0.9 0.6  PROT 5.6* 5.1*  ALBUMIN 2.7* 2.3*    Recent Labs Lab 10/04/13 0135  LIPASE 22   CBC:  Recent Labs Lab 10/03/13 1018 10/05/13 0430  WBC 7.4 5.9  HGB 17.2* 13.6  HCT 49.3 41.1  MCV 88.0 89.0  PLT 219 194   Cardiac Enzymes:  Recent Labs Lab 10/04/13 0135 10/04/13 0545 10/04/13 1550 10/05/13 0430  CKTOTAL 64332* 17021* 18235* 14356*  TROPONINI <0.30  --   --   --     Recent Results (from the past 240 hour(s))  MRSA PCR SCREENING     Status: None   Collection Time    10/03/13 10:52 PM      Result Value Ref Range Status   MRSA by PCR NEGATIVE  NEGATIVE Final   Comment:            The GeneXpert MRSA Assay (FDA     approved for NASAL specimens     only), is one component of a     comprehensive MRSA colonization     surveillance program. It is not     intended to diagnose MRSA     infection nor to guide or     monitor treatment for     MRSA infections.  URINE CULTURE     Status: None   Collection Time    10/04/13  1:04 AM      Result Value Ref Range Status   Specimen Description URINE, CLEAN CATCH   Final   Special Requests NONE   Final   Culture  Setup Time     Final   Value: 10/03/2013 02:28     Performed at [facility] Count     Final   Value: NO GROWTH     Performed at [facility]   Culture     Final   Value: NO GROWTH     Performed at [facility]   Report Status 10/04/2013 FINAL   Final     Studies:  Recent x-ray studies have been reviewed in detail by the Attending Physician  Scheduled Meds:  Scheduled Meds: . aspirin EC  81 mg Oral Daily  . cefTRIAXone (ROCEPHIN)  IV  1 g Intravenous Q24H  . citalopram  5 mg Oral Daily  . enoxaparin (LOVENOX) injection  40 mg Subcutaneous Q24H  . famotidine   20 mg Oral BID  . metoprolol succinate  25 mg Oral BID  . pantoprazole  40 mg Oral QHS  . vitamin B-12  1,000 mcg Oral Daily    Time spent on care of this patient: 35 mins  Thurnell Lose M.D on 10/05/2013 at 9:40 AM  Between 7am to 7pm - Pager - [phone], After 7pm go to [email or web address] - password [email or web address]  And look for the night coverage person covering me after  hours  [facility]  Office  [phone]   LOS: 2 days

## 2013-10-05 NOTE — Progress Notes (Signed)
Occupational Therapy Treatment Patient Details Name: Bradley French MRN: [id number] DOB: Aug 31, 1940 Today's Date: 10/05/2013    History of present illness 73 year old with weakness in bilateral lower extremities which per MD note developed a little over week ago. Per MD, about 3 weeks ago he had a flulike syndrome involving a GI bug. Pt has had 2 falls since onset of weakness. MRI demonstratesSevere spinal stenosis at L4-5 due to increased size of a disc.   OT comments  Pt making progress with functional goals and should continue with acute OT services to increase level of function and safety. Pt able to use cane during session to ambulate to restroom with min guard A. Pt states that when he is tired and had been up for a long time, he will not perform as well in the afternoon as he did not last OT session with mobility  Follow Up Recommendations  Supervision/Assistance - 24 hour    Equipment Recommendations  Tub/shower bench;3 in 1 bedside comode;Other (comment) Management consultant)    Recommendations for Other Services      Precautions / Restrictions Precautions Precautions: Fall Precaution Comments: 2 falls in past week Restrictions Weight Bearing Restrictions: No       Mobility Bed Mobility Overal bed mobility: Modified Independent             General bed mobility comments: relied on handrails and HOB elevated  Transfers Overall transfer level: Needs assistance Equipment used: Rolling walker (2 wheeled);Straight cane Transfers: Sit to/from Stand Sit to Stand: Min guard              Balance Overall balance assessment: Needs assistance;History of Falls Sitting-balance support: No upper extremity supported;Feet supported Sitting balance-Leahy Scale: Good     Standing balance support: Single extremity supported;During functional activity Standing balance-Leahy Scale: Fair                     ADL               Lower Body Bathing: Minimal assistance;Sit  to/from stand;Min guard;Sitting/lateral leans       Lower Body Dressing: Minimal assistance;Sit to/from stand;Min guard;Sitting/lateral leans (able to doff socks, but required max A to donn socks)   Toilet Transfer: Min guard;Regular Toilet;Grab bars;Ambulation           Functional mobility during ADLs: Min guard General ADL Comments: pt started off using RW and was doing well, used cane after reaching bathroom door and pt continued to do well. provided pt with education and demonstration with tub bench and LH reacher for LB dressing      Vision  wears glasses                              Cognition   Behavior During Therapy: [facility] for tasks assessed/performed Overall Cognitive Status: Within Functional Limits for tasks assessed                                                  General Comments  Pt pleasant and cooperative    Pertinent Vitals/ Pain       No c/o pain, VSS  Prior Functioning/Environment  independent           Frequency Min 2X/week     Progress Toward Goals  OT Goals(current goals can now be found in the care plan section)  Progress towards OT goals: Progressing toward goals  Acute Rehab OT Goals Patient Stated Goal: to go home  Plan Discharge plan remains appropriate                     End of Session Equipment Utilized During Treatment: Gait belt;Other (comment);Rolling walker [facility])   Activity Tolerance Patient tolerated treatment well   Patient Left in chair;with call bell/phone within reach;with family/visitor present             Time: 3500-9381 OT Time Calculation (min): 20 min  Charges: OT General Charges $OT Visit: 1 Procedure OT Treatments $Self Care/Home Management : 8-22 mins  Mosetta Putt 10/05/2013, 1:50 PM

## 2013-10-05 NOTE — Progress Notes (Signed)
Physical Therapy Treatment Patient Details Name: Bradley French MRN: [id number] DOB: 07/08/1940 Today's Date: 10/05/2013    History of Present Illness 73 year old with weakness in bilateral lower extremities which per MD note developed a little over week ago. Per MD, about 3 weeks ago he had a flulike syndrome involving a GI bug. Pt has had 2 falls since onset of weakness. MRI demonstratesSevere spinal stenosis at L4-5 due to increased size of a disc.    PT Comments    Presentation continues to be inconsistent. Pt with decreased bil hip strength in sitting; however is able to clear LEs during gt. Pt scored 15 on DGI indicating pt is a high fall risk. Pt with difficulty with high level balance tasks during gt. D/C disposition updated to OPPT for neuro rehab. Wife reports pt will have 24/7 (A) upon acute D/C. Will cont to follow per POC.  Follow Up Recommendations  Outpatient PT;Supervision/Assistance - 24 hour (neuro OPPT )     Equipment Recommendations  None recommended by PT    Recommendations for Other Services       Precautions / Restrictions Precautions Precautions: Fall Precaution Comments: 2 falls in past week Restrictions Weight Bearing Restrictions: No    Mobility  Bed Mobility Overal bed mobility: Modified Independent             General bed mobility comments: pt relied on use of UEs to (A) his LEs into bed due to generalized weakness  Transfers Overall transfer level: Needs assistance Equipment used: Straight cane Transfers: Sit to/from Stand Sit to Stand: Supervision         General transfer comment: supervision for cues for safe sequencing and technique with [facility]  Ambulation/Gait Ambulation/Gait assistance: Min guard Ambulation Distance (Feet): 300 Feet Assistive device: Straight cane Gait Pattern/deviations: Step-through pattern;Decreased stride length;Wide base of support (externally rotated LEs ) Gait velocity: impulsively fast and unsafe; cues  to slow down for safety    General Gait Details: pt challenged with high level balance activiites, see DGI, cues for safe technique with SPC; min guard primarily when challenged with high level balance activies    Stairs Stairs: Yes Stairs assistance: Supervision Stair Management: One rail Left;Step to pattern;Forwards Number of Stairs: 2 General stair comments: cues for safe technique; no LOB noted   Wheelchair Mobility    Modified Rankin (Stroke Patients Only)       Balance Overall balance assessment: Needs assistance Sitting-balance support: Feet supported;No upper extremity supported Sitting balance-Leahy Scale: Good     Standing balance support: During functional activity;Single extremity supported Standing balance-Leahy Scale: Fair Standing balance comment: use of SPC                     Cognition Arousal/Alertness: Awake/alert Behavior During Therapy: Impulsive Overall Cognitive Status: Impaired/Different from baseline Area of Impairment: Safety/judgement         Safety/Judgement: Decreased awareness of safety     General Comments: pt impulsive at times; may be his baseline ?     Exercises      General Comments        Pertinent Vitals/Pain No c/o pain today.     Home Living                      Prior Function            PT Goals (current goals can now be found in the care plan section) Acute Rehab PT Goals Patient Stated Goal:  to go home PT Goal Formulation: With patient Time For Goal Achievement: 10/11/13 Potential to Achieve Goals: Good Progress towards PT goals: Progressing toward goals    Frequency       PT Plan Current plan remains appropriate    Co-evaluation             End of Session Equipment Utilized During Treatment: Gait belt Activity Tolerance: Patient tolerated treatment well Patient left: in bed;with call bell/phone within reach;with bed alarm set     Time: 1415-1440 PT Time Calculation  (min): 25 min  Charges:  [employer] Training: 23-37 mins                    G CodesKennis Carina French, [place]  [phone] 10/05/2013, 3:31 PM

## 2013-10-06 DIAGNOSIS — M6282 Rhabdomyolysis: Secondary | ICD-10-CM

## 2013-10-06 DIAGNOSIS — R740 Nonspecific elevation of levels of transaminase and lactic acid dehydrogenase [LDH]: Secondary | ICD-10-CM

## 2013-10-06 DIAGNOSIS — R7401 Elevation of levels of liver transaminase levels: Secondary | ICD-10-CM

## 2013-10-06 DIAGNOSIS — M6281 Muscle weakness (generalized): Secondary | ICD-10-CM

## 2013-10-06 LAB — LACTATE DEHYDROGENASE: LDH: 450 U/L — ABNORMAL HIGH (ref 94–250)

## 2013-10-06 LAB — BASIC METABOLIC PANEL
BUN: 8 mg/dL (ref 6–23)
CALCIUM: 7.7 mg/dL — AB (ref 8.4–10.5)
CO2: 25 mEq/L (ref 19–32)
Chloride: 106 mEq/L (ref 96–112)
Creatinine, Ser: 0.6 mg/dL (ref 0.50–1.35)
GFR calc Af Amer: >90 mL/min (ref >90)
GFR calc non Af Amer: >90 mL/min (ref >90)
Glucose, Bld: 112 mg/dL — ABNORMAL HIGH (ref 70–99)
Potassium: 3.8 mEq/L (ref 3.7–5.3)
Sodium: 142 mEq/L (ref 137–147)

## 2013-10-06 LAB — TSH: TSH: 3.65 u[IU]/mL (ref 0.350–4.500)

## 2013-10-06 LAB — CK: CK TOTAL: 15079 U/L — AB (ref 7–232)

## 2013-10-06 NOTE — Progress Notes (Signed)
Subjective: Patient reports improvement.  Ambulating independently.  Reports no pain.  Objective: Current vital signs: BP 146/76  Pulse 66  Temp(Src) 97.2 F (36.2 C) (Oral)  Resp 20  Ht 6\' 4"  (1.93 m)  Wt 105.6 kg (232 lb 12.9 oz)  BMI 28.35 kg/m2  SpO2 99% Vital signs in last 24 hours: Temp:  [97.2 F (36.2 C)-98.4 F (36.9 C)] 97.2 F (36.2 C) (05/08 1030) Pulse Rate:  [63-72] 66 (05/08 1030) Resp:  [18-20] 20 (05/08 1030) BP: (143-149)/(67-76) 146/76 mmHg (05/08 1030) SpO2:  [96 %-99 %] 99 % (05/08 1030)  Intake/Output from previous day: 05/07 0701 - 05/08 0700 In: 916.7 [P.O.:600; I.V.:316.7] Out: 2250 [Urine:2250] Intake/Output this shift: Total I/O In: 480 [P.O.:480] Out: -  Nutritional status: Cardiac  Neurologic Exam: Mental Status:  Alert, oriented, thought content appropriate. Speech fluent without evidence of aphasia. Able to follow 3 step commands without difficulty.  Cranial Nerves:  II: visual fields grossly normal, pupils equal, round, reactive to light and accommodation  III,IV, VI: ptosis not present, extraocular muscles extra-ocular motions intact bilaterally  V,VII: smile symmetric, facial light touch sensation normal bilaterally  VIII: hearing normal bilaterally  IX,X: gag reflex present  XI: trapezius strength/neck flexion strength normal bilaterally  XII: tongue strength normal  Motor:  5/5 in the bilateral upper extremities 4-/5 hip flexors bilaterally Sensory: Pinprick and light touch intact throughout, bilaterally  Deep Tendon Reflexes:  2+ throughout Plantars:  Right: downgoing   Left: downgoing  Cerebellar:  normal finger-to-nose   Lab Results: Basic Metabolic Panel:  Recent Labs Lab 10/03/13 1018 10/05/13 0430 10/06/13 0500  NA 146 141 142  K 5.1 4.1 3.8  CL 105 109 106  CO2 25 22 25   GLUCOSE 140* 124* 112*  BUN 12 9 8   CREATININE 0.76 0.65 0.60  CALCIUM 9.2 7.7* 7.7*    Liver Function Tests:  Recent Labs Lab  10/04/13 0135 10/05/13 0430  AST 391* 255*  ALT 164* 141*  ALKPHOS 48 44  BILITOT 0.9 0.6  PROT 5.6* 5.1*  ALBUMIN 2.7* 2.3*    Recent Labs Lab 10/04/13 0135  LIPASE 22   No results found for this basename: AMMONIA,  in the last 168 hours  CBC:  Recent Labs Lab 10/03/13 1018 10/05/13 0430  WBC 7.4 5.9  HGB 17.2* 13.6  HCT 49.3 41.1  MCV 88.0 89.0  PLT 219 194    Cardiac Enzymes:  Recent Labs Lab 10/04/13 0135 10/04/13 0545 10/04/13 1550 10/05/13 0430 10/06/13 0500  CKTOTAL 70263* 17021* 18235* 14356* 15079*  TROPONINI <0.30  --   --   --   --     Lipid Panel:  Recent Labs Lab 10/04/13 0135  CHOL 132  TRIG 170*  HDL 46  CHOLHDL 2.9  VLDL 34  LDLCALC 52    CBG: No results found for this basename: GLUCAP,  in the last 168 hours  Microbiology: Results for orders placed during the hospital encounter of 10/03/13  MRSA PCR SCREENING     Status: None   Collection Time    10/03/13 10:52 PM      Result Value Ref Range Status   MRSA by PCR NEGATIVE  NEGATIVE Final   Comment:            The GeneXpert MRSA Assay (FDA     approved for NASAL specimens     only), is one component of a     comprehensive MRSA colonization     surveillance program.  It is not     intended to diagnose MRSA     infection nor to guide or     monitor treatment for     MRSA infections.  URINE CULTURE     Status: None   Collection Time    10/04/13  1:04 AM      Result Value Ref Range Status   Specimen Description URINE, CLEAN CATCH   Final   Special Requests NONE   Final   Culture  Setup Time     Final   Value: 10/03/2013 02:28     Performed at [facility] Count     Final   Value: NO GROWTH     Performed at [facility]   Culture     Final   Value: NO GROWTH     Performed at [facility]   Report Status 10/04/2013 FINAL   Final    Coagulation Studies: No results found for this basename: LABPROT, INR,  in the last 72  hours  Imaging: No results found.  Medications:  I have reviewed the patient's current medications. Scheduled: . amLODipine  5 mg Oral Daily  . aspirin EC  81 mg Oral Daily  . citalopram  5 mg Oral Daily  . enoxaparin (LOVENOX) injection  40 mg Subcutaneous Q24H  . famotidine  20 mg Oral BID  . metoprolol succinate  25 mg Oral BID  . pantoprazole  40 mg Oral QHS  . vitamin B-12  1,000 mcg Oral Daily    Assessment/Plan: Patient clinically improving but CK not significantly improving despite continued IVF and minimal exertion.  TSH normal.  Recommendations: 1.  Muscle biopsy 2.  Continued PT and hydration   LOS: 3 days   Alexis Goodell, MD [facility] [phone] 10/06/2013  12:54 PM

## 2013-10-06 NOTE — Consult Note (Signed)
Reason for Consult: muscle biopsy, rhabdomyolysis  Referring Physician: Dr. Lala Lund    HPI: Bradley French is a 73 year old male with a history of HTN, HLD, CAD s/p CABG in 2006, COPD, OSA, GERD, depression who developed progressive weakness 1 week ago to lower extremities.  Associated with dark urine.  He denies new medication and reports taking a statin since 2009.  Since being hospitalized, he reports an improvement in his symptoms.  He denies any trauma.  Denies numbness/tingling, loss of bowel or bladder control.  In the ED a CT of abdomen did not acute abnormalities.  MRI a and T-spine CT showed spinal stenosis at L4-5, "possible slight kinking and anterior displacement of the spinal cord at T4-5." Neurosurgery consulted and did not feel like this was the etiology of his symptoms.  He was found to have a CK total of 19,588, AST 391 and ALT 164.  Statin therapy was stopped.  Neurology was consulted.  The patients symptoms improved.  Due to persistent elevating in CK total we have been asked to evaluate the patient for a muscle biopsy for diagnosis.    He denies chest pains or shortness of breath.  He is able to walk up a flight of stairs.  He has been followed by cardiology at at [facility] in [place], however, has not had any recent cardiology evaluation.  TCTS-Dr. Prescott Gum.    Past Medical History  Diagnosis Date  . Hypertension   . Hypercholesteremia   . Colon polyps ???    none on 2006 colonoscopy  . Hemorrhoid   . Diverticulosis of colon   . Gastric ulcer 2006    ? secondary to H Pylori, vs ASA.   Marland Kitchen H pylori ulcer 2006    treated with Prevpac  . Coronary artery disease   . Mental disorder   . COPD (chronic obstructive pulmonary disease)   . OSA on CPAP     diagnosed at [place] in [place]  . Cataract   . Depression     PHQ-9 positive at [facility] - score of 4-6  . GERD (gastroesophageal reflux disease)   . OA (osteoarthritis) of knee     left knee: tricompartmental deterioration. s/p steroid  injections  . Benign hematuria June '08    CT abdomen negative; cystoscopy - negative (at [facility])    Past Surgical History  Procedure Laterality Date  . Knee surgery      left knee  . 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 Hemorrhoid surgery  1981  . Hemilaminectomy  June '12    L4-5 level due to claudication  . Sternal wire removal  '09    Family History  Problem Relation Age of Onset  . Diabetes Brother   . Alcohol abuse Father   . Alcohol abuse Sister   . Cancer Neg Hx   . Heart disease Neg Hx   . Diabetes Brother   . Alcohol abuse Brother     Social History:  reports that he has been smoking Cigarettes.  He has been smoking about 0.50 packs per day. He has never used smokeless tobacco. He reports that he drinks about 3 ounces of alcohol per week. He reports that he does not use illicit drugs.  Allergies:  Allergies  Allergen Reactions  . Sulfonamide Derivatives Rash    Medications:  Scheduled Meds: . amLODipine  5 mg Oral Daily  . aspirin EC  81 mg Oral Daily  . citalopram  5 mg Oral Daily  . enoxaparin (LOVENOX) injection  40 mg Subcutaneous Q24H  . famotidine  20 mg Oral BID  . metoprolol succinate  25 mg Oral BID  . pantoprazole  40 mg Oral QHS  . vitamin B-12  1,000 mcg Oral Daily   Continuous Infusions: . sodium chloride 100 mL/hr at 10/06/13 1418   PRN Meds:.senna-docusate   Results for orders placed during the hospital encounter of 10/03/13 (from the past 48 hour(s))  CK     Status: Abnormal   Collection Time    10/04/13  3:50 PM      Result Value Ref Range   [place] CK 18235 (*) 7 - 232 U/L  CK     Status: Abnormal   Collection Time    10/05/13  4:30 AM      Result Value Ref Range   Total CK 14356 (*) 7 - 232 U/L  COMPREHENSIVE METABOLIC PANEL     Status: Abnormal   Collection Time    10/05/13  4:30 AM      Result Value Ref Range   Sodium 141  137 - 147 mEq/L   Potassium 4.1  3.7 - 5.3 mEq/L   Comment: DELTA CHECK NOTED    Chloride 109  96 - 112 mEq/L   CO2 22  19 - 32 mEq/L   Glucose, Bld 124 (*) 70 - 99 mg/dL   BUN 9  6 - 23 mg/dL   Creatinine, Ser 0.65  0.50 - 1.35 mg/dL   Calcium 7.7 (*) 8.4 - 10.5 mg/dL   Total Protein 5.1 (*) 6.0 - 8.3 g/dL   Albumin 2.3 (*) 3.5 - 5.2 g/dL   AST 255 (*) 0 - 37 U/L   Comment: HEMOLYSIS AT THIS LEVEL MAY AFFECT RESULT   ALT 141 (*) 0 - 53 U/L   Alkaline Phosphatase 44  39 - 117 U/L   Total Bilirubin 0.6  0.3 - 1.2 mg/dL   GFR calc non Af Amer >90  >90 mL/min   GFR calc Af Amer >90  >90 mL/min   Comment: (NOTE)     The eGFR has been calculated using the CKD EPI equation.     This calculation has not been validated in all clinical situations.     eGFR's persistently <90 mL/min signify possible Chronic Kidney     Disease.  CBC     Status: None   Collection Time    10/05/13  4:30 AM      Result Value Ref Range   WBC 5.9  4.0 - 10.5 K/uL   RBC 4.62  4.22 - 5.81 MIL/uL   Hemoglobin 13.6  13.0 - 17.0 g/dL   Comment: DELTA CHECK NOTED     REPEATED TO VERIFY   HCT 41.1  39.0 - 52.0 %   MCV 89.0  78.0 - 100.0 fL   MCH 29.4  26.0 - 34.0 pg   MCHC 33.1  30.0 - 36.0 g/dL   RDW 13.7  11.5 - 15.5 %   Platelets 194  150 - 400 K/uL  CK     Status: Abnormal   Collection Time    10/06/13  5:00 AM      Result Value Ref Range   Total CK 15079 (*) 7 - 232 U/L  BASIC METABOLIC PANEL     Status: Abnormal   Collection Time    10/06/13  5:00 AM      Result Value Ref Range   Sodium 142  137 - 147 mEq/L   Potassium 3.8  3.7 - 5.3 mEq/L   Chloride 106  96 - 112 mEq/L   CO2 25  19 - 32 mEq/L   Glucose, Bld 112 (*) 70 - 99 mg/dL   BUN 8  6 - 23 mg/dL   Creatinine, Ser 0.60  0.50 - 1.35 mg/dL   Calcium 7.7 (*) 8.4 - 10.5 mg/dL   GFR calc non Af Amer >90  >90 mL/min   GFR calc Af Amer >90  >90 mL/min   Comment: (NOTE)     The eGFR has been calculated using the CKD EPI equation.     This calculation has not been validated in all clinical situations.     eGFR's persistently  <90 mL/min signify possible Chronic Kidney     Disease.  TSH     Status: None   Collection Time    10/06/13  5:00 AM      Result Value Ref Range   TSH 3.650  0.350 - 4.500 uIU/mL   Comment: Please note change in reference range.    No results found.  Review of Systems  All other systems reviewed and are negative.  Blood pressure 148/73, pulse 68, temperature 98.5 F (36.9 C), temperature source Oral, resp. rate 18, height 6' 4"  (1.93 m), weight 232 lb 12.9 oz (105.6 kg), SpO2 98.00%. Physical Exam  Constitutional: He is oriented to person, place, and time. He appears well-developed and well-nourished. No distress.  HENT:  Head: Normocephalic and atraumatic.  Cardiovascular: Normal rate, regular rhythm, normal heart sounds and intact distal pulses.  Exam reveals no gallop.   No murmur heard. Respiratory: Effort normal and breath sounds normal. No respiratory distress. He has no wheezes. He has no rales. He exhibits no tenderness.  GI: Soft. Bowel sounds are normal. He exhibits no distension and no mass. There is no tenderness. There is no rebound and no guarding.  Musculoskeletal: Normal range of motion. He exhibits no edema and no tenderness.  Neurological: He is alert and oriented to person, place, and time. Coordination normal.  Push pulls are strong and equal.   Skin: Skin is warm and dry. He is not diaphoretic.  Psychiatric: He has a normal mood and affect. His behavior is normal. Judgment and thought content normal.    Assessment/Plan: Hx CAD s/p CABG 2006 OSA COPD HTN HLD  Bilateral lower extremity weakness Rhabdomyolysis Transaminitis At the earliest we would be able to do a muscle biopsy would be on Monday.  I have discussed this with the patient, his wife and Dr. Candiss Norse.   He needs cardiac clearance given his cardiac history  Will continue to follow.  Thank you for the consult.   Emina Riebock ANP-BC 10/06/2013, 2:17 PM   Agree with above. He had 2 vessel CABG  by Dr. Nils Pyle (by his memory) in 2006.  Not sure of the name of the cardiologist who follows him.  I cannot find where he has seen a cardiologist in [place].  Alphonsa Overall, MD, [facility] Surgery Pager: [phone] Office phone:  [phone]

## 2013-10-06 NOTE — Progress Notes (Signed)
Progress Note  Bradley French:[id number] DOB: 19-Aug-1940 DOA: 10/03/2013 PCP: Pcp Not In System  Admit HPI / Brief Narrative: 73 y.o. M w/ history of CAD status post CABG, OSA, hypertension, hyperlipidemia, and GI bleed who was brought to the ER with increasing weakness of the lower extremities. Patient had been experiencing weakness over the preceding few weeks which became more severe the week prior to his presentation leading to at least 2 falls. Denied having hit his head or loss consciousness. Denied having any incontinence of urine or bowels.    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 MRIs of the T-spine and L-spine were done which showed kinking at T4-5 level and also spinal stenosis at L4-5 level. Neurosurgeon on call was consulted and felt patient's weakness could not be from the kinking at T4-5. CT head was negative for acute findings. Patient was able to move his upper extremities without any difficulty and had no difficulty speaking swallowing or with any visual problems.      HPI/Subjective:  The patient reports no new complaints today.  He says his legs still feel very weak, but he was able to walk in the hallway with physical therapy today.  He denies bowel or bladder incontinence.  He denies chest pain shortness of breath abdominal pain or headache. His legs are somewhat sore but better than before.      Assessment/Plan:  Rhabdomyolysis  CK markedly elevated at peak of ~19K - trending downward - keep hydrated - most likely culprit appears to be his Zocor - I have recounseled him that he should not take this medication unless cleared to do so by a physician - continue gentle hydration, check daily CK and BMP. Renal function stable at this time . Weakness better.    Idiopathic Progressive Bilateral LE weakness Appears to be due to rhabdomyolysis - continues to have preserved reflexes per Neuro,  will hold off any further workup for the next few weeks. Improved with physical therapy which will be continued. Likely home in the morning with outpatient PT.   Transaminitis Secondary to rhabdomyolysis, repeat CMP in 4 weeks' time. Trending towards improvement.   Polycythemia Suspect the elevated hemoglobin is simply due to dehydration - stable with rehydration.   Hx of CAD Asymptomatic at present, no acute issues, continue beta blocker and aspirin.   COPD Well compensated, no acute issues no wheezing or shortness of breath.   OSA on CPAP Continue home CPAP regimen every night.   Equivocal UA UA not normal but also not strongly convincing of UTI - stop antibiotics on 10/06/2013 as urine culture negative.   HTN Control not ideal - we'll add low-dose Norvasc for better control.   HLD Diet management only for now as discussed above     Code Status: FULL Family Communication: Spoke with patient  Disposition Plan: Home with home PT in the morning, have ordered outpatient PT as well.  Consultants: Neurology Neurosurgery  Procedures: None  Antibiotics: Ceftriaxone 5/5>>10/06/13  DVT prophylaxis: Lovenox  Objective: Blood pressure 146/67, pulse 64, temperature 97.3 F (36.3 C), temperature source Oral, resp. rate 20, height 6\' 4"  (1.93 m), weight 105.6 kg (232 lb 12.9 oz), SpO2 99.00%.  Intake/Output Summary (Last 24 hours) at 10/06/13 0812 Last data filed at 10/06/13 0809  Gross per 24 hour  Intake 1276.67 ml  Output   2250 ml  Net -973.33 ml   Exam: General: No acute respiratory  distress Lungs: Clear to auscultation bilaterally without wheezes or crackles Cardiovascular: Regular rate and rhythm without murmur gallop or rub normal S1 and S2 Abdomen: Nontender, nondistended, soft, bowel sounds positive, no rebound, no ascites, no appreciable mass Extremities: No significant cyanosis, clubbing, or edema bilateral lower extremities Neurologic:   no focal  deficits, strength 4/5 in lower extremities, 5/5 in upper  Data Reviewed: Basic Metabolic Panel:  Recent Labs Lab 10/03/13 1018 10/05/13 0430 10/06/13 0500  NA 146 141 142  K 5.1 4.1 3.8  CL 105 109 106  CO2 25 22 25   GLUCOSE 140* 124* 112*  BUN 12 9 8   CREATININE 0.76 0.65 0.60  CALCIUM 9.2 7.7* 7.7*   Liver Function Tests:  Recent Labs Lab 10/04/13 0135 10/05/13 0430  AST 391* 255*  ALT 164* 141*  ALKPHOS 48 44  BILITOT 0.9 0.6  PROT 5.6* 5.1*  ALBUMIN 2.7* 2.3*    Recent Labs Lab 10/04/13 0135  LIPASE 22   CBC:  Recent Labs Lab 10/03/13 1018 10/05/13 0430  WBC 7.4 5.9  HGB 17.2* 13.6  HCT 49.3 41.1  MCV 88.0 89.0  PLT 219 194   Cardiac Enzymes:  Recent Labs Lab 10/04/13 0135 10/04/13 0545 10/04/13 1550 10/05/13 0430 10/06/13 0500  CKTOTAL 40981* 17021* 18235* 14356* 15079*  TROPONINI <0.30  --   --   --   --     Recent Results (from the past 240 hour(s))  MRSA PCR SCREENING     Status: None   Collection Time    10/03/13 10:52 PM      Result Value Ref Range Status   MRSA by PCR NEGATIVE  NEGATIVE Final   Comment:            The GeneXpert MRSA Assay (FDA     approved for NASAL specimens     only), is one component of a     comprehensive MRSA colonization     surveillance program. It is not     intended to diagnose MRSA     infection nor to guide or     monitor treatment for     MRSA infections.  URINE CULTURE     Status: None   Collection Time    10/04/13  1:04 AM      Result Value Ref Range Status   Specimen Description URINE, CLEAN CATCH   Final   Special Requests NONE   Final   Culture  Setup Time     Final   Value: 10/03/2013 02:28     Performed at [facility] Count     Final   Value: NO GROWTH     Performed at [facility]   Culture     Final   Value: NO GROWTH     Performed at [facility]   Report Status 10/04/2013 FINAL   Final     Studies:  Recent x-ray studies have been  reviewed in detail by the Attending Physician  Scheduled Meds:  Scheduled Meds: . amLODipine  5 mg Oral Daily  . aspirin EC  81 mg Oral Daily  . citalopram  5 mg Oral Daily  . enoxaparin (LOVENOX) injection  40 mg Subcutaneous Q24H  . famotidine  20 mg Oral BID  . metoprolol succinate  25 mg Oral BID  . pantoprazole  40 mg Oral QHS  . vitamin B-12  1,000 mcg Oral Daily    Time spent on care of this patient:  35 mins  Thurnell Lose M.D on 10/06/2013 at 8:12 AM  Between 7am to 7pm - Pager - [phone], After 7pm go to [email or web address] - password [email or web address]  And look for the night coverage person covering me after hours  [place]   LOS: 3 days

## 2013-10-06 NOTE — Progress Notes (Signed)
Physical Therapy Treatment Patient Details Name: Bradley French MRN: [id number] DOB: Aug 25, 1940 Today's Date: 10/06/2013    History of Present Illness 73 year old with weakness in bilateral lower extremities which per MD note developed a little over week ago. Per MD, about 3 weeks ago he had a flulike syndrome involving a GI bug. Pt has had 2 falls since onset of weakness. MRI demonstratesSevere spinal stenosis at L4-5 due to increased size of a disc.    PT Comments    Session focused on promoting mobility with high level balance activities and incorporating exercises for strengthening and to also incorporate balance activities. Pt very motivated to participate in therapy today. Hopes to D/C tomorrow. Will check in with pt prior to D/C as per POC.   Follow Up Recommendations  Outpatient PT;Supervision/Assistance - 24 hour     Equipment Recommendations  None recommended by PT    Recommendations for Other Services       Precautions / Restrictions Precautions Precautions: Fall Precaution Comments: 2 falls in past week Restrictions Weight Bearing Restrictions: No    Mobility  Bed Mobility Overal bed mobility: Modified Independent             General bed mobility comments: incr time; able to advance LEs to/off EOB from supine to sit   Transfers Overall transfer level: Needs assistance Equipment used: Straight cane Transfers: Sit to/from Stand Sit to Stand: Supervision         General transfer comment: supervision for cues for safety; pt attempting to stand up without cane in reaching distances; block practice of sit to stand during session to progress to mod I and for LE strengthening (see exercises)   Ambulation/Gait Ambulation/Gait assistance: Supervision Ambulation Distance (Feet): 500 Feet Assistive device: Straight cane Gait Pattern/deviations: Step-through pattern;Narrow base of support (increased Lt arm swing ) Gait velocity: cues for safe ambulation speed;  pt with difficulty changing velocity    General Gait Details: pt challenged with high level balance activities with execise also incorporated.; no LOB noted during session    Stairs Stairs:  (reviewed proper technique; pt denied need to practice)          Wheelchair Mobility    Modified Rankin (Stroke Patients Only)       Balance Overall balance assessment: Needs assistance;History of Falls Sitting-balance support: Feet supported;No upper extremity supported Sitting balance-Leahy Scale: Good     Standing balance support: During functional activity;Single extremity supported Standing balance-Leahy Scale: Fair Standing balance comment: challenged with high level balance; marching without UE support for theraex and balance; slightly unstaedy with weightshift              High level balance activites: Sudden stops;Head turns;Turns;Other (comment);Side stepping (360 turn with incr time ) High Level Balance Comments: use of UEs for side stepping to Rt; pt fair with high level balance activiites requires incr time     Cognition Arousal/Alertness: Awake/alert Behavior During Therapy: Impulsive Overall Cognitive Status: Impaired/Different from baseline           Safety/Judgement: Decreased awareness of safety     General Comments: attempting to stand up without cane in reaching distance     Exercises General Exercises - Lower Extremity Hip Flexion/Marching: AROM;Both;10 reps;Standing Toe Raises: AROM;Both;10 reps;Standing Other Exercises Other Exercises: performed sit to stand with use of UEs; could not perform without UEs; x5     General Comments        Pertinent Vitals/Pain No c/o pain today     Home  Living                      Prior Function            PT Goals (current goals can now be found in the care plan section) Acute Rehab PT Goals Patient Stated Goal: home tomorrow  PT Goal Formulation: With patient Time For Goal Achievement:  10/11/13 Potential to Achieve Goals: Good Progress towards PT goals: Progressing toward goals    Frequency  Min 4X/week    PT Plan Current plan remains appropriate    Co-evaluation             End of Session Equipment Utilized During Treatment: Gait belt Activity Tolerance: Patient tolerated treatment well Patient left: in bed;with call bell/phone within reach;with bed alarm set;with family/visitor present     Time: 9518-8416 PT Time Calculation (min): 24 min  Charges:  $Gait Training: 8-22 mins $Therapeutic Exercise: 8-22 mins                    G CodesKennis Carina [place], [place]  [phone] 10/06/2013, 11:45 AM

## 2013-10-06 NOTE — Care Management Note (Unsigned)
    Page 1 of 2   10/06/2013     2:44:37 PM CARE MANAGEMENT NOTE 10/06/2013  Patient:  Bradley French, Bradley French   Account Number:  [id number]  Date Initiated:  10/04/2013  Documentation initiated by:  Marvetta Gibbons  Subjective/Objective Assessment:   Pt admitted with dizziness, elevated cks, lumbar stenosis. progressive lower extremity weakness.     Action/Plan:   PTA pt lved at home with spouse- NCM to follow for d/c needs   Anticipated DC Date:  10/06/2013   Anticipated DC Plan:  [place]  CM consult      Choice offered to / List presented to:     DME arranged  [facility]  3-N-1  [facility]  OTHER - SEE COMMENT        HH arranged  HH-2 PT  HH-4 NURSE'S AIDE      Status of service:  In process, will continue to follow Medicare Important Message given?   (If response is "NO", the following Medicare IM given date fields will be blank) Date Medicare IM given:   Date Additional Medicare IM given:    Discharge Disposition:    Per UR Regulation:  Reviewed for med. necessity/level of care/duration of stay  If discussed at [place] of Stay Meetings, dates discussed:    Comments:  10/06/13 [place], MSN, CM- Per [facility], patient does not follow with them.  CM contacted [facility], who states patient does follow with them.  CM was transferred to [phone] and [phone] for assistance. Voicemails were left at both extensions, but not return calls recieved.  CM updated patient, who will now be having a biopsy and will not discharge until Monday.  CM will continue attempts to reach the [place].  Patient's wife has declined a [place] aide, as she will be home with him 24/7.  10/06/13 [place] RN, MSN, CM- Met with patient to discuss home health needs for discharge home tomorrow. Patient is agreeable to home health and would like the recommended DME items.  Patient follows at the [facility]. Voicemail was left for Vaughan Basta   [phone] ext [phone] requesting assistance with discharge planning.  Awaiting return call.

## 2013-10-07 LAB — BASIC METABOLIC PANEL
BUN: 10 mg/dL (ref 6–23)
CO2: 24 meq/L (ref 19–32)
Calcium: 8.2 mg/dL — ABNORMAL LOW (ref 8.4–10.5)
Chloride: 108 mEq/L (ref 96–112)
Creatinine, Ser: 0.55 mg/dL (ref 0.50–1.35)
GFR calc Af Amer: >90 mL/min (ref >90)
GFR calc non Af Amer: >90 mL/min (ref >90)
Glucose, Bld: 113 mg/dL — ABNORMAL HIGH (ref 70–99)
POTASSIUM: 3.8 meq/L (ref 3.7–5.3)
SODIUM: 143 meq/L (ref 137–147)

## 2013-10-07 LAB — MYOGLOBIN, URINE: MYOGLOBIN UR: 200000 ug/L — AB (ref <28)

## 2013-10-07 LAB — CK: Total CK: 11435 U/L — ABNORMAL HIGH (ref 7–232)

## 2013-10-07 MED ORDER — AMLODIPINE BESYLATE 10 MG PO TABS: 10.0000 mg | ORAL_TABLET | Freq: Every day | ORAL | Status: AC

## 2013-10-07 NOTE — Discharge Summary (Signed)
Bradley French, is a 73 y.o. male  DOB March 03, 1941  MRN [id number].  Admission date:  10/03/2013  Admitting Physician  Rise Patience, MD  Discharge Date:  10/07/2013   Primary MD  Pcp Not In System  Recommendations for primary care physician for things to follow:   Monitor renal function and CK, CK levels continue to rise consider outpatient muscle biopsy. Patient requested to follow with rheumatologist locally within 7-10 days.   Admission Diagnosis  Leg weakness, bilateral [729.89] Abnormal MRI, spinal cord [793.99] Hematuria [599.70]   Discharge Diagnosis  Leg weakness, bilateral [729.89] Abnormal MRI, spinal cord [793.99] Hematuria [599.70]    Principal Problem:   Lower extremity weakness Active Problems:   CORONARY ARTERY DISEASE   COPD   CORONARY ARTERY BYPASS GRAFT, TWO VESSEL, HX OF   OSA on CPAP   Abnormal MRI, spinal cord      Past Medical History  Diagnosis Date  . Hypertension   . Hypercholesteremia   . Colon polyps ???    none on 2006 colonoscopy  . Hemorrhoid   . Diverticulosis of colon   . Gastric ulcer 2006    ? secondary to H Pylori, vs ASA.   Marland Kitchen H pylori ulcer 2006    treated with Prevpac  . Coronary artery disease   . Mental disorder   . COPD (chronic obstructive pulmonary disease)   . OSA on CPAP     diagnosed at [place] in [place]  . Cataract   . Depression     PHQ-9 positive at [facility] - score of 4-6  . GERD (gastroesophageal reflux disease)   . OA (osteoarthritis) of knee     left knee: tricompartmental deterioration. s/p steroid injections  . Benign hematuria June '08    CT abdomen negative; cystoscopy - negative (at [facility])    Past Surgical History  Procedure Laterality Date  . Knee surgery      left knee  . 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 Hemorrhoid surgery  1981    . Hemilaminectomy  June '12    L4-5 level due to claudication  . Sternal wire removal  '09     Discharge Condition: Stable   Follow UP      Follow-up Information   Follow up with PCP at [facility]. Schedule an appointment as soon as possible for a visit in 1 week.      Follow up with Hennie Duos, MD. Schedule an appointment as soon as possible for a visit in 1 week.   Specialty:  Rheumatology   Contact information:   [place]  [place] [address] [phone]       Follow up with CCS,MD, MD. Schedule an appointment as soon as possible for a visit in 1 week. (if instructed by Dr Amil Amen)    Specialty:  General Surgery      Follow up with Earleen Newport, MD. Schedule an appointment as soon as possible for a visit in 2  weeks.   Specialty:  Neurosurgery   Contact information:   [address] [place] [address] [phone]         Discharge Instructions  and  Discharge Medications          Discharge Orders   Future Orders Complete By Expires   Ambulatory referral to Physical Therapy  As directed    Questions:     Iontophoresis - 4 mg/ml of dexamethasone:  No   T.E.N.S. Unit Evaluation and Dispense as Indicated:  No   Diet - low sodium heart healthy  As directed    Discharge instructions  As directed    Scheduling Instructions:   Follow with Primary MD Pcp Not In System in 7 days , keep your self well-hydrated  Get CBC, CMP, CK checked 7 days by Primary MD and again as instructed by your Primary MD.    Activity: As tolerated with Full fall precautions use walker/cane & assistance as needed   Disposition Home     Diet: Heart Healthy    For Heart failure patients - Check your Weight same time everyday, if you gain over 2 pounds, or you develop in leg swelling, experience more shortness of breath or chest pain, call your Primary MD immediately. Follow Cardiac Low Salt Diet and 1.8 lit/day fluid restriction.   On  your next visit with her primary care physician please Get Medicines reviewed and adjusted.  Please request your Prim.MD to go over all Hospital Tests and Procedure/Radiological results at the follow up, please get all Hospital records sent to your Prim MD by signing hospital release before you go home.   If you experience worsening of your admission symptoms, develop shortness of breath, life threatening emergency, suicidal or homicidal thoughts you must seek medical attention immediately by calling 911 or calling your MD immediately  if symptoms less severe.  You Must read complete instructions/literature along with all the possible adverse reactions/side effects for all the Medicines you take and that have been prescribed to you. Take any new Medicines after you have completely understood and accpet all the possible adverse reactions/side effects.   Do not drive and provide baby sitting services if your were admitted for syncope or siezures until you have seen by Primary MD or a Neurologist and advised to do so again.  Do not drive when taking Pain medications.    Do not take more than prescribed Pain, Sleep and Anxiety Medications  Special Instructions: If you have smoked or chewed Tobacco  in the last 2 yrs please stop smoking, stop any regular Alcohol  and or any Recreational drug use.  Wear Seat belts while driving.   Please note  You were cared for by a hospitalist during your hospital stay. If you have any questions about your discharge medications or the care you received while you were in the hospital after you are discharged, you can call the unit and asked to speak with the hospitalist on call if the hospitalist that took care of you is not available. Once you are discharged, your primary care physician will handle any further medical issues. Please note that NO REFILLS for any discharge medications will be authorized once you are discharged, as it is imperative that you return to  your primary care physician (or establish a relationship with a primary care physician if you do not have one) for your aftercare needs so that they can reassess your need for medications and monitor your lab values.  Increase activity slowly  As directed        Medication List    STOP taking these medications       BAYER BACK & BODY PAIN EX ST PO     ibuprofen 200 MG tablet  Commonly known as:  ADVIL,MOTRIN     lisinopril 20 MG tablet  Commonly known as:  PRINIVIL,ZESTRIL     simvastatin 80 MG tablet  Commonly known as:  ZOCOR      TAKE these medications       amLODipine 10 MG tablet  Commonly known as:  NORVASC  Take 1 tablet (10 mg total) by mouth daily.     aspirin EC 81 MG tablet  Take 81 mg by mouth daily.     citalopram 10 MG tablet  Commonly known as:  CELEXA  Take 5 mg by mouth daily. Take one-half tablet by mouth once every day.     metoprolol succinate 25 MG 24 hr tablet  Commonly known as:  TOPROL-XL  Take 25 mg by mouth 2 (two) times daily.     ranitidine 150 MG tablet  Commonly known as:  ZANTAC  Take 1 tablet (150 mg total) by mouth 2 (two) times daily.     vitamin B-12 1000 MCG tablet  Commonly known as:  CYANOCOBALAMIN  Take 1,000 mcg by mouth daily.          Diet and Activity recommendation: See Discharge Instructions above   Consults obtained - neurology   Major procedures and Radiology Reports - PLEASE review detailed and final reports for all details, in brief -     Ct Head Wo Contrast  10/03/2013   CLINICAL DATA:  Dizziness with weakness from waist down.  EXAM: CT HEAD WITHOUT CONTRAST  TECHNIQUE: Contiguous axial images were obtained from the base of the skull through the vertex without intravenous contrast.  COMPARISON:  None.  FINDINGS: Skull and Sinuses:No acute osseous findings. Clear mastoids. Symmetric IACs.  Orbits: No acute abnormality.  Brain: No evidence of acute abnormality, such as acute large vessel infarction,  hemorrhage, hydrocephalus, or mass lesion/mass effect. There is cerebral volume loss which is age commensurate. Subtle linear low-density in the mid left cerebellum, conspicuity consistent with established small vessel infarct.  IMPRESSION: 1. No acute intracranial findings. 2. 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   Electronically Signed   By: Jorje Guild M.D.   On: 10/03/2013 22:00   Mr Thoracic Spine Wo Contrast  10/03/2013   CLINICAL DATA:  Low back pain radiating down both legs for several weeks. Bilateral lower extremity weakness. Falls.  EXAM: MRI THORACIC AND LUMBAR SPINE WITHOUT CONTRAST  TECHNIQUE: Multiplanar and multiecho pulse sequences of the thoracic and lumbar spine were obtained without intravenous contrast.  COMPARISON:  CT abdomen and pelvis 10/03/2013. Chest radiographs 08/19/2004. Lumbar spine MRI 05/23/2003  FINDINGS: MR THORACIC SPINE FINDINGS  Vertebral alignment is normal. Vertebral body heights are preserved without compression fracture. Bone marrow is diffusely heterogeneous with areas of mildly diminished T1 signal. No vertebral marrow edema is seen. At T4-5, there is the suggestion of slight kinking and ventral displacement of the spinal cord (series 7, image 7 and series 8, image 12). Just below this, minimal T2 hyperintense signal is questioned in the spinal cord at the T5 level.  Prominent dorsal epidural fat is present throughout the mid thoracic spine. Multilevel right-sided anterior osteophytosis is present in the mid to lower thoracic spine. No posterior disc herniation or spinal  canal or neural foraminal stenosis is identified. Paraspinal soft tissues are unremarkable.  MR LUMBAR SPINE FINDINGS  The same numbering employed on the prior lumbar spine MRI will be continued on this examination, with the last full intervertebral disc space being labeled L5-S1.  Vertebral body heights are preserved without compression fracture. As in the thoracic spine,  vertebral bone marrow signal is mildly heterogeneous diffusely. No vertebral marrow edema is seen. Mild disc desiccation is present at L4-5 with at most minimal disc space narrowing. Other intervertebral disc space heights are preserved. Conus medullaris terminates at L1. Prominent dorsal epidural fat is present in the mid and upper lumbar spine. Visualized paraspinal soft tissues are unremarkable.  L1-2 and L2-3:  Negative.  L3-4: Mild disc bulge and facet and ligamentum flavum hypertrophy results in mild spinal canal narrowing, unchanged. No neural foraminal stenosis.  L4-5: Sequelae of interval laminectomy are identified. Central disc protrusion has increased from the prior study. Together with hypertrophy of the facets and residual posterior ligaments, this results in severe spinal stenosis. There is mild right neural foraminal stenosis.  L5-S1:  Negative.  IMPRESSION: 1. 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 2. No thoracic disc herniation or spinal stenosis. 3. Possible slight kinking and anterior displacement of the spinal cord at T4-5. While this is subtle and the cord is not clearly atrophic at this level, spinal cord herniation is not excluded. Consider further evaluation with CT myelography as clinically indicated. 4. Diffusely heterogeneous bone marrow signal. Although this can be caused by marrow infiltrative processes, the most common causes include anemia, smoking, obesity, or advancing age.   Electronically Signed   By: Logan Bores   On: 10/03/2013 19:55   Mr Lumbar Spine Wo Contrast  10/03/2013   CLINICAL DATA:  Low back pain radiating down both legs for several weeks. Bilateral lower extremity weakness. Falls.  EXAM: MRI THORACIC AND LUMBAR SPINE WITHOUT CONTRAST  TECHNIQUE: Multiplanar and multiecho pulse sequences of the thoracic and lumbar spine were obtained without intravenous contrast.  COMPARISON:  CT abdomen and pelvis 10/03/2013.  Chest radiographs 08/19/2004. Lumbar spine MRI 05/23/2003  FINDINGS: MR THORACIC SPINE FINDINGS  Vertebral alignment is normal. Vertebral body heights are preserved without compression fracture. Bone marrow is diffusely heterogeneous with areas of mildly diminished T1 signal. No vertebral marrow edema is seen. At T4-5, there is the suggestion of slight kinking and ventral displacement of the spinal cord (series 7, image 7 and series 8, image 12). Just below this, minimal T2 hyperintense signal is questioned in the spinal cord at the T5 level.  Prominent dorsal epidural fat is present throughout the mid thoracic spine. Multilevel right-sided anterior osteophytosis is present in the mid to lower thoracic spine. No posterior disc herniation or spinal canal or neural foraminal stenosis is identified. Paraspinal soft tissues are unremarkable.  MR LUMBAR SPINE FINDINGS  The same numbering employed on the prior lumbar spine MRI will be continued on this examination, with the last full intervertebral disc space being labeled L5-S1.  Vertebral body heights are preserved without compression fracture. As in the thoracic spine, vertebral bone marrow signal is mildly heterogeneous diffusely. No vertebral marrow edema is seen. Mild disc desiccation is present at L4-5 with at most minimal disc space narrowing. Other intervertebral disc space heights are preserved. Conus medullaris terminates at L1. Prominent dorsal epidural fat is present in the mid and upper lumbar spine. Visualized paraspinal soft tissues are unremarkable.  L1-2 and L2-3:  Negative.  L3-4: Mild disc bulge and facet and ligamentum flavum hypertrophy results in mild spinal canal narrowing, unchanged. No neural foraminal stenosis.  L4-5: Sequelae of interval laminectomy are identified. Central disc protrusion has increased from the prior study. Together with hypertrophy of the facets and residual posterior ligaments, this results in severe spinal stenosis. There is  mild right neural foraminal stenosis.  L5-S1:  Negative.  IMPRESSION: 1. 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 2. No thoracic disc herniation or spinal stenosis. 3. Possible slight kinking and anterior displacement of the spinal cord at T4-5. While this is subtle and the cord is not clearly atrophic at this level, spinal cord herniation is not excluded. Consider further evaluation with CT myelography as clinically indicated. 4. Diffusely heterogeneous bone marrow signal. Although this can be caused by marrow infiltrative processes, the most common causes include anemia, smoking, obesity, or advancing age.   Electronically Signed   By: Logan Bores   On: 10/03/2013 19:55   Ct Abdomen Pelvis W Contrast  10/03/2013   CLINICAL DATA:  Dizziness, weakness  EXAM: CT ABDOMEN AND PELVIS WITH CONTRAST  TECHNIQUE: Multidetector CT imaging of the abdomen and pelvis was performed using the standard protocol following bolus administration of intravenous contrast.  CONTRAST:  68mL OMNIPAQUE IOHEXOL 300 MG/ML  SOLN  COMPARISON:  None  FINDINGS: Lung bases are clear.  No pericardial fluid.  No focal hepatic lesion. Gallbladder, pancreas, spleen, adrenal glands, and kidneys are normal.  The stomach, small bowel, appendix, and cecum are normal. The colon demonstrates several scattered diverticula without acute inflammation. Rectum is normal.  Abdominal aorta is normal caliber. No retroperitoneal or periportal lymphadenopathy.  There is mild haziness to the central mesentery with several small mesenteric lymph nodes (image 42, series 2 for example).  There is no ventral hernia. Bilateral fat filled inguinal hernias are noted. No free fluid the pelvis. The prostate gland and bladder normal. No pelvic lymphadenopathy. No aggressive osseous lesion.  : IMPRESSION:  1. No acute findings in the abdomen or pelvis. 2. Scattered colonic diverticula without evidence of diverticulosis. 3.  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   Electronically Signed   By: Suzy Bouchard M.D.   On: 10/03/2013 16:00    Micro Results    Recent Results (from the past 240 hour(s))  MRSA PCR SCREENING     Status: None   Collection Time    10/03/13 10:52 PM      Result Value Ref Range Status   MRSA by PCR NEGATIVE  NEGATIVE Final   Comment:            The GeneXpert MRSA Assay (FDA     approved for NASAL specimens     only), is one component of a     comprehensive MRSA colonization     surveillance program. It is not     intended to diagnose MRSA     infection nor to guide or     monitor treatment for     MRSA infections.  URINE CULTURE     Status: None   Collection Time    10/04/13  1:04 AM      Result Value Ref Range Status   Specimen Description URINE, CLEAN CATCH   Final   Special Requests NONE   Final   Culture  Setup Time  Final   Value: 10/03/2013 02:28     Performed at [facility]     Final   Value: NO GROWTH     Performed at [facility]   Culture     Final   Value: NO GROWTH     Performed at [facility]   Report Status 10/04/2013 FINAL   Final     History of present illness and  Hospital Course:     Kindly see H&P for history of present illness and admission details, please review complete Labs, Consult reports and Test reports for all details in brief BRAVEN WOLK, is a 73 y.o. male, patient with history of   CAD status post CABG, OSA, hypertension and hyperlipidemia, history of GI bleed was brought to the ER patient was found to be having increasing weakness of the lower extremity. Patient has been experiencing weakness over the last few weeks but since last week it has increased further and patient has had at least 2 falls. Denies having hit his head or loss consciousness. Denies having any incontinence of urine or bowels. Patient states he is able to move his  lower extremities but is unable to raise it above. Denies any tingling numbness. 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 MRI of the T-spine and L-spine were done which shows kinking at T4-5 level and also spinal stenosis at L4-5 level. Neurosurgeon on call Dr. Ellene Route was consulted and felt patient's weakness could not be from the kinking at T4-5. CT head was negative pending acute. Patient is able to move his upper extremities without any difficulty and has had no difficulty speaking swallowing or any visual problems. Patient denied any chest pain or shortness of breath.     Rhabdomyolysis with bilateral lower extremity weakness. CK peaked at around 20,000 today down to 11,000 with fluids, hematuria has resolved, his weakness has remarkably improved and is eager to go home. Have discontinued his statin which could have been the cause of his rhabdomyolysis, have ordered aldolase-LDH-ANA-anti-Jo 1 antibody, have requested him to follow with local rheumatologist Dr. Amil Amen in a week. His CK levels are still high outpatient muscle biopsy can be considered by general surgery who is seen the patient here. However he is clinically much improved. Home PT has been ordered. He has been requested to keep himself well-hydrated.     Transaminitis secondary to rhabdomyolysis, liver functions have normalized.   Polycythemia  due to dehydration. Resolved with hydration.   Hx of CAD  Asymptomatic at present, no acute issues, continue beta blocker and aspirin.    COPD  Well compensated, no acute issues no wheezing or shortness of breath.    OSA on CPAP  Continue home CPAP regimen every night.    Equivocal UA  UA not normal but also not strongly convincing of UTI - stopped antibiotics on 10/06/2013 as urine culture negative.    HTN  Control not ideal - we'll add low-dose Norvasc for better control.    HLD  Diet  management only for now as discussed above       Today   Subjective:   Bradley French today has no headache,no chest abdominal pain,no new weakness tingling or numbness, feels much better wants to go home today. Lower extremity weakness remarkably improved.   Objective:   Blood pressure 140/77, pulse 58, temperature 98.6 F (37 C), temperature source Oral, resp. rate  20, height 6\' 4"  (1.93 m), weight 105.6 kg (232 lb 12.9 oz), SpO2 98.00%.   Intake/Output Summary (Last 24 hours) at 10/07/13 0842 Last data filed at 10/07/13 0600  Gross per 24 hour  Intake   3030 ml  Output   1350 ml  Net   1680 ml    Exam Awake Alert, Oriented *3, No new F.N deficits, Normal affect Dresser.AT,PERRAL Supple Neck,No JVD, No cervical lymphadenopathy appriciated.  Symmetrical Chest wall movement, Good air movement bilaterally, CTAB RRR,No Gallops,Rubs or new Murmurs, No Parasternal Heave +ve B.Sounds, Abd Soft, Non tender, No organomegaly appriciated, No rebound -guarding or rigidity. No Cyanosis, Clubbing or edema, No new Rash or bruise  Data Review   CBC w Diff: Lab Results  Component Value Date   WBC 5.9 10/05/2013   HGB 13.6 10/05/2013   HCT 41.1 10/05/2013   PLT 194 10/05/2013   LYMPHOPCT 37 06/20/2012   MONOPCT 10 06/20/2012   EOSPCT 1 06/20/2012   BASOPCT 0 06/20/2012    CMP: Lab Results  Component Value Date   NA 143 10/07/2013   K 3.8 10/07/2013   CL 108 10/07/2013   CO2 24 10/07/2013   BUN 10 10/07/2013   CREATININE 0.55 10/07/2013   PROT 5.1* 10/05/2013   ALBUMIN 2.3* 10/05/2013   BILITOT 0.6 10/05/2013   ALKPHOS 44 10/05/2013   AST 255* 10/05/2013   ALT 141* 10/05/2013  .   Total Time in preparing paper work, data evaluation and todays exam - 35 minutes  Thurnell Lose M.D on 10/07/2013 at 8:42 AM  [facility] Office  [phone]

## 2013-10-07 NOTE — Discharge Instructions (Signed)
Follow with Primary MD Pcp Not In System in 7 days, keep your self well-hydrated  Get CBC, CMP, CK checked 7 days by Primary MD and again as instructed by your Primary MD.    Activity: As tolerated with Full fall precautions use walker/cane & assistance as needed   Disposition Home     Diet: Heart Healthy    For Heart failure patients - Check your Weight same time everyday, if you gain over 2 pounds, or you develop in leg swelling, experience more shortness of breath or chest pain, call your Primary MD immediately. Follow Cardiac Low Salt Diet and 1.8 lit/day fluid restriction.   On your next visit with her primary care physician please Get Medicines reviewed and adjusted.  Please request your Prim.MD to go over all Hospital Tests and Procedure/Radiological results at the follow up, please get all Hospital records sent to your Prim MD by signing hospital release before you go home.   If you experience worsening of your admission symptoms, develop shortness of breath, life threatening emergency, suicidal or homicidal thoughts you must seek medical attention immediately by calling 911 or calling your MD immediately  if symptoms less severe.  You Must read complete instructions/literature along with all the possible adverse reactions/side effects for all the Medicines you take and that have been prescribed to you. Take any new Medicines after you have completely understood and accpet all the possible adverse reactions/side effects.   Do not drive and provide baby sitting services if your were admitted for syncope or siezures until you have seen by Primary MD or a Neurologist and advised to do so again.  Do not drive when taking Pain medications.    Do not take more than prescribed Pain, Sleep and Anxiety Medications  Special Instructions: If you have smoked or chewed Tobacco  in the last 2 yrs please stop smoking, stop any regular Alcohol  and or any Recreational drug use.  Wear Seat  belts while driving.   Please note  You were cared for by a hospitalist during your hospital stay. If you have any questions about your discharge medications or the care you received while you were in the hospital after you are discharged, you can call the unit and asked to speak with the hospitalist on call if the hospitalist that took care of you is not available. Once you are discharged, your primary care physician will handle any further medical issues. Please note that NO REFILLS for any discharge medications will be authorized once you are discharged, as it is imperative that you return to your primary care physician (or establish a relationship with a primary care physician if you do not have one) for your aftercare needs so that they can reassess your need for medications and monitor your lab values.

## 2013-10-07 NOTE — Progress Notes (Signed)
Agree with above, dont think he needs biopsy, if needed can send as outpatient to office

## 2013-10-07 NOTE — Progress Notes (Signed)
  Subjective: CK trending down.  Pt denies weakness, headaches.  Wants to go home.   Objective: Vital signs in last 24 hours: Temp:  [97.2 F (36.2 C)-98.6 F (37 C)] 98.6 F (37 C) (05/09 0530) Pulse Rate:  [58-68] 58 (05/09 0530) Resp:  [14-20] 20 (05/09 0530) BP: (140-162)/(73-77) 140/77 mmHg (05/09 0530) SpO2:  [98 %-99 %] 98 % (05/09 0530) Last BM Date: 10/05/13  Intake/Output from previous day: 05/08 0701 - 05/09 0700 In: 3390 [P.O.:960; I.V.:2430] Out: 1350 [Urine:1350] Intake/Output this shift:   PE General appearance: alert, cooperative and no distress Extremities: extremities normal, atraumatic, no cyanosis or edema  Lab Results:   Recent Labs  10/05/13 0430  WBC 5.9  HGB 13.6  HCT 41.1  PLT 194   BMET  Recent Labs  10/06/13 0500 10/07/13 0440  NA 142 143  K 3.8 3.8  CL 106 108  CO2 25 24  GLUCOSE 112* 113*  BUN 8 10  CREATININE 0.60 0.55  CALCIUM 7.7* 8.2*   PT/INR No results found for this basename: LABPROT, INR,  in the last 72 hours ABG No results found for this basename: PHART, PCO2, PO2, HCO3,  in the last 72 hours  Studies/Results: No results found.  Anti-infectives: Anti-infectives   Start     Dose/Rate Route Frequency Ordered Stop   10/04/13 1800  cefTRIAXone (ROCEPHIN) 1 g in dextrose 5 % 50 mL IVPB     1 g 100 mL/hr over 30 Minutes Intravenous Every 24 hours 10/03/13 2334 10/05/13 1912   10/03/13 1630  cefTRIAXone (ROCEPHIN) 1 g in dextrose 5 % 50 mL IVPB     1 g 100 mL/hr over 30 Minutes Intravenous  Once 10/03/13 1624 10/03/13 1732      Assessment/Plan: Hx CAD s/p CABG 2006  OSA  COPD  HTN  HLD   Bilateral lower extremity weakness  Rhabdomyolysis  Transaminitis The patient wishes to go home. May follow up outpatient if a muscle biopsy is still desired.      LOS: 4 days    Jama Krichbaum ANP-BC 10/07/2013 10:27 AM

## 2013-10-07 NOTE — Progress Notes (Signed)
Physical Therapy Treatment Patient Details Name: Bradley French MRN: [id number] DOB: [birth year] Today's Date: 2013/11/06    History of Present Illness 73 year old with weakness in bilateral lower extremities which per MD note developed a little over week ago. Per MD, about 3 weeks ago he had a flulike syndrome involving a GI bug. Pt has had 2 falls since onset of weakness. MRI demonstratesSevere spinal stenosis at L4-5 due to increased size of a disc.    PT Comments    Pt very pleasant & willing to participate in therapy.  Session cont'd to focus on incorporating higher level balance activities while ambulating today.    Follow Up Recommendations  Outpatient PT;Supervision/Assistance - 24 hour     Equipment Recommendations  None recommended by PT    Recommendations for Other Services       Precautions / Restrictions Precautions Precautions: Fall Precaution Comments: 2 falls in past week Restrictions Weight Bearing Restrictions: No    Mobility  Bed Mobility                  Transfers Overall transfer level: Modified independent Equipment used: Straight cane Transfers: Sit to/from Stand           General transfer comment: Performed with safe technique.  Incr time to stand from low surface but no physical assist needed  Ambulation/Gait Ambulation/Gait assistance: Supervision Ambulation Distance (Feet): 400 Feet Assistive device: Straight cane Gait Pattern/deviations: Step-through pattern Gait velocity: Supervision for safety.  Pt able to perform higher level balance activities without LOB.         Stairs            Wheelchair Mobility    Modified Rankin (Stroke Patients Only)       Balance                             High level balance activites: Side stepping;Backward walking;Direction changes;Turns;Sudden stops;Head turns (picking object up off floor)      Cognition Arousal/Alertness: Awake/alert Behavior During Therapy:  WFL for tasks assessed/performed Overall Cognitive Status: Within Functional Limits for tasks assessed                      Exercises      General Comments        Pertinent Vitals/Pain No pain reported.     Home Living                      Prior Function            PT Goals (current goals can now be found in the care plan section) Acute Rehab PT Goals PT Goal Formulation: With patient Time For Goal Achievement: 10/11/13 Potential to Achieve Goals: Good Progress towards PT goals: Progressing toward goals    Frequency  Min 4X/week    PT Plan Current plan remains appropriate    Co-evaluation             End of Session   Activity Tolerance: Patient tolerated treatment well Patient left: in chair;with call bell/phone within reach     Time: 1134-1144 PT Time Calculation (min): 10 min  Charges:  $Gait Training: 8-22 mins                    G Codes:      Sena Hitch 2013/11/06, 12:27 PM  Sarajane Marek, PTA [phone] 2013-11-06

## 2013-10-09 LAB — JO-1 ANTIBODY-IGG: Jo-1 Antibody, IgG: <1

## 2013-10-09 LAB — ANA: Anti Nuclear Antibody(ANA): NEGATIVE

## 2013-10-09 NOTE — Progress Notes (Addendum)
CARE MANAGEMENT NOTE 10/09/2013  Patient:  Bradley French, Bradley French   Account Number:  [id number]  Date Initiated:  10/04/2013  Documentation initiated by:  Marvetta Gibbons  Subjective/Objective Assessment:   Pt admitted with dizziness, elevated cks, lumbar stenosis. progressive lower extremity weakness.     Action/Plan:   PTA pt lved at home with spouse- NCM to follow for d/c needs   Anticipated DC Date:  10/06/2013   Anticipated DC Plan:  [place]  CM consult      Choice offered to / List presented to:     DME arranged  WALKER - ROLLING  3-N-1  TUB BENCH  OTHER - SEE COMMENT        HH arranged  [place] agency  [place]   Status of service:  Completed, signed off Medicare Important Message given?   (If response is "NO", the following Medicare IM given date fields will be blank) Date Medicare IM given:   Date Additional Medicare IM given:    Discharge Disposition:  [place]  Per UR Regulation:  Reviewed for med. necessity/level of care/duration of stay  If discussed at Long Length of Stay Meetings, dates discussed:    Comments:  Erenest Rasher, RN Case Manager Signed CASE MANAGEMENT Progress Notes Service date: 10/09/2013 9:00 AM NCM spoke to pt and states he has his insurance with [place]. Pt agreeable to [facility] with [facility]. Contacted [facility] for [facility]. She will call NCM back if they cannot accept referral. Bradley Finner RN CCM Case Mgmt phone [phone]    10/06/13 [place] RN, MSN, CM- Per [facility], patient does not follow with them.  CM contacted [facility], who states patient does follow with them.  CM was transferred to [phone] and [phone] for assistance. Voicemails were left at both extensions, but not return calls recieved.  CM updated patient, who will now be having a biopsy and will not discharge until Monday.  CM will continue attempts to  reach the [place].  Patient's wife has declined a [place] aide, as she will be home with him 24/7.  10/06/13 G. L. Garcia RN, MSN, CM- Met with patient to discuss home health needs for discharge home tomorrow. Patient is agreeable to home health and would like the recommended DME items.  Patient follows at the [facility]. Voicemail was left for Vaughan Basta  [phone] ext [phone] requesting assistance with discharge planning.  Awaiting return call.

## 2013-10-10 LAB — ALDOLASE: Aldolase: 100.2 U/L — ABNORMAL HIGH (ref <=8.1)

## 2015-07-23 ENCOUNTER — Other Ambulatory Visit: Payer: Self-pay | Admitting: Orthopedic Surgery

## 2015-07-24 ENCOUNTER — Other Ambulatory Visit: Payer: Self-pay | Admitting: Orthopedic Surgery

## 2015-07-24 DIAGNOSIS — M1732 Unilateral post-traumatic osteoarthritis, left knee: Secondary | ICD-10-CM

## 2015-07-26 ENCOUNTER — Ambulatory Visit: Payer: Non-veteran care

## 2015-10-14 ENCOUNTER — Other Ambulatory Visit: Payer: Non-veteran care

## 2015-10-17 ENCOUNTER — Inpatient Hospital Stay: Admit: 2015-10-17 | Payer: Non-veteran care | Admitting: Orthopedic Surgery

## 2015-10-17 SURGERY — ARTHROPLASTY, KNEE, TOTAL
Anesthesia: Choice | Laterality: Left

## 2015-10-23 ENCOUNTER — Encounter
Admission: RE | Admit: 2015-10-23 | Discharge: 2015-10-23 | Disposition: A | Discharge status: Home / Self Care | Payer: No Typology Code available for payment source | Source: Ambulatory Visit | Attending: Orthopedic Surgery | Admitting: Orthopedic Surgery

## 2015-10-23 DIAGNOSIS — M1712 Unilateral primary osteoarthritis, left knee: Secondary | ICD-10-CM | POA: Diagnosis not present

## 2015-10-23 DIAGNOSIS — Z01812 Encounter for preprocedural laboratory examination: Secondary | ICD-10-CM | POA: Insufficient documentation

## 2015-10-23 HISTORY — DX: Type 2 diabetes mellitus without complications: E11.9

## 2015-10-23 HISTORY — DX: Anxiety disorder, unspecified: F41.9

## 2015-10-23 LAB — CBC
HCT: 48.8 % (ref 40.0–52.0)
HEMOGLOBIN: 16.2 g/dL (ref 13.0–18.0)
MCH: 29.6 pg (ref 26.0–34.0)
MCHC: 33.2 g/dL (ref 32.0–36.0)
MCV: 89.2 fL (ref 80.0–100.0)
Platelets: 291 10*3/uL (ref 150–440)
RBC: 5.47 MIL/uL (ref 4.40–5.90)
RDW: 14.1 % (ref 11.5–14.5)
WBC: 6.8 10*3/uL (ref 3.8–10.6)

## 2015-10-23 LAB — SURGICAL PCR SCREEN
MRSA, PCR: NEGATIVE
Staphylococcus aureus: NEGATIVE

## 2015-10-23 LAB — BASIC METABOLIC PANEL
Anion gap: 9 (ref 5–15)
BUN: 15 mg/dL (ref 6–20)
CHLORIDE: 106 mmol/L (ref 101–111)
CO2: 26 mmol/L (ref 22–32)
Calcium: 9.5 mg/dL (ref 8.9–10.3)
Creatinine, Ser: 0.8 mg/dL (ref 0.61–1.24)
GFR calc Af Amer: >60 mL/min (ref >60)
GFR calc non Af Amer: >60 mL/min (ref >60)
GLUCOSE: 116 mg/dL — AB (ref 65–99)
POTASSIUM: 3.6 mmol/L (ref 3.5–5.1)
Sodium: 141 mmol/L (ref 135–145)

## 2015-10-23 LAB — URINALYSIS COMPLETE WITH MICROSCOPIC (ARMC ONLY)
BILIRUBIN URINE: NEGATIVE
Bacteria, UA: NONE SEEN
GLUCOSE, UA: NEGATIVE mg/dL
Hgb urine dipstick: NEGATIVE
Ketones, ur: NEGATIVE mg/dL
Nitrite: NEGATIVE
PH: 5 (ref 5.0–8.0)
Protein, ur: NEGATIVE mg/dL
Specific Gravity, Urine: 1.015 (ref 1.005–1.030)

## 2015-10-23 LAB — PROTIME-INR
INR: 0.92
Prothrombin Time: 12.6 seconds (ref 11.4–15.0)

## 2015-10-23 LAB — TYPE AND SCREEN
ABO/RH(D): O POS
ANTIBODY SCREEN: NEGATIVE

## 2015-10-23 LAB — SEDIMENTATION RATE: SED RATE: 8 mm/h (ref 0–20)

## 2015-10-23 LAB — ABO/RH: ABO/RH(D): O POS

## 2015-10-23 LAB — APTT: aPTT: 30 seconds (ref 24–36)

## 2015-10-23 NOTE — Patient Instructions (Signed)
  Your procedure is scheduled on: November 05, 2015 (Tuesday) Report to Same Day Surgery 2nd floor Medical Mall To find out your arrival time please call [phone] between 1PM - 3PM on November 04, 2015 (Monday)  Remember: Instructions that are not followed completely may result in serious medical risk, up to and including death, or upon the discretion of your surgeon and anesthesiologist your surgery may need to be rescheduled.    _x___ 1. Do not eat food or drink liquids after midnight. No gum chewing or hard candies.     __x__ 2. No Alcohol for 24 hours before or after surgery.   ____ 3. Bring all medications with you on the day of surgery if instructed.    __x__ 4. Notify your doctor if there is any change in your medical condition     (cold, fever, infections).     Do not wear jewelry, make-up, hairpins, clips or nail polish.  Do not wear lotions, powders, or perfumes. You may wear deodorant.  Do not shave 48 hours prior to surgery. Men may shave face and neck.  Do not bring valuables to the hospital.    [facility] is not responsible for any belongings or valuables.               Contacts, dentures or bridgework may not be worn into surgery.  Leave your suitcase in the car. After surgery it may be brought to your room.  For patients admitted to the hospital, discharge time is determined by your treatment team.   Patients discharged the day of surgery will not be allowed to drive home.    Please read over the following fact sheets that you were given:   [facility] and or MRSA Information   _x___ Take these medicines the morning of surgery with A SIP OF WATER:    1. Amlodipine  2.Metoprolol  3.  4.  5.  6.  ____ Fleet Enema (as directed)   _x___ Use CHG Soap or sage wipes as directed on instruction sheet   ____ Use inhalers on the day of surgery and bring to hospital day of surgery  __x__ Stop metformin 2 days prior to surgery (STOP METFORMIN ON JUNE   4 )  ____ Take 1/2 of usual insulin dose the night before surgery and none on the morning of surgery          _x___ Stop aspirin or coumadin, or plavix  (ASK DR. MENZ ABOUT STOPPING ASPIRIN)  _x__ Stop Anti-inflammatories such as Advil, Aleve, Ibuprofen, Motrin, Naproxen,          Naprosyn, Goodies powders or aspirin products. Ok to take Tylenol.(STOP NAPROXEN ONE WEEK BEFORE SURGERY)   ____ Stop supplements until after surgery.    __x__ Bring C-Pap to the hospital.

## 2015-10-25 LAB — URINE CULTURE: Culture: >=100000 — AB

## 2015-10-25 NOTE — Pre-Procedure Instructions (Signed)
Urine culture results faxed to Dr. Rudene Christians office.

## 2015-11-05 ENCOUNTER — Inpatient Hospital Stay: Payer: No Typology Code available for payment source

## 2015-11-05 ENCOUNTER — Inpatient Hospital Stay
Admission: RE | Admit: 2015-11-05 | Discharge: 2015-11-08 | DRG: 470 | Disposition: A | Discharge status: Skilled Nursing Facility | Payer: No Typology Code available for payment source | Source: Ambulatory Visit | Attending: Orthopedic Surgery | Admitting: Orthopedic Surgery

## 2015-11-05 ENCOUNTER — Inpatient Hospital Stay: Payer: No Typology Code available for payment source | Admitting: Certified Registered Nurse Anesthetist

## 2015-11-05 ENCOUNTER — Encounter
Admission: RE | Disposition: A | Discharge status: Skilled Nursing Facility | Payer: Self-pay | Source: Ambulatory Visit | Attending: Orthopedic Surgery

## 2015-11-05 ENCOUNTER — Encounter: Payer: Self-pay | Admitting: Orthopedic Surgery

## 2015-11-05 DIAGNOSIS — F419 Anxiety disorder, unspecified: Secondary | ICD-10-CM | POA: Diagnosis present

## 2015-11-05 DIAGNOSIS — Z79899 Other long term (current) drug therapy: Secondary | ICD-10-CM

## 2015-11-05 DIAGNOSIS — K219 Gastro-esophageal reflux disease without esophagitis: Secondary | ICD-10-CM | POA: Diagnosis present

## 2015-11-05 DIAGNOSIS — Z888 Allergy status to other drugs, medicaments and biological substances status: Secondary | ICD-10-CM | POA: Diagnosis not present

## 2015-11-05 DIAGNOSIS — G8929 Other chronic pain: Secondary | ICD-10-CM | POA: Diagnosis present

## 2015-11-05 DIAGNOSIS — H269 Unspecified cataract: Secondary | ICD-10-CM | POA: Diagnosis present

## 2015-11-05 DIAGNOSIS — J449 Chronic obstructive pulmonary disease, unspecified: Secondary | ICD-10-CM | POA: Diagnosis present

## 2015-11-05 DIAGNOSIS — E119 Type 2 diabetes mellitus without complications: Secondary | ICD-10-CM | POA: Diagnosis present

## 2015-11-05 DIAGNOSIS — Z951 Presence of aortocoronary bypass graft: Secondary | ICD-10-CM | POA: Diagnosis not present

## 2015-11-05 DIAGNOSIS — Z7982 Long term (current) use of aspirin: Secondary | ICD-10-CM | POA: Diagnosis not present

## 2015-11-05 DIAGNOSIS — I2510 Atherosclerotic heart disease of native coronary artery without angina pectoris: Secondary | ICD-10-CM | POA: Diagnosis present

## 2015-11-05 DIAGNOSIS — M12569 Traumatic arthropathy, unspecified knee: Secondary | ICD-10-CM | POA: Diagnosis present

## 2015-11-05 DIAGNOSIS — F329 Major depressive disorder, single episode, unspecified: Secondary | ICD-10-CM | POA: Diagnosis present

## 2015-11-05 DIAGNOSIS — G8918 Other acute postprocedural pain: Secondary | ICD-10-CM

## 2015-11-05 DIAGNOSIS — G4733 Obstructive sleep apnea (adult) (pediatric): Secondary | ICD-10-CM | POA: Diagnosis present

## 2015-11-05 DIAGNOSIS — I10 Essential (primary) hypertension: Secondary | ICD-10-CM | POA: Diagnosis present

## 2015-11-05 DIAGNOSIS — E7800 Pure hypercholesterolemia, unspecified: Secondary | ICD-10-CM | POA: Diagnosis present

## 2015-11-05 DIAGNOSIS — M1712 Unilateral primary osteoarthritis, left knee: Secondary | ICD-10-CM | POA: Diagnosis present

## 2015-11-05 DIAGNOSIS — M1732 Unilateral post-traumatic osteoarthritis, left knee: Secondary | ICD-10-CM | POA: Diagnosis present

## 2015-11-05 HISTORY — PX: TOTAL KNEE ARTHROPLASTY: SHX125

## 2015-11-05 LAB — GLUCOSE, CAPILLARY
GLUCOSE-CAPILLARY: 132 mg/dL — AB (ref 65–99)
GLUCOSE-CAPILLARY: 186 mg/dL — AB (ref 65–99)
GLUCOSE-CAPILLARY: 189 mg/dL — AB (ref 65–99)
Glucose-Capillary: 179 mg/dL — ABNORMAL HIGH (ref 65–99)
Glucose-Capillary: 122 mg/dL — ABNORMAL HIGH (ref 65–99)

## 2015-11-05 LAB — CBC
HCT: 43.1 % (ref 40.0–52.0)
Hemoglobin: 14.7 g/dL (ref 13.0–18.0)
MCH: 29.9 pg (ref 26.0–34.0)
MCHC: 34.1 g/dL (ref 32.0–36.0)
MCV: 87.7 fL (ref 80.0–100.0)
PLATELETS: 208 10*3/uL (ref 150–440)
RBC: 4.91 MIL/uL (ref 4.40–5.90)
RDW: 14.2 % (ref 11.5–14.5)
WBC: 9 10*3/uL (ref 3.8–10.6)

## 2015-11-05 LAB — CREATININE, SERUM
Creatinine, Ser: 0.75 mg/dL (ref 0.61–1.24)
GFR calc Af Amer: >60 mL/min (ref >60)
GFR calc non Af Amer: >60 mL/min (ref >60)

## 2015-11-05 SURGERY — ARTHROPLASTY, KNEE, TOTAL
Anesthesia: General | Laterality: Left

## 2015-11-05 MED ORDER — PHENOL 1.4 % MT LIQD: 1.0000 | OROMUCOSAL | Status: DC | PRN

## 2015-11-05 MED ORDER — ACETAMINOPHEN 10 MG/ML IV SOLN
INTRAVENOUS | Status: AC
  Filled 2015-11-05: qty 100

## 2015-11-05 MED ORDER — BISACODYL 5 MG PO TBEC
5.0000 mg | DELAYED_RELEASE_TABLET | Freq: Every day | ORAL | Status: DC | PRN
  Administered 2015-11-07: 5 mg via ORAL
  Filled 2015-11-05: qty 1

## 2015-11-05 MED ORDER — FENTANYL CITRATE (PF) 100 MCG/2ML IJ SOLN
INTRAMUSCULAR | Status: DC | PRN
  Administered 2015-11-05 (×4): 50 ug via INTRAVENOUS

## 2015-11-05 MED ORDER — ALUM & MAG HYDROXIDE-SIMETH 200-200-20 MG/5ML PO SUSP: 30.0000 mL | ORAL | Status: DC | PRN

## 2015-11-05 MED ORDER — MAGNESIUM CITRATE PO SOLN: 1.0000 | Freq: Once | ORAL | Status: DC | PRN

## 2015-11-05 MED ORDER — NEOMYCIN-POLYMYXIN B GU 40-200000 IR SOLN
Status: DC | PRN
  Administered 2015-11-05: 16 mL

## 2015-11-05 MED ORDER — ZOLPIDEM TARTRATE 5 MG PO TABS: 5.0000 mg | ORAL_TABLET | Freq: Every evening | ORAL | Status: DC | PRN

## 2015-11-05 MED ORDER — METOPROLOL SUCCINATE ER 25 MG PO TB24
12.5000 mg | ORAL_TABLET | Freq: Two times a day (BID) | ORAL | Status: DC
  Administered 2015-11-05 – 2015-11-07 (×5): 12.5 mg via ORAL
  Filled 2015-11-05 (×8): qty 1

## 2015-11-05 MED ORDER — METHOCARBAMOL 1000 MG/10ML IJ SOLN
500.0000 mg | Freq: Four times a day (QID) | INTRAVENOUS | Status: DC | PRN
  Filled 2015-11-05: qty 5

## 2015-11-05 MED ORDER — ONDANSETRON HCL 4 MG PO TABS: 4.0000 mg | ORAL_TABLET | Freq: Four times a day (QID) | ORAL | Status: DC | PRN

## 2015-11-05 MED ORDER — POLYETHYLENE GLYCOL 3350 17 G PO PACK: 17.0000 g | PACK | Freq: Every day | ORAL | Status: DC | PRN

## 2015-11-05 MED ORDER — OXYCODONE HCL 5 MG PO TABS
5.0000 mg | ORAL_TABLET | ORAL | Status: DC | PRN
  Administered 2015-11-05: 5 mg via ORAL
  Administered 2015-11-05 – 2015-11-06 (×3): 10 mg via ORAL
  Administered 2015-11-06 – 2015-11-08 (×8): 5 mg via ORAL
  Administered 2015-11-08 (×2): 10 mg via ORAL
  Filled 2015-11-05: qty 1
  Filled 2015-11-05 (×2): qty 2
  Filled 2015-11-05 (×4): qty 1
  Filled 2015-11-05: qty 2
  Filled 2015-11-05 (×2): qty 1
  Filled 2015-11-05: qty 2
  Filled 2015-11-05: qty 1
  Filled 2015-11-05: qty 2
  Filled 2015-11-05: qty 1

## 2015-11-05 MED ORDER — HYDROCHLOROTHIAZIDE 12.5 MG PO CAPS
12.5000 mg | ORAL_CAPSULE | Freq: Every day | ORAL | Status: DC
Start: 2015-11-05 — End: 2015-11-08
  Administered 2015-11-05 – 2015-11-08 (×4): 12.5 mg via ORAL
  Filled 2015-11-05 (×4): qty 1

## 2015-11-05 MED ORDER — BUPIVACAINE HCL (PF) 0.5 % IJ SOLN
INTRAMUSCULAR | Status: AC
  Filled 2015-11-05: qty 30

## 2015-11-05 MED ORDER — SODIUM CHLORIDE 0.9 % IV SOLN
INTRAVENOUS | Status: DC
  Administered 2015-11-05: 17:00:00 via INTRAVENOUS

## 2015-11-05 MED ORDER — CEFAZOLIN SODIUM-DEXTROSE 2-4 GM/100ML-% IV SOLN
2.0000 g | Freq: Once | INTRAVENOUS | Status: AC
Start: 2015-11-05 — End: 2015-11-05
  Administered 2015-11-05: 2 g via INTRAVENOUS

## 2015-11-05 MED ORDER — MORPHINE SULFATE (PF) 10 MG/ML IV SOLN
INTRAVENOUS | Status: AC
Start: 2015-11-05 — End: 2015-11-05
  Filled 2015-11-05: qty 1

## 2015-11-05 MED ORDER — SODIUM CHLORIDE FLUSH 0.9 % IV SOLN
INTRAVENOUS | Status: AC
  Filled 2015-11-05: qty 10

## 2015-11-05 MED ORDER — SODIUM CHLORIDE 0.9 % IJ SOLN
INTRAMUSCULAR | Status: AC
  Filled 2015-11-05: qty 50

## 2015-11-05 MED ORDER — NICOTINE 14 MG/24HR TD PT24
14.0000 mg | MEDICATED_PATCH | Freq: Every day | TRANSDERMAL | Status: DC
  Administered 2015-11-05 – 2015-11-08 (×4): 14 mg via TRANSDERMAL
  Filled 2015-11-05 (×4): qty 1

## 2015-11-05 MED ORDER — FAMOTIDINE 20 MG PO TABS
ORAL_TABLET | ORAL | Status: AC
  Filled 2015-11-05: qty 1

## 2015-11-05 MED ORDER — BUPIVACAINE-EPINEPHRINE (PF) 0.25% -1:200000 IJ SOLN
INTRAMUSCULAR | Status: DC | PRN
  Administered 2015-11-05: 30 mL via PERINEURAL

## 2015-11-05 MED ORDER — SUGAMMADEX SODIUM 200 MG/2ML IV SOLN
INTRAVENOUS | Status: DC | PRN
  Administered 2015-11-05: 225 mg via INTRAVENOUS

## 2015-11-05 MED ORDER — AMLODIPINE BESYLATE 10 MG PO TABS
10.0000 mg | ORAL_TABLET | Freq: Every day | ORAL | Status: DC
  Administered 2015-11-06 – 2015-11-07 (×2): 10 mg via ORAL
  Filled 2015-11-05 (×3): qty 1

## 2015-11-05 MED ORDER — TRANEXAMIC ACID 1000 MG/10ML IV SOLN
INTRAVENOUS | Status: AC
  Filled 2015-11-05: qty 10

## 2015-11-05 MED ORDER — MORPHINE SULFATE (PF) 2 MG/ML IV SOLN
2.0000 mg | INTRAVENOUS | Status: DC | PRN
  Administered 2015-11-05: 2 mg via INTRAVENOUS
  Filled 2015-11-05: qty 1

## 2015-11-05 MED ORDER — BUPIVACAINE LIPOSOME 1.3 % IJ SUSP
INTRAMUSCULAR | Status: AC
  Filled 2015-11-05: qty 20

## 2015-11-05 MED ORDER — INSULIN ASPART 100 UNIT/ML ~~LOC~~ SOLN
0.0000 [IU] | Freq: Three times a day (TID) | SUBCUTANEOUS | Status: DC
  Administered 2015-11-05 (×2): 3 [IU] via SUBCUTANEOUS
  Administered 2015-11-06: 2 [IU] via SUBCUTANEOUS
  Administered 2015-11-06: 3 [IU] via SUBCUTANEOUS
  Administered 2015-11-06: 5 [IU] via SUBCUTANEOUS
  Administered 2015-11-07: 2 [IU] via SUBCUTANEOUS
  Administered 2015-11-07: 3 [IU] via SUBCUTANEOUS
  Administered 2015-11-08: 5 [IU] via SUBCUTANEOUS
  Filled 2015-11-05: qty 2
  Filled 2015-11-05: qty 5
  Filled 2015-11-05 (×4): qty 3
  Filled 2015-11-05: qty 2
  Filled 2015-11-05: qty 5

## 2015-11-05 MED ORDER — SUCCINYLCHOLINE 20MG/ML (10ML) SYRINGE FOR MEDFUSION PUMP - OPTIME
INTRAMUSCULAR | Status: DC | PRN
  Administered 2015-11-05: 120 mg via INTRAVENOUS

## 2015-11-05 MED ORDER — METOCLOPRAMIDE HCL 10 MG PO TABS: 5.0000 mg | ORAL_TABLET | Freq: Three times a day (TID) | ORAL | Status: DC | PRN

## 2015-11-05 MED ORDER — FENTANYL CITRATE (PF) 100 MCG/2ML IJ SOLN
25.0000 ug | INTRAMUSCULAR | Status: DC | PRN
  Administered 2015-11-05 (×3): 25 ug via INTRAVENOUS

## 2015-11-05 MED ORDER — KETAMINE HCL 50 MG/ML IJ SOLN
INTRAMUSCULAR | Status: DC | PRN
  Administered 2015-11-05: 50 mg via INTRAMUSCULAR

## 2015-11-05 MED ORDER — DOCUSATE SODIUM 100 MG PO CAPS
100.0000 mg | ORAL_CAPSULE | Freq: Two times a day (BID) | ORAL | Status: DC
  Administered 2015-11-05 – 2015-11-08 (×7): 100 mg via ORAL
  Filled 2015-11-05 (×7): qty 1

## 2015-11-05 MED ORDER — FENTANYL CITRATE (PF) 100 MCG/2ML IJ SOLN
INTRAMUSCULAR | Status: AC
  Administered 2015-11-05: 25 ug via INTRAVENOUS
  Filled 2015-11-05: qty 2

## 2015-11-05 MED ORDER — BUPIVACAINE-EPINEPHRINE (PF) 0.25% -1:200000 IJ SOLN
INTRAMUSCULAR | Status: AC
  Filled 2015-11-05: qty 30

## 2015-11-05 MED ORDER — METFORMIN HCL 500 MG PO TABS
500.0000 mg | ORAL_TABLET | Freq: Every day | ORAL | Status: DC
  Administered 2015-11-06 – 2015-11-08 (×3): 500 mg via ORAL
  Filled 2015-11-05 (×3): qty 1

## 2015-11-05 MED ORDER — ACETAMINOPHEN 10 MG/ML IV SOLN
INTRAVENOUS | Status: DC | PRN
  Administered 2015-11-05: 1000 mg via INTRAVENOUS

## 2015-11-05 MED ORDER — LIDOCAINE HCL (CARDIAC) 20 MG/ML IV SOLN
INTRAVENOUS | Status: DC | PRN
  Administered 2015-11-05: 80 mg via INTRAVENOUS

## 2015-11-05 MED ORDER — ASPIRIN EC 81 MG PO TBEC
81.0000 mg | DELAYED_RELEASE_TABLET | Freq: Every day | ORAL | Status: DC
  Administered 2015-11-05 – 2015-11-08 (×4): 81 mg via ORAL
  Filled 2015-11-05 (×4): qty 1

## 2015-11-05 MED ORDER — ONDANSETRON HCL 4 MG/2ML IJ SOLN
INTRAMUSCULAR | Status: DC | PRN
  Administered 2015-11-05: 4 mg via INTRAVENOUS

## 2015-11-05 MED ORDER — FAMOTIDINE 20 MG PO TABS
20.0000 mg | ORAL_TABLET | Freq: Once | ORAL | Status: AC
  Administered 2015-11-05: 20 mg via ORAL

## 2015-11-05 MED ORDER — DIPHENHYDRAMINE HCL 12.5 MG/5ML PO ELIX: 12.5000 mg | ORAL_SOLUTION | ORAL | Status: DC | PRN

## 2015-11-05 MED ORDER — MENTHOL 3 MG MT LOZG: 1.0000 | LOZENGE | OROMUCOSAL | Status: DC | PRN

## 2015-11-05 MED ORDER — SODIUM CHLORIDE 0.9 % IV SOLN
INTRAVENOUS | Status: DC
  Administered 2015-11-05: 07:00:00 via INTRAVENOUS

## 2015-11-05 MED ORDER — ROCURONIUM BROMIDE 100 MG/10ML IV SOLN
INTRAVENOUS | Status: DC | PRN
  Administered 2015-11-05: 5 mg via INTRAVENOUS
  Administered 2015-11-05: 15 mg via INTRAVENOUS

## 2015-11-05 MED ORDER — ONDANSETRON HCL 4 MG/2ML IJ SOLN: 4.0000 mg | Freq: Once | INTRAMUSCULAR | Status: DC | PRN

## 2015-11-05 MED ORDER — SODIUM CHLORIDE 0.9 % IJ SOLN
INTRAMUSCULAR | Status: DC | PRN
  Administered 2015-11-05: 40 mL

## 2015-11-05 MED ORDER — ACETAMINOPHEN 325 MG PO TABS
650.0000 mg | ORAL_TABLET | Freq: Four times a day (QID) | ORAL | Status: DC | PRN
  Administered 2015-11-06: 650 mg via ORAL
  Filled 2015-11-05: qty 2

## 2015-11-05 MED ORDER — ENOXAPARIN SODIUM 30 MG/0.3ML ~~LOC~~ SOLN
30.0000 mg | Freq: Two times a day (BID) | SUBCUTANEOUS | Status: DC
  Administered 2015-11-06 – 2015-11-08 (×5): 30 mg via SUBCUTANEOUS
  Filled 2015-11-05 (×5): qty 0.3

## 2015-11-05 MED ORDER — MIDAZOLAM HCL 5 MG/5ML IJ SOLN
INTRAMUSCULAR | Status: DC | PRN
  Administered 2015-11-05 (×2): 1 mg via INTRAVENOUS

## 2015-11-05 MED ORDER — MORPHINE SULFATE 10 MG/ML IJ SOLN
INTRAMUSCULAR | Status: DC | PRN
  Administered 2015-11-05: 10 mg via SUBCUTANEOUS

## 2015-11-05 MED ORDER — CEFAZOLIN SODIUM-DEXTROSE 2-4 GM/100ML-% IV SOLN
2.0000 g | Freq: Four times a day (QID) | INTRAVENOUS | Status: AC
Start: 2015-11-05 — End: 2015-11-06
  Administered 2015-11-05 – 2015-11-06 (×3): 2 g via INTRAVENOUS
  Filled 2015-11-05 (×3): qty 100

## 2015-11-05 MED ORDER — ATORVASTATIN CALCIUM 10 MG PO TABS
10.0000 mg | ORAL_TABLET | Freq: Every day | ORAL | Status: DC
  Administered 2015-11-05 – 2015-11-07 (×3): 10 mg via ORAL
  Filled 2015-11-05 (×3): qty 1

## 2015-11-05 MED ORDER — PHENYLEPHRINE HCL 10 MG/ML IJ SOLN
INTRAMUSCULAR | Status: DC | PRN
  Administered 2015-11-05: 100 ug via INTRAVENOUS

## 2015-11-05 MED ORDER — PROPOFOL 10 MG/ML IV BOLUS
INTRAVENOUS | Status: DC | PRN
  Administered 2015-11-05: 150 mg via INTRAVENOUS

## 2015-11-05 MED ORDER — CEFAZOLIN SODIUM-DEXTROSE 2-4 GM/100ML-% IV SOLN
INTRAVENOUS | Status: AC
  Filled 2015-11-05: qty 100

## 2015-11-05 MED ORDER — NEOMYCIN-POLYMYXIN B GU 40-200000 IR SOLN
Status: AC
  Filled 2015-11-05: qty 20

## 2015-11-05 MED ORDER — METOCLOPRAMIDE HCL 5 MG/ML IJ SOLN: 5.0000 mg | Freq: Three times a day (TID) | INTRAMUSCULAR | Status: DC | PRN

## 2015-11-05 MED ORDER — METHOCARBAMOL 500 MG PO TABS: 500.0000 mg | ORAL_TABLET | Freq: Four times a day (QID) | ORAL | Status: DC | PRN

## 2015-11-05 MED ORDER — TRANEXAMIC ACID 1000 MG/10ML IV SOLN
1000.0000 mg | INTRAVENOUS | Status: DC | PRN
  Administered 2015-11-05: 1000 mg via TOPICAL

## 2015-11-05 MED ORDER — ACETAMINOPHEN 650 MG RE SUPP: 650.0000 mg | Freq: Four times a day (QID) | RECTAL | Status: DC | PRN

## 2015-11-05 MED ORDER — ONDANSETRON HCL 4 MG/2ML IJ SOLN: 4.0000 mg | Freq: Four times a day (QID) | INTRAMUSCULAR | Status: DC | PRN

## 2015-11-05 SURGICAL SUPPLY — 66 items
BANDAGE ACE 6X5 VEL STRL LF (GAUZE/BANDAGES/DRESSINGS) ×1 IMPLANT
BLADE SAW 1 (BLADE) ×3 IMPLANT
BONE CEMENT GENTAMICIN (Cement) ×6 IMPLANT
CANISTER SUCT 1200ML W/VALVE (MISCELLANEOUS) ×3 IMPLANT
CANISTER SUCT 3000ML (MISCELLANEOUS) ×6 IMPLANT
CAPT KNEE TOTAL 3 ×2 IMPLANT
CATH FOL LEG HOLDER (MISCELLANEOUS) ×3 IMPLANT
CATH TRAY METER 16FR LF (MISCELLANEOUS) ×3 IMPLANT
CEMENT BONE GENTAMICIN 40 (Cement) IMPLANT
CHLORAPREP W/TINT 26ML (MISCELLANEOUS) ×6 IMPLANT
COOLER POLAR GLACIER W/PUMP (MISCELLANEOUS) ×3 IMPLANT
CUFF TOURN 24 STER (MISCELLANEOUS) IMPLANT
CUFF TOURN 30 STER DUAL PORT (MISCELLANEOUS) ×2 IMPLANT
DECANTER SPIKE VIAL GLASS SM (MISCELLANEOUS) ×2 IMPLANT
DRAPE INCISE IOBAN 66X45 STRL (DRAPES) ×2 IMPLANT
DRAPE SHEET LG 3/4 BI-LAMINATE (DRAPES) ×6 IMPLANT
DRSG MEPITEL 4X7.2 (GAUZE/BANDAGES/DRESSINGS) IMPLANT
ELECT CAUTERY BLADE 6.4 (BLADE) ×3 IMPLANT
ELECT REM PT RETURN 9FT ADLT (ELECTROSURGICAL) ×3
ELECTRODE REM PT RTRN 9FT ADLT (ELECTROSURGICAL) ×1 IMPLANT
GAUZE PETRO XEROFOAM 1X8 (MISCELLANEOUS) ×1 IMPLANT
GAUZE SPONGE 4X4 12PLY STRL (GAUZE/BANDAGES/DRESSINGS) ×1 IMPLANT
GLOVE BIO SURGEON STRL SZ7 (GLOVE) ×6 IMPLANT
GLOVE BIOGEL PI IND STRL 9 (GLOVE) ×1 IMPLANT
GLOVE BIOGEL PI INDICATOR 9 (GLOVE) ×2
GLOVE INDICATOR 7.5 STRL GRN (GLOVE) ×6 IMPLANT
GLOVE INDICATOR 8.0 STRL GRN (GLOVE) ×3 IMPLANT
GLOVE SURG ORTHO 8.0 STRL STRW (GLOVE) ×3 IMPLANT
GLOVE SURG ORTHO 9.0 STRL STRW (GLOVE) ×3 IMPLANT
GOWN STRL REUS W/ TWL LRG LVL3 (GOWN DISPOSABLE) ×1 IMPLANT
GOWN STRL REUS W/ TWL XL LVL3 (GOWN DISPOSABLE) ×1 IMPLANT
GOWN STRL REUS W/TWL LRG LVL3 (GOWN DISPOSABLE) ×9
GOWN STRL REUS W/TWL XL LVL3 (GOWN DISPOSABLE) ×3
GOWN SURG XXL (GOWNS) ×3 IMPLANT
HANDPIECE SUCTION TUBG SURGILV (MISCELLANEOUS) ×3 IMPLANT
HOOD PEEL AWAY FLYTE STAYCOOL (MISCELLANEOUS) ×6 IMPLANT
IMMBOLIZER KNEE 19 BLUE UNIV (SOFTGOODS) ×3 IMPLANT
KIT RM TURNOVER STRD PROC AR (KITS) ×3 IMPLANT
KNIFE SCULPS 14X20 (INSTRUMENTS) ×3 IMPLANT
NDL HYPO 25X1 1.5 SAFETY (NEEDLE) IMPLANT
NDL SAFETY 18GX1.5 (NEEDLE) ×3 IMPLANT
NDL SPNL 18GX3.5 QUINCKE PK (NEEDLE) ×1 IMPLANT
NDL SPNL 20GX3.5 QUINCKE YW (NEEDLE) ×1 IMPLANT
NEEDLE HYPO 25X1 1.5 SAFETY (NEEDLE) ×3 IMPLANT
NEEDLE SPNL 18GX3.5 QUINCKE PK (NEEDLE) ×3 IMPLANT
NEEDLE SPNL 20GX3.5 QUINCKE YW (NEEDLE) ×3 IMPLANT
NS IRRIG 1000ML POUR BTL (IV SOLUTION) ×3 IMPLANT
PACK TOTAL KNEE (MISCELLANEOUS) ×3 IMPLANT
PAD NEG PRESSURE SENSATRAC (MISCELLANEOUS) ×2 IMPLANT
PAD WRAPON POLAR KNEE (MISCELLANEOUS) ×1 IMPLANT
PREVENA PLUS ×2 IMPLANT
SOL .9 NS 3000ML IRR  AL (IV SOLUTION) ×2
SOL .9 NS 3000ML IRR AL (IV SOLUTION) ×1
SOL .9 NS 3000ML IRR UROMATIC (IV SOLUTION) ×1 IMPLANT
STAPLER SKIN PROX 35W (STAPLE) ×3 IMPLANT
SUCTION FRAZIER HANDLE 10FR (MISCELLANEOUS) ×2
SUCTION TUBE FRAZIER 10FR DISP (MISCELLANEOUS) ×1 IMPLANT
SUT DVC 2 QUILL PDO  T11 36X36 (SUTURE) ×2
SUT DVC 2 QUILL PDO T11 36X36 (SUTURE) ×1 IMPLANT
SUT DVC QUILL MONODERM 30X30 (SUTURE) ×3 IMPLANT
SYR 20CC LL (SYRINGE) ×3 IMPLANT
SYR 50ML LL SCALE MARK (SYRINGE) ×6 IMPLANT
SYRINGE 10CC LL (SYRINGE) ×2 IMPLANT
TOWEL OR 17X26 4PK STRL BLUE (TOWEL DISPOSABLE) ×3 IMPLANT
TOWER CARTRIDGE SMART MIX (DISPOSABLE) ×3 IMPLANT
WRAPON POLAR PAD KNEE (MISCELLANEOUS) ×3

## 2015-11-05 NOTE — Evaluation (Signed)
Physical Therapy Evaluation Patient Details Name: Bradley French MRN: [id number] DOB: 1941/04/04 Today's Date: 11/05/2015   History of Present Illness  Pt underwent L TKR under general anesthesia. No reported post-op complications. Pt is POD#0 at time of initial evaluation. 1 reported fall in the last 12 months  Clinical Impression  Pt demonstrates fair mobility for POD#0. He requires minA+1 for bed mobility but is CGA for transfers and limited ambulation from bed to recliner. Pt ambulates very slowly with decreased stance time on operative LLE. Gait is very antalgic. Pt is steady and able to safely make it to the recliner. He is able to complete all bed exercises as instructed but requiring assist for LLE SLR. KI donned for all mobility due to inability to perform SLR. Recommend HH PT at discharge as well as tall rolling walker. Pt will benefit from skilled PT services to address deficits in strength, balance, and mobility in order to return to full function at home.     Follow Up Recommendations Home health PT    Equipment Recommendations  Rolling walker with 5" wheels;Other (comment) (Needs a tall walker)    Recommendations for Other Services       Precautions / Restrictions Precautions Precautions: Fall;Knee Precaution Booklet Issued: Yes (comment) Restrictions Weight Bearing Restrictions: Yes LLE Weight Bearing: Weight bearing as tolerated      Mobility  Bed Mobility Overal bed mobility: Needs Assistance Bed Mobility: Supine to Sit     Supine to sit: Min assist     General bed mobility comments: Pt requires minA+1 to adduct LLE and for UE assist when exiting bed to the R. Pt is steady and stable once upright in sitting  Transfers Overall transfer level: Needs assistance Equipment used: Rolling walker (2 wheeled) Transfers: Sit to/from Stand Sit to Stand: Min assist         General transfer comment: Pt demonstrates decreased LE strength requiring extended time to  come to standing. KI donned for transfers and pt with decreased weight shift to LLE. Bed is elevated to assist pt due to height  Ambulation/Gait Ambulation/Gait assistance: Min guard Ambulation Distance (Feet): 3 Feet Assistive device: Rolling walker (2 wheeled) Gait Pattern/deviations: Step-to pattern;Decreased step length - right;Decreased stance time - left;Decreased weight shift to left Gait velocity: Decreased   General Gait Details: Pt able to take short steps to transfer from bed to recliner. Cues provided for proper sequencing with walker. Pt left on room air and SaO2 remains at or above 93% during entire treatment session  Stairs            Wheelchair Mobility    Modified Rankin (Stroke Patients Only)       Balance Overall balance assessment: Needs assistance Sitting-balance support: No upper extremity supported Sitting balance-Leahy Scale: Good     Standing balance support: Bilateral upper extremity supported Standing balance-Leahy Scale: Poor                               Pertinent Vitals/Pain Pain Assessment: 0-10 Pain Score: 6  Pain Location: L knee Pain Descriptors / Indicators: Aching Pain Intervention(s): Limited activity within patient's tolerance;Monitored during session    Home Living Family/patient expects to be discharged to:: Private residence Living Arrangements: Spouse/significant other;Children;Other (Comment) (Son) Available Help at Discharge: Family Type of Home: House Home Access: Stairs to enter Entrance Stairs-Rails: Left Entrance Stairs-Number of Steps: 4 Home Layout: Two level;Able to live on main level  with bedroom/bathroom Home Equipment: Cane - single point (no walker, BSC, or shower seat. No grab bars)      Prior Function Level of Independence: Independent with assistive device(s)         Comments: Independent full community ambulator with single point cane. Independent with ADLs/IADLs. Drives     Hand  Dominance   Dominant Hand: Right    Extremity/Trunk Assessment   Upper Extremity Assessment: Overall WFL for tasks assessed           Lower Extremity Assessment: LLE deficits/detail   LLE Deficits / Details: RLE strength appears grossly WFL. Pt has full L ankle DF/PF and reports full sensation to light touch. Pt requires assist for SLR and LAQ due to weakness. KI donned for all mobility     Communication   Communication: No difficulties  Cognition Arousal/Alertness: Lethargic Behavior During Therapy: WFL for tasks assessed/performed Overall Cognitive Status: Within Functional Limits for tasks assessed                      General Comments      Exercises Total Joint Exercises Ankle Circles/Pumps: Strengthening;Both;10 reps;Supine Quad Sets: Strengthening;Both;10 reps;Supine Gluteal Sets: Strengthening;Both;10 reps;Supine Towel Squeeze: Strengthening;Both;10 reps;Supine Hip ABduction/ADduction: Strengthening;10 reps;Supine;Left Straight Leg Raises: Strengthening;Left;10 reps;Supine Long Arc Quad: Strengthening;Left;10 reps;Seated Goniometric ROM: -5 to 78 degrees AAROM, pain limited      Assessment/Plan    PT Assessment Patient needs continued PT services  PT Diagnosis Difficulty walking;Abnormality of gait;Generalized weakness;Acute pain   PT Problem List Decreased strength;Decreased activity tolerance;Decreased balance;Decreased mobility;Decreased knowledge of use of DME;Pain  PT Treatment Interventions DME instruction;Gait training;Functional mobility training;Stair training;Therapeutic activities;Therapeutic exercise;Balance training;Neuromuscular re-education;Patient/family education;Manual techniques   PT Goals (Current goals can be found in the Care Plan section) Acute Rehab PT Goals Patient Stated Goal: Improve function at home with less pain PT Goal Formulation: With patient Time For Goal Achievement: 11/19/15 Potential to Achieve Goals: Good     Frequency BID   Barriers to discharge        Co-evaluation               End of Session Equipment Utilized During Treatment: Gait belt Activity Tolerance: Patient tolerated treatment well Patient left: in chair;with call bell/phone within reach;with chair alarm set;with nursing/sitter in room;with SCD's reapplied;Other (comment) (towel roll under heel, polar care in place) Nurse Communication: Mobility status         Time: 1550-1620 PT Time Calculation (min) (ACUTE ONLY): 30 min   Charges:   PT Evaluation $PT Eval Low Complexity: 1 Procedure PT Treatments $Therapeutic Exercise: 8-22 mins   PT G Codes:       Lyndel Safe Huprich PT, DPT   Huprich,Jason 11/05/2015, 4:37 PM

## 2015-11-05 NOTE — Progress Notes (Signed)
Patient was admitted from surgery via room bed and surgery, RN.  Patient is sleepy, but able to answer questions. C/O no pain. Foley draining clear yellow urine, NSL in left forearm in place, and Prevena wound vac to left knee intact, along with immobilizer and Polar cuff. On 2L O2, sats 96%. PPP. Ted to right leg and foot pumps in place. Bed alarm on for safety.

## 2015-11-05 NOTE — Care Management (Addendum)
Notified by [facility] "Bradley French" that they can assist with home health PT arrangements (choice: [facility], [facility], or [facility]). In [name]'s absence [name] [phone] fax [phone]. Patient is affiliated with [facility] (transfer coordinator) [phone] ext. [phone] (message left for [facility]) or ext. [phone].  [place] [phone] fax [phone]. Message left for patient's case manager at [facility] [phone]. Patient PCP is Dr. Windy Fast with [facility]. I will fax Rx to [place] as soon as I get copies. DME and Rx will need to come from [facility] per [place].

## 2015-11-05 NOTE — Care Management Note (Addendum)
Case Management Note  Patient Details  Name: Bradley French MRN: [id number] Date of Birth: 08/15/1940  Subjective/Objective:                   Met with patient, his wife, one son, and a daughter (there's another son not present). He wants to go home at discharge. He does not have any DME at home- he has a cane but doesn't use it. O2 is acute. He uses [facility] for [facility]. He is aware of the process and time it takes to get [facility] and [facility].  Action/Plan: List of home health agencies left with wife ([facility], [facility], or [facility]). Rolling walker needed. Lovenox needed per Chris PA.   Expected Discharge Date:                  Expected Discharge Plan:     In-House Referral:     Discharge planning Services  CM Consult  Post Acute Care Choice:  Home Health, Durable Medical Equipment Choice offered to:  Patient, Spouse, Adult Children  DME Arranged:    DME Agency:     HH Arranged:    HH Agency:     Status of Service:  In process, will continue to follow  Medicare Important Message Given:    Date Medicare IM Given:    Medicare IM give by:    Date Additional Medicare IM Given:    Additional Medicare Important Message give by:     If discussed at Long Length of Stay Meetings, dates discussed:    Additional Comments: Prescriptions received from Chris Gaines PA: Oxycodone 5mg #60 Take i to ii by mouth every 4-6 hours PRN pain and Lovenox 40mg injection daily for 14 days not refills. I have [facility]ed this information to [facility]- Att: Justin [phone] fax [phone].I have asked them to help with Rx and DME also since I have not had callback from [facility] case worker (hopeful I hear from them tomorrow).  [facility].  Prescriptions for both given to patient's wife and she will take prescriptions to [facility] and his PCP.   Angela Johnson, RN 11/05/2015, 2:13 PM  

## 2015-11-05 NOTE — Progress Notes (Signed)
Patient is A&O x4. LTK this am. Up to chair this afternoon.  Gave IV pain med x1 and oral pain med x1, with good relief. Blood sugars were 179 and 189, received SS insulin coverage. Tolerated diet. Foley draining clear yellow urine. Prevena wound vac in place along with Polar cuff and immobilizer. PPP.

## 2015-11-05 NOTE — Anesthesia Procedure Notes (Signed)
Procedure Name: Intubation Date/Time: 11/05/2015 7:43 AM Performed by: Johnna Acosta Pre-anesthesia Checklist: Patient identified, Emergency Drugs available, Suction available, Patient being monitored and Timeout performed Patient Re-evaluated:Patient Re-evaluated prior to inductionOxygen Delivery Method: Circle system utilized Preoxygenation: Pre-oxygenation with 100% oxygen Intubation Type: IV induction Ventilation: Mask ventilation without difficulty and Oral airway inserted - appropriate to patient size Laryngoscope Size: Miller and 2 Grade View: Grade II Tube type: Oral Tube size: 7.5 mm Number of attempts: 1 Airway Equipment and Method: Stylet Secured at: 24 cm Tube secured with: Tape Dental Injury: Teeth and Oropharynx as per pre-operative assessment  Difficulty Due To: Difficult Airway- due to large tongue and Difficult Airway- due to dentition Future Recommendations: Recommend- induction with short-acting agent, and alternative techniques readily available

## 2015-11-05 NOTE — Clinical Social Work Note (Signed)
CSW was consulted for New SNF. CSW received chart. PT is recommending HHPT. CSW updated RNCM, who will follow for discharge planning needs. CSW is signing off as no further needs identified. Please reconsult if a need arises prior to discharge.  Darden Dates, MSW, LCSW  Clinical Social Worker  [phone]

## 2015-11-05 NOTE — H&P (Signed)
Reviewed paper H+P, will be scanned into chart. No changes noted.  

## 2015-11-05 NOTE — Anesthesia Preprocedure Evaluation (Signed)
Anesthesia Evaluation  Patient identified by MRN, date of birth, ID band Patient awake    Reviewed: Allergy & Precautions, NPO status , Patient's Chart, lab work & pertinent test results  History of Anesthesia Complications Negative for: history of anesthetic complications  Airway Mallampati: II       Dental   Pulmonary asthma , sleep apnea (not using CPAP) , COPD,  COPD inhaler, Current Smoker,           Cardiovascular hypertension, Pt. on medications and Pt. on home beta blockers + CAD       Neuro/Psych Anxiety Depression negative neurological ROS     GI/Hepatic Neg liver ROS, PUD, GERD  Medicated,  Endo/Other  diabetes, Type 2, Oral Hypoglycemic Agents  Renal/GU negative Renal ROS     Musculoskeletal  (+) Arthritis ,   Abdominal   Peds  Hematology negative hematology ROS (+)   Anesthesia Other Findings   Reproductive/Obstetrics                             Anesthesia Physical Anesthesia Plan  ASA: III  Anesthesia Plan: Spinal   Post-op Pain Management:    Induction: Intravenous  Airway Management Planned:   Additional Equipment:   Intra-op Plan:   Post-operative Plan:   Informed Consent: I have reviewed the patients History and Physical, chart, labs and discussed the procedure including the risks, benefits and alternatives for the proposed anesthesia with the patient or authorized representative who has indicated his/her understanding and acceptance.     Plan Discussed with:   Anesthesia Plan Comments:         Anesthesia Quick Evaluation

## 2015-11-05 NOTE — Op Note (Signed)
11/05/2015  10:17 AM  PATIENT:  Bradley French  75 y.o. male  PRE-OPERATIVE DIAGNOSIS:  post - traumatic osteoarthritis of left knee  POST-OPERATIVE DIAGNOSIS:  post- traumatic osteoarthritis of left knee  PROCEDURE:  Procedure(s): TOTAL KNEE ARTHROPLASTY (Left)  SURGEON: Laurene Footman, MD  ASSISTANTS: [facility]  ANESTHESIA:   general  EBL:  Total I/O In: 1300 [I.V.:1300] Out: 300 [Urine:100; Blood:200]  BLOOD ADMINISTERED:none  DRAINS: Provine a wound VAC applied   LOCAL MEDICATIONS USED:  MARCAINE    and OTHER morphine and exparel  SPECIMEN:  Source of Specimen:  Cut ends of bone  DISPOSITION OF SPECIMEN:  PATHOLOGY  COUNTS:  YES  TOURNIQUET:   Total Tourniquet Time Documented: Thigh (Left) - 23 minutes Total: Thigh (Left) - 23 minutes   IMPLANTS: Medacta GMK sphere 4+ femur, 4 tibia with short stem, 12 mm insert with 3 patella, all components cemented  DICTATION: .Dragon Dictation patient was brought the operating room and after adequate general anesthesia was obtained after failed attempt getting a spinal, the left leg was prepped and draped in sterile fashion. After patient identification and timeout procedure completed a midline skin incision is incision was made going slightly lateral to normal because of an old medial incision from a MCL repair a medial patellar parapatellar arthrotomy is performed and inspection knee revealed eburnated bone in the medial compartment and patellofemoral joint with extensive osteophytes and moderate wear to the lateral compartment the anterior cruciate ligament and PCL were excised along with the fat pad. The proximal tibia is exposed and the extra medullary tibial alignment rod was used to perform the proximal tibia cut and after performing the distal femoral cut additional 2 millimeters was removed. The distal femoral cut was carried out using the intramedullary alignment guide after a distal femoral drill hole was made with  a 6 valgus cut. Distal drill holes were placed and the 4+ guide appeared appropriate based on the anterior referencing guide. The 4-in-1 cutting block was applied anterior posterior and chamfer cuts carried out. With excision of the posterior horns of the menisci trials were placed and the 11 and 12 both gave full extension and better stability with a 12 mm insert and this was chosen as the final implant the tibia insert trial was placed and first toe with keel preparation with for short stem and keel punch placed after the distal femoral component had been applied and it was deemed appropriate size components were chosen distal femoral drill holes were made and a notch cut made for the trochlear groove with the anterior chamfer bone cut being used as a bone graft into this the bone tunnel for the intramedullary rod. At this point the patella was exposed and cut after first measuring thickness after cutting drilling it sized to a size 3. At this point the chart knee was infiltrated with the local mentioned above. The tourniquet was raised at this point the bony surfaces thoroughly irrigated and dried. Components cemented into place with excess cement removed and the 12 mm insert with set screw tightened with the torque wrench. After the cement had set and there been  dilute Betadine irrigation, the knee was irrigated with pulse lavage. Stable to range of motion and slight lateral release with a tight lateral retinaculum noted. Tourniquet is let down prior to wound closure there is no excessive bleeding. The arthrotomy was repaired using 2 Ethibond along the medial retinaculum followed by the heavy Quill. TXA was then injected into  the joint to minimize postoperative bleeding. To a Quill used subcutaneously followed by staples. A Provena plus wound VAC applied  PLAN OF CARE: Admit to inpatient   PATIENT DISPOSITION:  PACU - hemodynamically stable.

## 2015-11-05 NOTE — Anesthesia Postprocedure Evaluation (Signed)
Anesthesia Post Note  Patient: Bradley French  Procedure(s) Performed: Procedure(s) (LRB): TOTAL KNEE ARTHROPLASTY (Left)  Patient location during evaluation: PACU Anesthesia Type: General Level of consciousness: awake and alert Pain management: pain level controlled Vital Signs Assessment: post-procedure vital signs reviewed and stable Respiratory status: spontaneous breathing and respiratory function stable Cardiovascular status: stable Anesthetic complications: no    Last Vitals:  Filed Vitals:   11/05/15 1231 11/05/15 1414  BP: 134/65 157/85  Pulse: 81 89  Temp: 36.9 C   Resp: 18 19    Last Pain:  Filed Vitals:   11/05/15 1414  PainSc: 7                  KEPHART,WILLIAM K

## 2015-11-05 NOTE — Transfer of Care (Signed)
Immediate Anesthesia Transfer of Care Note  Patient: Bradley French  Procedure(s) Performed: Procedure(s): TOTAL KNEE ARTHROPLASTY (Left)  Patient Location: PACU  Anesthesia Type:General  Level of Consciousness: sedated  Airway & Oxygen Therapy: Patient Spontanous Breathing and Patient connected to face mask oxygen  Post-op Assessment: Report given to RN and Post -op Vital signs reviewed and stable  Post vital signs: Reviewed and stable  Last Vitals:  Filed Vitals:   11/05/15 0618  BP: 136/66  Pulse: 76  Temp: 36.7 C  Resp: 18    Last Pain: There were no vitals filed for this visit.       Complications: No apparent anesthesia complications

## 2015-11-06 LAB — CBC
HCT: 40.2 % (ref 40.0–52.0)
Hemoglobin: 13.7 g/dL (ref 13.0–18.0)
MCH: 30.5 pg (ref 26.0–34.0)
MCHC: 34.1 g/dL (ref 32.0–36.0)
MCV: 89.3 fL (ref 80.0–100.0)
PLATELETS: 201 10*3/uL (ref 150–440)
RBC: 4.5 MIL/uL (ref 4.40–5.90)
RDW: 14.2 % (ref 11.5–14.5)
WBC: 10.7 10*3/uL — AB (ref 3.8–10.6)

## 2015-11-06 LAB — GLUCOSE, CAPILLARY
GLUCOSE-CAPILLARY: 143 mg/dL — AB (ref 65–99)
Glucose-Capillary: 235 mg/dL — ABNORMAL HIGH (ref 65–99)
Glucose-Capillary: 161 mg/dL — ABNORMAL HIGH (ref 65–99)
Glucose-Capillary: 102 mg/dL — ABNORMAL HIGH (ref 65–99)

## 2015-11-06 LAB — BASIC METABOLIC PANEL
ANION GAP: 6 (ref 5–15)
BUN: 7 mg/dL (ref 6–20)
CALCIUM: 8 mg/dL — AB (ref 8.9–10.3)
CO2: 27 mmol/L (ref 22–32)
Chloride: 103 mmol/L (ref 101–111)
Creatinine, Ser: 0.89 mg/dL (ref 0.61–1.24)
Glucose, Bld: 152 mg/dL — ABNORMAL HIGH (ref 65–99)
Potassium: 3.6 mmol/L (ref 3.5–5.1)
Sodium: 136 mmol/L (ref 135–145)

## 2015-11-06 MED ORDER — BISACODYL 10 MG RE SUPP
10.0000 mg | Freq: Every day | RECTAL | Status: DC | PRN
Start: 2015-11-06 — End: 2015-11-08

## 2015-11-06 NOTE — Progress Notes (Addendum)
   Subjective: 1 Day Post-Op Procedure(s) (LRB): TOTAL KNEE ARTHROPLASTY (Left) Patient reports pain as mild.   Patient is well, and has had no acute complaints or problems Denies any CP, SOB, ABD pain. We will continue therapy today.  Plan is to go Home after hospital stay.  Objective: Vital signs in last 24 hours: Temp:  [97.5 F (36.4 C)-100.2 F (37.9 C)] 100.2 F (37.9 C) (06/07 0733) Pulse Rate:  [78-89] 88 (06/07 0733) Resp:  [16-21] 16 (06/07 0420) BP: (120-157)/(62-85) 128/65 mmHg (06/07 0733) SpO2:  [95 %-100 %] 95 % (06/07 0733) Weight:  [113.399 kg (250 lb)] 113.399 kg (250 lb) (06/06 1209)  Intake/Output from previous day: 06/06 0701 - 06/07 0700 In: 1740 [P.O.:240; I.V.:1500] Out: 3370 [Urine:3170; Blood:200] Intake/Output this shift:     Recent Labs  11/05/15 1229 11/06/15 0436  HGB 14.7 13.7    Recent Labs  11/05/15 1229 11/06/15 0436  WBC 9.0 10.7*  RBC 4.91 4.50  HCT 43.1 40.2  PLT 208 201    Recent Labs  11/05/15 1229 11/06/15 0436  NA  --  136  K  --  3.6  CL  --  103  CO2  --  27  BUN  --  7  CREATININE 0.75 0.89  GLUCOSE  --  152*  CALCIUM  --  8.0*   No results for input(s): LABPT, INR in the last 72 hours.  EXAM General - Patient is Alert, Appropriate and Oriented Extremity - Neurovascular intact Sensation intact distally Intact pulses distally Dorsiflexion/Plantar flexion intact No cellulitis present Dressing - dressing C/D/I and wound vac intact, no drainage Motor Function - intact, moving foot and toes well on exam.   Past Medical History  Diagnosis Date  . Hypertension   . Hypercholesteremia   . Colon polyps ???    none on 2006 colonoscopy  . Hemorrhoid   . Diverticulosis of colon   . Gastric ulcer 2006    ? secondary to H Pylori, vs ASA.   Marland Kitchen H pylori ulcer 2006    treated with Prevpac  . Coronary artery disease   . Mental disorder   . COPD (chronic obstructive pulmonary disease) ([place])   . OSA on CPAP      diagnosed at [place] in [place]  . Cataract   . Depression     PHQ-9 positive at [facility] - score of 4-6  . GERD (gastroesophageal reflux disease)   . OA (osteoarthritis) of knee     left knee: tricompartmental deterioration. s/p steroid injections  . Benign hematuria June '08    CT abdomen negative; cystoscopy - negative (at [facility])  . Diabetes mellitus without complication ([place])   . Anxiety     Assessment/Plan:   1 Day Post-Op Procedure(s) (LRB): TOTAL KNEE ARTHROPLASTY (Left) Active Problems:   Traumatic arthritis of knee  Estimated body mass index is 30.04 kg/(m^2) as calculated from the following:   Height as of this encounter: 6' 4.5" (1.943 m).   Weight as of this encounter: 113.399 kg (250 lb). Advance diet Up with therapy  Recheck labs in the am Encouraged incentive spirometer Bone foam applied   DVT Prophylaxis - Lovenox, Foot Pumps and TED hose Weight-Bearing as tolerated to right leg D/C O2 and Pulse OX and try on Room Air  T. Rachelle Hora, PA-C [place] 11/06/2015, 7:59 AM

## 2015-11-06 NOTE — Progress Notes (Signed)
Physical Therapy Treatment Patient Details Name: Bradley French MRN: [id number] DOB: 1941-01-24 Today's Date: 11/06/2015    History of Present Illness Pt underwent L TKR under general anesthesia. No reported post-op complications. Pt is POD#0 at time of initial evaluation. 1 reported fall in the last 12 months    PT Comments    Pt agreeable to PT; reports 8/10 pain in left knee. Continues to requires assist with bed mobility and transfers. Poor quad set/control on left requiring knee immobilizer for transfers/ambulation. Ambulation remain limited to chair due to antalgia and difficulty controlling extension in left knee with stance phase. Increased attention this morning to left knee extension stretching, as initial range was lacking 13 degrees of extension; improved to 3 degrees with stretch. Pt up in chair. Plan to see pt this afternoon. Progress needed in ambulation and stairs to allow return home.  Follow Up Recommendations  Home health PT (pending ambulation/stair progress)     Equipment Recommendations  Rolling walker with 5" wheels;Other (comment) (tall)    Recommendations for Other Services       Precautions / Restrictions Precautions Precautions: Fall;Knee Restrictions Weight Bearing Restrictions: Yes LLE Weight Bearing: Weight bearing as tolerated    Mobility  Bed Mobility Overal bed mobility: Needs Assistance Bed Mobility: Supine to Sit     Supine to sit: Min assist     General bed mobility comments: Min A for LEs  Transfers Overall transfer level: Needs assistance Equipment used: Rolling walker (2 wheeled) Transfers: Sit to/from Stand Sit to Stand: From elevated surface;Min assist         General transfer comment: For steadiness  Ambulation/Gait Ambulation/Gait assistance: Min guard Ambulation Distance (Feet): 4 Feet Assistive device: Rolling walker (2 wheeled) Gait Pattern/deviations: Step-to pattern;Antalgic (poor quad set left) Gait velocity:  Decreased Gait velocity interpretation: <1.8 ft/sec, indicative of risk for recurrent falls General Gait Details: Requires VCs for quad set on left with R step.    Stairs            Wheelchair Mobility    Modified Rankin (Stroke Patients Only)       Balance Overall balance assessment: Needs assistance Sitting-balance support: No upper extremity supported Sitting balance-Leahy Scale: Good     Standing balance support: Bilateral upper extremity supported Standing balance-Leahy Scale: Fair (fair minus)                      Cognition Arousal/Alertness: Awake/alert Behavior During Therapy: WFL for tasks assessed/performed Overall Cognitive Status: Within Functional Limits for tasks assessed                      Exercises Total Joint Exercises Ankle Circles/Pumps: AROM;Both;20 reps (long sit) Quad Sets: Strengthening;Both;20 reps;Supine (again seated with gravity assist stretch into extension) Straight Leg Raises: AAROM;Strengthening;5 reps;Supine Knee Flexion: AAROM;Left;10 reps;Seated (3 positions each rep for stretch with 10 sec hold each) Goniometric ROM: 3 to 88    General Comments        Pertinent Vitals/Pain Pain Assessment: 0-10 Pain Score: 8  Pain Location: L knee Pain Descriptors / Indicators: Constant;Sharp;Tightness Pain Intervention(s): Limited activity within patient's tolerance;Monitored during session;Premedicated before session;Repositioned;Ice applied    Home Living                      Prior Function            PT Goals (current goals can now be found in the care plan section) Progress  towards PT goals: Progressing toward goals (slowly)    Frequency  BID    PT Plan Current plan remains appropriate (as long as ambulation improves and stairs accomplished)    Co-evaluation             End of Session Equipment Utilized During Treatment: Gait belt Activity Tolerance: Patient tolerated treatment well;Other  (comment) (L knee painful) Patient left: in chair;with call bell/phone within reach;with chair alarm set;with SCD's reapplied;Other (comment) (polar care in place)     Time: [id number] PT Time Calculation (min) (ACUTE ONLY): 31 min  Charges:  $Gait Training: 8-22 mins $Therapeutic Exercise: 8-22 mins                    G Codes:      Charlaine Dalton, PTA 11/06/2015, 10:47 AM

## 2015-11-06 NOTE — Evaluation (Signed)
Occupational Therapy Evaluation Patient Details Name: Bradley French MRN: [id number] DOB: Oct 21, 1940 Today's Date: 11/06/2015    History of Present Illness Pt  is a 75 y.o. male who underwent L TKR under general anesthesia.    Clinical Impression   Pt. Is a 75 y.o. male who was admitted for a Left TKR. Pt presents with limited ROM, Pain, weakness, and impaired functional mobility which hinder his ability to complete ADL and IADL tasks. Pt. could benefit from skilled OT services to review A/E use for LE ADLs, to review necessary home modifications, and to improve functional mobility for ADL/IADLs in order to work towards regaining Independence with ADL/IADLs. *Pt. Currently has to wear knee immobilizer when up.* Pt.  Currently has 8/10 pain at rest.   Follow Up Recommendations  Home health OT    Equipment Recommendations       Recommendations for Other Services PT consult     Precautions / Restrictions Precautions Precautions: Fall;Knee Precaution Booklet Issued: Yes (comment) Restrictions Weight Bearing Restrictions: Yes LLE Weight Bearing: Weight bearing as tolerated      Mobility Bed Mobility Transfers   Balance                            ADL Overall ADL's : Needs assistance/impaired Eating/Feeding: Set up   Grooming: Set up               Lower Body Dressing: Moderate assistance               Functional mobility during ADLs: Moderate assistance (8/10 pain is limiting.)       Vision     Perception     Praxis      Pertinent Vitals/Pain Pain Assessment: 0-10 Pain Score: 8  Pain Location: Left knee Pain Descriptors / Indicators: Constant;Sharp;Tightness Pain Intervention(s): Limited activity within patient's tolerance;Monitored during session;Premedicated before session;Repositioned;Ice applied     Hand Dominance Right   Extremity/Trunk Assessment             Communication Communication Communication: No difficulties    Cognition Arousal/Alertness: Awake/alert Behavior During Therapy: WFL for tasks assessed/performed Overall Cognitive Status: Within Functional Limits for tasks assessed                     General Comments       Exercises Exercises: Total Joint     Shoulder Instructions      Home Living Family/patient expects to be discharged to:: Private residence Living Arrangements: Spouse/significant other Available Help at Discharge: Family Type of Home: House Home Access: Stairs to enter [employer] of Steps: 4 Entrance Stairs-Rails: Left Home Layout: Two level;Able to live on main level with bedroom/bathroom     Bathroom Shower/Tub: Tub/shower unit Shower/tub characteristics: Door Bathroom Toilet: Handicapped height Bathroom Accessibility: Yes   Home Equipment: Cane - single point          Prior Functioning/Environment Level of Independence: Independent with assistive device(s)             OT Diagnosis: Generalized weakness   OT Problem List: Decreased strength;Decreased range of motion;Decreased activity tolerance;Pain;Decreased knowledge of use of DME or AE;Decreased knowledge of precautions   OT Treatment/Interventions: Self-care/ADL training;Therapeutic exercise;Therapeutic activities;DME and/or AE instruction    OT Goals(Current goals can be found in the care plan section)    OT Frequency: Min 2X/week   Barriers to D/C:  Co-evaluation              End of Session    Activity Tolerance: Patient tolerated treatment well Patient left: in chair;with chair alarm set;with call bell/phone within reach   Time: 1055-1115 OT Time Calculation (min): 20 min Charges:  OT General Charges $OT Visit: 1 Procedure OT Evaluation $OT Eval Moderate Complexity: 1 Procedure G-Codes:    Harrel Carina, MS, OTR/L Harrel Carina 11/06/2015, 11:40 AM

## 2015-11-06 NOTE — Care Management (Addendum)
Patient said family was able to fill his discharge meds at his [place] PCP office yesterday--confirmed with Broadus John. Rolling walker Rx faxed to [facility] prosthetic department per Joseph's request at [facility] choice [phone]. Per Broadus John another authorization form will need to be completed for [facility] for auth through [facility]. This RNCM will fax completed form back to [facility] when I receive it from [place]- copy will be place on patient's physical chart. Form received and sent to [facility] for completion.

## 2015-11-06 NOTE — Progress Notes (Signed)
Patient foley removed, he voided 150 ml soon after.

## 2015-11-06 NOTE — Progress Notes (Signed)
Physical Therapy Treatment Patient Details Name: Bradley French MRN: [id number] DOB: July 15, 1940 Today's Date: 11/06/2015    History of Present Illness Pt underwent L TKR under general anesthesia. No reported post-op complications. Pt is POD#0 at time of initial evaluation. 1 reported fall in the last 12 months    PT Comments    Pt continuing to have high pain in left knee. Tolerated up in chair all morning, but ready to return to bed post session. Pt continues to have weak quad strength requiring immobilizer on left for transfers and ambulation. Improved ambulation distance with heavy instructions for sequence and left quad set with stance phase. Gait effortful and antalgic. Discussed talking with nursing for possible changes and/or additions in medication for improved pain control. Spouse present during session and education provided regarding expectations post surgery, exercises expected at this time and benefits of further rehabilitation post hospital stay. Continue PT to progress strength and range of motion to all for improved transfers, ambulation and all functional mobility. Social work notified of change in discharge planning.   Follow Up Recommendations  SNF     Equipment Recommendations  Rolling walker with 5" wheels;Other (comment) (doesn't require tall rw; regular can be set to proper height)    Recommendations for Other Services       Precautions / Restrictions Precautions Precautions: Fall;Knee Precaution Booklet Issued: Yes (comment) Restrictions Weight Bearing Restrictions: Yes LLE Weight Bearing: Weight bearing as tolerated    Mobility  Bed Mobility Overal bed mobility: Needs Assistance Bed Mobility: Sit to Supine       Sit to supine: Mod assist   General bed mobility comments: for LEs; pt with rather uncontrolled trunk deceleration  Transfers Overall transfer level: Needs assistance Equipment used: Rolling walker (2 wheeled) Transfers: Sit to/from  Stand Sit to Stand: From elevated surface;Min assist         General transfer comment: For steadiness  Ambulation/Gait Ambulation/Gait assistance: Min guard Ambulation Distance (Feet): 16 Feet Assistive device: Rolling walker (2 wheeled) Gait Pattern/deviations: Decreased step length - right;Decreased step length - left;Decreased stride length;Decreased dorsiflexion - left;Antalgic (poor quad control ) Gait velocity: Decreased Gait velocity interpretation: <1.8 ft/sec, indicative of risk for recurrent falls General Gait Details: Rw lowered 2 notches for improved fit and ability to weight bear through upper extremities. Heavy cueing for sequence and L quad set with stance phase   Stairs            Wheelchair Mobility    Modified Rankin (Stroke Patients Only)       Balance Overall balance assessment: Needs assistance Sitting-balance support: No upper extremity supported;Feet supported Sitting balance-Leahy Scale: Good     Standing balance support: Bilateral upper extremity supported Standing balance-Leahy Scale: Fair (fair minus)                      Cognition Arousal/Alertness: Awake/alert Behavior During Therapy: WFL for tasks assessed/performed Overall Cognitive Status: Within Functional Limits for tasks assessed                      Exercises Total Joint Exercises Ankle Circles/Pumps: AROM;Both;20 reps;Supine Quad Sets: Strengthening;20 reps;Left;Standing Straight Leg Raises: AAROM;Strengthening;5 reps;Supine Long Arc Quad: AAROM;Left;15 reps;Seated Knee Flexion: AAROM;Left;10 reps;Seated (3 positions each rep for stretch with 10 sec hold each)    General Comments        Pertinent Vitals/Pain Pain Assessment: 0-10 Pain Score: 8  Pain Location: L knee Pain Descriptors / Indicators: Constant;Aching;Tightness  Pain Intervention(s): Monitored during session;Premedicated before session;Repositioned;Ice applied    Home Living  Family/patient expects to be discharged to:: Private residence Living Arrangements: Spouse/significant other Available Help at Discharge: Family Type of Home: House Home Access: Stairs to enter Entrance Stairs-Rails: Left Home Layout: Two level;Able to live on main level with bedroom/bathroom Home Equipment: Kasandra Knudsen - single point      Prior Function Level of Independence: Independent with assistive device(s)          PT Goals (current goals can now be found in the care plan section) Progress towards PT goals: Progressing toward goals (slowly)    Frequency  BID    PT Plan Discharge plan needs to be updated (as long as ambulation improves and stairs accomplished)    Co-evaluation             End of Session Equipment Utilized During Treatment: Gait belt Activity Tolerance: Patient tolerated treatment well;Other (comment) (L knee painful) Patient left: with call bell/phone within reach;with SCD's reapplied;Other (comment);in bed;with bed alarm set;with family/visitor present (polar care in place)     Time: [id number] PT Time Calculation (min) (ACUTE ONLY): 33 min  Charges:  $Gait Training: 8-22 mins $Therapeutic Exercise: 8-22 mins                    G Codes:      Charlaine Dalton, PTA 11/06/2015, 3:11 PM

## 2015-11-06 NOTE — Progress Notes (Signed)
Patient is A&O x4, up with assist x1 and [employer].  Prevena wound vac to left knee, dressing intact. Low grade temps this shift, IS started, up to 2500 and one dose of Tylenol given with good relief. Blood sugars 143, 235, and 161, gave SS insulin coverage. Urinating without difficultly. No BM this shift.

## 2015-11-07 LAB — BASIC METABOLIC PANEL
Anion gap: 9 (ref 5–15)
BUN: 9 mg/dL (ref 6–20)
CALCIUM: 8.2 mg/dL — AB (ref 8.9–10.3)
CO2: 25 mmol/L (ref 22–32)
CREATININE: 0.77 mg/dL (ref 0.61–1.24)
Chloride: 101 mmol/L (ref 101–111)
GFR calc Af Amer: >60 mL/min (ref >60)
GFR calc non Af Amer: >60 mL/min (ref >60)
GLUCOSE: 131 mg/dL — AB (ref 65–99)
Potassium: 3.5 mmol/L (ref 3.5–5.1)
SODIUM: 135 mmol/L (ref 135–145)

## 2015-11-07 LAB — CBC
HCT: 39.5 % — ABNORMAL LOW (ref 40.0–52.0)
HEMOGLOBIN: 13.4 g/dL (ref 13.0–18.0)
MCH: 29.6 pg (ref 26.0–34.0)
MCHC: 34 g/dL (ref 32.0–36.0)
MCV: 86.8 fL (ref 80.0–100.0)
Platelets: 200 10*3/uL (ref 150–440)
RBC: 4.55 MIL/uL (ref 4.40–5.90)
RDW: 14 % (ref 11.5–14.5)
WBC: 11 10*3/uL — ABNORMAL HIGH (ref 3.8–10.6)

## 2015-11-07 LAB — SURGICAL PATHOLOGY

## 2015-11-07 LAB — GLUCOSE, CAPILLARY
Glucose-Capillary: 164 mg/dL — ABNORMAL HIGH (ref 65–99)
Glucose-Capillary: 149 mg/dL — ABNORMAL HIGH (ref 65–99)
Glucose-Capillary: 111 mg/dL — ABNORMAL HIGH (ref 65–99)
Glucose-Capillary: 123 mg/dL — ABNORMAL HIGH (ref 65–99)

## 2015-11-07 NOTE — Care Management (Addendum)
Message sent to [facility] with [facility] to check status of DME delivery for this patient. Home Health PT and discharge meds have been taken care of- only barrier now is DME. Received call from Donaciano Eva from prosthesis center at [facility]. She plans to deliver DME to this room today. PT still working with patient to get patient to home. Patient instructed to ask for pain medication to be able to work with PT as he believes that is his barrier to going home.

## 2015-11-07 NOTE — Progress Notes (Signed)
Physical Therapy Treatment Patient Details Name: Bradley French MRN: [id number] DOB: Oct 08, 1940 Today's Date: 11/07/2015    History of Present Illness Pt underwent L TKR under general anesthesia. No reported post-op complications. Pt is POD#0 at time of initial evaluation. 1 reported fall in the last 12 months    PT Comments    Pt sleeping upon arrival; able to awaken, but continues very lethargic with difficulty keeping eyes open. Pt does report 7/10 pain in left knee. Spouse notes pt received pain medication earlier and became very sleepy. Pt noted to have Left lower extremity in gray elevation block; however, Left lower extremity externally rotated enough to allow left knee flexion. Education provided to pt/spouse on importance of maintain left knee extension position and importance on extension for normal gait. Rolled towel placed under lateral side of elevation block to rotate lower extremity into neutral. Pt was able to participate in quad set strengthening with hold for gravity assisted stretch with mild manual stretch provided. Initial left knee extension at beginning of session (-)10 degrees with improvement to (-)3 degrees by session's end. Continue PT to progress range, strength, endurance and all functional mobility.   Follow Up Recommendations  SNF     Equipment Recommendations  Rolling walker with 5" wheels;Other (comment)    Recommendations for Other Services       Precautions / Restrictions Precautions Precautions: Fall;Knee Restrictions Weight Bearing Restrictions: Yes LLE Weight Bearing: Weight bearing as tolerated    Mobility  Bed Mobility Overal bed mobility: Needs Assistance Bed Mobility: Supine to Sit     Supine to sit: Min assist (for LEs)     General bed mobility comments: Pt extremely lethargid; does not wish out of bed, Safest at this time due to lethargy  Transfers Overall transfer level: Needs assistance Equipment used: Rolling walker (2  wheeled) Transfers: Sit to/from Stand Sit to Stand: Min assist         General transfer comment: Bed adjusted subjectively to level of pt's bed at home. Effortul and antalgic requiring Min A and increased time to rise; significant increased time to sit (46 seconds)  Ambulation/Gait Ambulation/Gait assistance: Min guard Ambulation Distance (Feet): 23 Feet Assistive device: Rolling walker (2 wheeled) Gait Pattern/deviations: Step-to pattern;Antalgic;Trunk flexed;Decreased step length - right;Decreased step length - left;Decreased stance time - left Gait velocity: Decreased Gait velocity interpretation: <1.8 ft/sec, indicative of risk for recurrent falls General Gait Details: No active ambulation this afternoon; education on importance of knee extension for normal gait    Stairs            Wheelchair Mobility    Modified Rankin (Stroke Patients Only)       Balance Overall balance assessment: Needs assistance         Standing balance support: Bilateral upper extremity supported Standing balance-Leahy Scale: Fair                      Cognition Arousal/Alertness: Lethargic Behavior During Therapy: WFL for tasks assessed/performed Overall Cognitive Status: Within Functional Limits for tasks assessed                      Exercises Total Joint Exercises Ankle Circles/Pumps: AROM;Both;20 reps;Supine Quad Sets: Strengthening;Both;20 reps;Supine (with mild passive stretch applied) Hip ABduction/ADduction: AROM;Strengthening;Left;10 reps;Standing Straight Leg Raises: AROM;Strengthening;Left;10 reps;Standing Long Arc Quad: AAROM;Left;Seated;20 reps Knee Flexion: AAROM;Left;10 reps;Seated (sets of 3 with 10 sec hold each) Goniometric ROM: 9 to 83 Marching in Standing: Left;AROM;10 reps;Standing  General Comments        Pertinent Vitals/Pain Pain Assessment: 0-10 Pain Score: 7  Pain Location: L knee Pain Descriptors / Indicators: Constant Pain  Intervention(s): Limited activity within patient's tolerance;Repositioned;Ice applied    Home Living                      Prior Function            PT Goals (current goals can now be found in the care plan section) Progress towards PT goals: Progressing toward goals (slowly)    Frequency  BID    PT Plan Current plan remains appropriate    Co-evaluation             End of Session Equipment Utilized During Treatment: Gait belt Activity Tolerance: Patient limited by lethargy Patient left: in bed;in CPM;with bed alarm set;with family/visitor present;with SCD's reapplied;Other (comment) (polar care in place)     Time: [id number] PT Time Calculation (min) (ACUTE ONLY): 15 min  Charges:  $Gait Training: 8-22 mins $Therapeutic Exercise: 8-22 mins $Therapeutic Activity: 8-22 mins                    G Codes:      Charlaine Dalton, PTA 11/07/2015, 4:08 PM

## 2015-11-07 NOTE — Progress Notes (Addendum)
   Subjective: 2 Days Post-Op Procedure(s) (LRB): TOTAL KNEE ARTHROPLASTY (Left) Patient reports pain as mild.   Patient is well, and has had no acute complaints or problems Denies any CP, SOB, ABD pain. We will continue therapy today.  Plan is to go Skilled nursing facility after hospital stay.  Objective: Vital signs in last 24 hours: Temp:  [98.3 F (36.8 C)-101.2 F (38.4 C)] 99.2 F (37.3 C) (06/08 0759) Pulse Rate:  [84-90] 84 (06/08 0759) Resp:  [16-20] 20 (06/08 0759) BP: (120-148)/(59-65) 120/59 mmHg (06/08 0759) SpO2:  [93 %-100 %] 96 % (06/08 0759)  Intake/Output from previous day: 06/07 0701 - 06/08 0700 In: 1280 [P.O.:1280] Out: 2125 [Urine:2125] Intake/Output this shift: Total I/O In: -  Out: 250 [Urine:250]   Recent Labs  11/05/15 1229 11/06/15 0436 11/07/15 0503  HGB 14.7 13.7 13.4    Recent Labs  11/06/15 0436 11/07/15 0503  WBC 10.7* 11.0*  RBC 4.50 4.55  HCT 40.2 39.5*  PLT 201 200    Recent Labs  11/06/15 0436 11/07/15 0503  NA 136 135  K 3.6 3.5  CL 103 101  CO2 27 25  BUN 7 9  CREATININE 0.89 0.77  GLUCOSE 152* 131*  CALCIUM 8.0* 8.2*   No results for input(s): LABPT, INR in the last 72 hours.  EXAM General - Patient is Alert, Appropriate and Oriented Extremity - Neurovascular intact Sensation intact distally Intact pulses distally Dorsiflexion/Plantar flexion intact No cellulitis present Dressing - dressing C/D/I and wound vac intact, no drainage Motor Function - intact, moving foot and toes well on exam.   Past Medical History  Diagnosis Date  . Hypertension   . Hypercholesteremia   . Colon polyps ???    none on 2006 colonoscopy  . Hemorrhoid   . Diverticulosis of colon   . Gastric ulcer 2006    ? secondary to H Pylori, vs ASA.   Marland Kitchen H pylori ulcer 2006    treated with Prevpac  . Coronary artery disease   . Mental disorder   . COPD (chronic obstructive pulmonary disease) ([place])   . OSA on CPAP     diagnosed  at [place] in [place]  . Cataract   . Depression     PHQ-9 positive at [facility] - score of 4-6  . GERD (gastroesophageal reflux disease)   . OA (osteoarthritis) of knee     left knee: tricompartmental deterioration. s/p steroid injections  . Benign hematuria June '08    CT abdomen negative; cystoscopy - negative (at [facility])  . Diabetes mellitus without complication ([place])   . Anxiety     Assessment/Plan:   2 Days Post-Op Procedure(s) (LRB): TOTAL KNEE ARTHROPLASTY (Left) Active Problems:   Traumatic arthritis of knee  Estimated body mass index is 30.04 kg/(m^2) as calculated from the following:   Height as of this encounter: 6' 4.5" (1.943 m).   Weight as of this encounter: 113.399 kg (250 lb). Advance diet Up with therapy  Encouraged incentive spirometer. Monitor fever. Will check UA/CXR if fever today Plan on discharge to SNF tomorrow Needs BM   DVT Prophylaxis - Lovenox, Foot Pumps and TED hose Weight-Bearing as tolerated to right leg D/C O2 and Pulse OX and try on Room Air  T. Rachelle Hora, PA-C [place] 11/07/2015, 8:12 AM

## 2015-11-07 NOTE — NC FL2 (Signed)
[place] LEVEL OF CARE SCREENING TOOL     IDENTIFICATION  Patient Name: Bradley French Birthdate: 07-24-1940 Sex: male Admission Date (Current Location): 11/05/2015  [place] and [place] Number:  Engineering geologist and Address:  [facility], [address], [place], [place] [address]      Provider Number: [id number]  Attending Physician Name and Address:  Hessie Knows, MD  Relative Name and Phone Number:       Current Level of Care: Hospital Recommended Level of Care: [place] Prior Approval Number:    Date Approved/Denied:   PASRR Number: [id number]  Discharge Plan: SNF    Current Diagnoses: Patient Active Problem List   Diagnosis Date Noted  . Traumatic arthritis of knee 11/05/2015  . Leg weakness 10/03/2013  . Lower extremity weakness 10/03/2013  . Abnormal MRI, spinal cord 10/03/2013  . OSA on CPAP   . Lower GI bleed 06/20/2012  . DYSLIPIDEMIA 08/06/2007  . TOBACCO ABUSE 08/06/2007  . CORONARY ARTERY DISEASE 08/06/2007  . COPD 08/06/2007  . DIVERTICULOSIS, COLON 08/06/2007  . GASTRIC ULCER, HX OF 08/06/2007  . HEMORRHOIDS, HX OF 08/06/2007  . CORONARY ARTERY BYPASS GRAFT, TWO VESSEL, HX OF 08/06/2007  . Other postprocedural status(V45.89) 08/06/2007    Orientation RESPIRATION BLADDER Height & Weight     Self, Time, Situation, Place  Normal Continent Weight: 250 lb (113.399 kg) Height:  6' 4.5" (194.3 cm)  BEHAVIORAL SYMPTOMS/MOOD NEUROLOGICAL BOWEL NUTRITION STATUS      Continent Diet (Carb Modified, Thin Liquids)  AMBULATORY STATUS COMMUNICATION OF NEEDS Skin   Limited Assist Verbally Normal                       Personal Care Assistance Level of Assistance  Bathing, Feeding, Dressing Bathing Assistance: Limited assistance Feeding assistance: Independent Dressing Assistance: Limited assistance     Functional Limitations Info  Sight, Hearing, Speech Sight Info: Adequate Hearing  Info: Adequate Speech Info: Adequate    SPECIAL CARE FACTORS FREQUENCY  PT (By licensed PT)     PT Frequency: 5              Contractures      Additional Factors Info  Code Status, Allergies Code Status Info: Full Code Allergies Info: Sulfonamide Derivatives           Current Medications (11/07/2015):  This is the current hospital active medication list Current Facility-Administered Medications  Medication Dose Route Frequency Provider Last Rate Last Dose  . 0.9 %  sodium chloride infusion   Intravenous Continuous Hessie Knows, MD   Stopped at 11/06/15 0900  . acetaminophen (TYLENOL) tablet 650 mg  650 mg Oral Q6H PRN Hessie Knows, MD   650 mg at 11/06/15 1452   Or  . acetaminophen (TYLENOL) suppository 650 mg  650 mg Rectal Q6H PRN Hessie Knows, MD      . alum & mag hydroxide-simeth (MAALOX/MYLANTA) 200-200-20 MG/5ML suspension 30 mL  30 mL Oral Q4H PRN Hessie Knows, MD      . amLODipine (NORVASC) tablet 10 mg  10 mg Oral Daily Hessie Knows, MD   10 mg at 11/07/15 [id number]  . aspirin EC tablet 81 mg  81 mg Oral Daily Hessie Knows, MD   81 mg at 11/07/15 [id number]  . atorvastatin (LIPITOR) tablet 10 mg  10 mg Oral QHS Hessie Knows, MD   10 mg at 11/06/15 2108  . bisacodyl (DULCOLAX) EC tablet 5 mg  5  mg Oral Daily PRN Hessie Knows, MD   5 mg at 11/07/15 0518  . bisacodyl (DULCOLAX) suppository 10 mg  10 mg Rectal Daily PRN Duanne Guess, PA-C      . diphenhydrAMINE (BENADRYL) 12.5 MG/5ML elixir 12.5-25 mg  12.5-25 mg Oral Q4H PRN Hessie Knows, MD      . docusate sodium (COLACE) capsule 100 mg  100 mg Oral BID Hessie Knows, MD   100 mg at 11/07/15 0831  . enoxaparin (LOVENOX) injection 30 mg  30 mg Subcutaneous Q12H Hessie Knows, MD   30 mg at 11/07/15 0830  . hydrochlorothiazide (MICROZIDE) capsule 12.5 mg  12.5 mg Oral Daily Hessie Knows, MD   12.5 mg at 11/07/15 0831  . insulin aspart (novoLOG) injection 0-15 Units  0-15 Units Subcutaneous TID WC Hessie Knows, MD   3 Units at  11/07/15 0830  . magnesium citrate solution 1 Bottle  1 Bottle Oral Once PRN Hessie Knows, MD      . menthol-cetylpyridinium (CEPACOL) lozenge 3 mg  1 lozenge Oral PRN Hessie Knows, MD       Or  . phenol [facility]) mouth spray 1 spray  1 spray Mouth/Throat PRN Hessie Knows, MD      . metFORMIN (GLUCOPHAGE) tablet 500 mg  500 mg Oral Q breakfast Hessie Knows, MD   500 mg at 11/07/15 0831  . methocarbamol (ROBAXIN) tablet 500 mg  500 mg Oral Q6H PRN Hessie Knows, MD       Or  . methocarbamol (ROBAXIN) 500 mg in dextrose 5 % 50 mL IVPB  500 mg Intravenous Q6H PRN Hessie Knows, MD      . metoCLOPramide (REGLAN) tablet 5-10 mg  5-10 mg Oral Q8H PRN Hessie Knows, MD       Or  . metoCLOPramide (REGLAN) injection 5-10 mg  5-10 mg Intravenous Q8H PRN Hessie Knows, MD      . metoprolol succinate (TOPROL-XL) 24 hr tablet 12.5 mg  12.5 mg Oral BID Hessie Knows, MD   12.5 mg at 11/07/15 0831  . morphine 2 MG/ML injection 2 mg  2 mg Intravenous Q1H PRN Hessie Knows, MD   2 mg at 11/05/15 1302  . nicotine (NICODERM CQ - dosed in mg/24 hours) patch 14 mg  14 mg Transdermal Daily Hessie Knows, MD   14 mg at 11/07/15 [id number]  . ondansetron (ZOFRAN) tablet 4 mg  4 mg Oral Q6H PRN Hessie Knows, MD       Or  . ondansetron [facility]) injection 4 mg  4 mg Intravenous Q6H PRN Hessie Knows, MD      . oxyCODONE (Oxy IR/ROXICODONE) immediate release tablet 5-10 mg  5-10 mg Oral Q3H PRN Hessie Knows, MD   5 mg at 11/07/15 [id number]  . polyethylene glycol (MIRALAX / GLYCOLAX) packet 17 g  17 g Oral Daily PRN Hessie Knows, MD      . zolpidem [facility]) tablet 5 mg  5 mg Oral QHS PRN Hessie Knows, MD         Discharge Medications: Please see discharge summary for a list of discharge medications.  Relevant Imaging Results:  Relevant Lab Results:   Additional Information SSN:  [id number]  Darden Dates, LCSW

## 2015-11-07 NOTE — Progress Notes (Signed)
Physical Therapy Treatment Patient Details Name: Bradley French MRN: [id number] DOB: 01-28-1941 Today's Date: 11/07/2015    History of Present Illness Pt underwent L TKR under general anesthesia. No reported post-op complications. Pt is POD#0 at time of initial evaluation. 1 reported fall in the last 12 months    PT Comments    Pt continues to demonstrate difficulty with all areas of mobility requiring significant increased cueing/instruction and time. Pt continues to complains of significant pain in left knee especially with weight bearing. Pt ultimately performs tasks with minimal physical assist overall, but poor initiation/ability to move Left lower extremity for bed mobility and scooting to edge of bed. Ambulation unsafe, as pt takes many pauses and moves extremely slow with minimal left foot clearance and and range in knee. Poor tolerance of left weightbearing. Pt also demonstrates significant increased time for sit to/from stand; more so with stand to sit and requires safety cues for hands. Lengthy conversation with pt/spouse as well as with SW and CM regarding appropriate discharge disposition. Continue PT this afternoon progressing strength, endurance and all functional mobility.   Follow Up Recommendations  SNF     Equipment Recommendations  Rolling walker with 5" wheels;Other (comment)    Recommendations for Other Services       Precautions / Restrictions Precautions Precautions: Fall;Knee Restrictions Weight Bearing Restrictions: Yes LLE Weight Bearing: Weight bearing as tolerated    Mobility  Bed Mobility Overal bed mobility: Needs Assistance Bed Mobility: Supine to Sit     Supine to sit: Min assist (for LEs)     General bed mobility comments: Requires Min A for LEs, but will not/cannot perform any movement independently of LLE toward edge of bed. Significant increased time to scoot to edge of bed  Transfers Overall transfer level: Needs assistance Equipment  used: Rolling walker (2 wheeled) Transfers: Sit to/from Stand Sit to Stand: Min assist         General transfer comment: Bed adjusted subjectively to level of pt's bed at home. Effortul and antalgic requiring Min A and increased time to rise; significant increased time to sit (46 seconds)  Ambulation/Gait Ambulation/Gait assistance: Min guard Ambulation Distance (Feet): 23 Feet Assistive device: Rolling walker (2 wheeled) Gait Pattern/deviations: Step-to pattern;Antalgic;Trunk flexed;Decreased step length - right;Decreased step length - left;Decreased stance time - left Gait velocity: Decreased Gait velocity interpretation: <1.8 ft/sec, indicative of risk for recurrent falls General Gait Details: Continues to require significant increased time to ambulate (12 min for 23 ft) with many pauses. Effortful and antalgic   Stairs            Wheelchair Mobility    Modified Rankin (Stroke Patients Only)       Balance Overall balance assessment: Needs assistance         Standing balance support: Bilateral upper extremity supported Standing balance-Leahy Scale: Fair                      Cognition Arousal/Alertness: Awake/alert Behavior During Therapy: WFL for tasks assessed/performed Overall Cognitive Status: Within Functional Limits for tasks assessed                      Exercises Total Joint Exercises Ankle Circles/Pumps: AROM;Both;20 reps;Supine Quad Sets: Strengthening;20 reps;Left;Standing (again in chair long sit with towel roll under ankle) Hip ABduction/ADduction: AROM;Strengthening;Left;10 reps;Standing Straight Leg Raises: AROM;Strengthening;Left;10 reps;Standing Long Arc Quad: AAROM;Left;Seated;20 reps Knee Flexion: AAROM;Left;10 reps;Seated (sets of 3 with 10 sec hold each) Goniometric ROM:  9 to 76 Marching in Standing: Left;AROM;10 reps;Standing    General Comments        Pertinent Vitals/Pain Pain Assessment: 0-10 Pain Score: 6  (9  with ambulation and 10 post ambulation) Pain Location: L knee Pain Descriptors / Indicators: Constant Pain Intervention(s): Monitored during session;Repositioned;RN gave pain meds during session;Ice applied    Home Living                      Prior Function            PT Goals (current goals can now be found in the care plan section) Progress towards PT goals: Progressing toward goals (very slowly)    Frequency  BID    PT Plan Current plan remains appropriate    Co-evaluation             End of Session Equipment Utilized During Treatment: Gait belt Activity Tolerance: Patient limited by fatigue;Patient limited by pain Patient left: in chair;with call bell/phone within reach;with chair alarm set;with family/visitor present;with SCD's reapplied;Other (comment) (polar care in placed)     Time: 1122-1215 PT Time Calculation (min) (ACUTE ONLY): 53 min  Charges:  $Gait Training: 8-22 mins $Therapeutic Exercise: 23-37 mins $Therapeutic Activity: 8-22 mins                    G Codes:      Charlaine Dalton, PTA 11/07/2015, 2:34 PM

## 2015-11-08 LAB — GLUCOSE, CAPILLARY
GLUCOSE-CAPILLARY: 226 mg/dL — AB (ref 65–99)
GLUCOSE-CAPILLARY: 112 mg/dL — AB (ref 65–99)
Glucose-Capillary: 101 mg/dL — ABNORMAL HIGH (ref 65–99)

## 2015-11-08 MED ORDER — ENOXAPARIN SODIUM 40 MG/0.4ML ~~LOC~~ SOLN: 40.0000 mg | SUBCUTANEOUS | Status: DC

## 2015-11-08 MED ORDER — OXYCODONE HCL 5 MG PO TABS: 5.0000 mg | ORAL_TABLET | ORAL | Status: DC | PRN

## 2015-11-08 MED ORDER — ONDANSETRON HCL 4 MG PO TABS: 4.0000 mg | ORAL_TABLET | Freq: Four times a day (QID) | ORAL | Status: DC | PRN

## 2015-11-08 NOTE — Clinical Social Work Placement (Signed)
   CLINICAL SOCIAL WORK PLACEMENT  NOTE  Date:  11/08/2015  Patient Details  Name: Bradley French MRN: [id number] Date of Birth: February 14, 1941  Clinical Social Work is seeking post-discharge placement for this patient at the [place] level of care (*CSW will initial, date and re-position this form in  chart as items are completed):  Yes   Patient/family provided with [facility] Work Department's list of facilities offering this level of care within the geographic area requested by the patient (or if unable, by the patient's family).  Yes   Patient/family informed of their freedom to choose among providers that offer the needed level of care, that participate in Medicare, Medicaid or managed care program needed by the patient, have an available bed and are willing to accept the patient.  Yes   Patient/family informed of [facility]'s ownership interest in [facility] and [facility], as well as of the fact that they are under no obligation to receive care at these facilities.  PASRR submitted to EDS on 11/08/15     PASRR number received on 11/08/15     Existing PASRR number confirmed on       FL2 transmitted to all facilities in geographic area requested by pt/family on       FL2 transmitted to all facilities within larger geographic area on       Patient informed that his/her managed care company has contracts with or will negotiate with certain facilities, including the following:        Yes   Patient/family informed of bed offers received.  Patient chooses bed at  [facility])     Physician recommends and patient chooses bed at  [facility])    Patient to be transferred to  [facility] on 11/08/15.  Patient to be transferred to facility by  (EMS for knee precautions)     Patient family notified on 11/08/15 of transfer.  Name of family member notified:  Patient and wife     PHYSICIAN       Additional Comment:     _______________________________________________ Shela Leff, LCSW 11/08/2015, 4:01 PM

## 2015-11-08 NOTE — Clinical Social Work Note (Addendum)
MD to discharge patient today. Initially patient and wife chose [facility] however [facility] retracted their offer due to the fact that they did not have a bed long enough to accommodate patient's height of 6 feet 4 inches. CSW met with patient and wife and explained this. CSW also informed that [facility] in [place] was able to offer and they were pleased with this as it is in [employer] which they had also wanted. Patient and wife accepted the [facility] bed offer. Claiborne Billings at [facility] is aware and discharge information sent. Patient to transport via EMS. Patient and wife advised that patient's insurance should cover for EMS due to knee precautions however there is not a guarantee of this. Patient and wife verbalized understanding. Shela Leff MSW,LCSW [phone]

## 2015-11-08 NOTE — Care Management (Signed)
PT pending for today. Hopeful that patient will do better and return to home with home health. I have notified Fredderick Phenix with [facility] that status is pending for SNF disposition. Patient/wife agree with either plan. [facility] notified.

## 2015-11-08 NOTE — Progress Notes (Signed)
Physical Therapy Treatment Patient Details Name: Bradley French MRN: [id number] DOB: 06-12-1940 Today's Date: 11/08/2015    History of Present Illness Pt underwent L TKR under general anesthesia. No reported post-op complications. Pt is POD#0 at time of initial evaluation. 1 reported fall in the last 12 months    PT Comments    Agrees to session.  Bed mobility very slow and hesitant with small movements but overall requires only min assist.  Stood with min a x 1 and was able to amb to/from doorway with slow, cautious gait.  Pt self limits mobility stating "that's enough" and turns to return to chair.  Participated in exercises as described below.   Discharge to rehab is anticipated this afternoon.   Follow Up Recommendations  SNF     Equipment Recommendations  Rolling walker with 5" wheels;Other (comment)    Recommendations for Other Services       Precautions / Restrictions Precautions Precautions: Fall;Knee Restrictions Weight Bearing Restrictions: Yes LLE Weight Bearing: Weight bearing as tolerated    Mobility  Bed Mobility Overal bed mobility: Needs Assistance Bed Mobility: Supine to Sit     Supine to sit: Min assist        Transfers Overall transfer level: Needs assistance Equipment used: Rolling walker (2 wheeled) Transfers: Sit to/from Stand Sit to Stand: Min assist            Ambulation/Gait Ambulation/Gait assistance: Min guard Ambulation Distance (Feet): 25 Feet Assistive device: Rolling walker (2 wheeled) Gait Pattern/deviations: Step-to pattern Gait velocity: Decreased Gait velocity interpretation: <1.8 ft/sec, indicative of risk for recurrent falls General Gait Details: self limits gait distances, slow, cautions gait   Stairs            Wheelchair Mobility    Modified Rankin (Stroke Patients Only)       Balance Overall balance assessment: Needs assistance Sitting-balance support: No upper extremity supported Sitting  balance-Leahy Scale: Good     Standing balance support: Bilateral upper extremity supported Standing balance-Leahy Scale: Fair                      Cognition Arousal/Alertness: Awake/alert Behavior During Therapy: WFL for tasks assessed/performed Overall Cognitive Status: Within Functional Limits for tasks assessed                      Exercises Total Joint Exercises Ankle Circles/Pumps: AROM;Both;20 reps;Supine Quad Sets: Strengthening;Both;20 reps;Supine Gluteal Sets: Strengthening;Both;10 reps;Supine Straight Leg Raises: AROM;Left;5 reps (self terminated) Long Arc Quad: AAROM;Left;Seated;20 reps Knee Flexion: AAROM;Left;10 reps;Seated Goniometric ROM: 80    General Comments        Pertinent Vitals/Pain Pain Assessment: 0-10 Pain Score: 7  Pain Location: L knee Pain Descriptors / Indicators: Aching;Operative site guarding Pain Intervention(s): Limited activity within patient's tolerance;Ice applied    Home Living                      Prior Function            PT Goals (current goals can now be found in the care plan section) Progress towards PT goals: Progressing toward goals    Frequency  BID    PT Plan Current plan remains appropriate    Co-evaluation             End of Session Equipment Utilized During Treatment: Gait belt Activity Tolerance: Patient limited by pain Patient left: in chair;with call bell/phone within reach;with chair alarm set  Time: [id number] PT Time Calculation (min) (ACUTE ONLY): 26 min  Charges:  $Gait Training: 8-22 mins $Therapeutic Exercise: 8-22 mins                    G Codes:      Chesley Noon, PTA 11/08/2015, 11:38 AM

## 2015-11-08 NOTE — Progress Notes (Signed)
Report called to [facility]. EMS called. Waiting on EMS.

## 2015-11-08 NOTE — Plan of Care (Signed)
Problem: Activity: Goal: Will remain free from falls Outcome: Progressing No falls this shfit thus far

## 2015-11-08 NOTE — Care Management (Signed)
Discharge summary faxed to [facility] with [facility]. Patient to SNF today. No further RNCM needs.

## 2015-11-08 NOTE — Progress Notes (Signed)
   Subjective: 3 Days Post-Op Procedure(s) (LRB): TOTAL KNEE ARTHROPLASTY (Left) Patient reports pain as mild.   Patient is well, and has had no acute complaints or problems Denies any CP, SOB, ABD pain. We will continue therapy today.  Plan is to go Skilled nursing facility after hospital stay.  Objective: Vital signs in last 24 hours: Temp:  [98.6 F (37 C)-99.5 F (37.5 C)] 99.5 F (37.5 C) (06/09 0314) Pulse Rate:  [84-90] 84 (06/09 0314) Resp:  [18-20] 18 (06/09 0314) BP: (129-131)/(55-65) 129/55 mmHg (06/09 0314) SpO2:  [94 %-98 %] 94 % (06/09 0314)  Intake/Output from previous day: 06/08 0701 - 06/09 0700 In: 720 [P.O.:720] Out: 1400 [Urine:1400] Intake/Output this shift:     Recent Labs  11/05/15 1229 11/06/15 0436 11/07/15 0503  HGB 14.7 13.7 13.4    Recent Labs  11/06/15 0436 11/07/15 0503  WBC 10.7* 11.0*  RBC 4.50 4.55  HCT 40.2 39.5*  PLT 201 200    Recent Labs  11/06/15 0436 11/07/15 0503  NA 136 135  K 3.6 3.5  CL 103 101  CO2 27 25  BUN 7 9  CREATININE 0.89 0.77  GLUCOSE 152* 131*  CALCIUM 8.0* 8.2*   No results for input(s): LABPT, INR in the last 72 hours.  EXAM General - Patient is Alert, Appropriate and Oriented Extremity - Neurovascular intact Sensation intact distally Intact pulses distally Dorsiflexion/Plantar flexion intact No cellulitis present Dressing - dressing C/D/I and wound vac intact, no drainage Motor Function - intact, moving foot and toes well on exam.   Past Medical History  Diagnosis Date  . Hypertension   . Hypercholesteremia   . Colon polyps ???    none on 2006 colonoscopy  . Hemorrhoid   . Diverticulosis of colon   . Gastric ulcer 2006    ? secondary to H Pylori, vs ASA.   Marland Kitchen H pylori ulcer 2006    treated with Prevpac  . Coronary artery disease   . Mental disorder   . COPD (chronic obstructive pulmonary disease) ([place])   . OSA on CPAP     diagnosed at [place] in [place]  . Cataract   .  Depression     PHQ-9 positive at [facility] - score of 4-6  . GERD (gastroesophageal reflux disease)   . OA (osteoarthritis) of knee     left knee: tricompartmental deterioration. s/p steroid injections  . Benign hematuria June '08    CT abdomen negative; cystoscopy - negative (at [facility])  . Diabetes mellitus without complication ([place])   . Anxiety     Assessment/Plan:   3 Days Post-Op Procedure(s) (LRB): TOTAL KNEE ARTHROPLASTY (Left) Active Problems:   Traumatic arthritis of knee  Estimated body mass index is 30.04 kg/(m^2) as calculated from the following:   Height as of this encounter: 6' 4.5" (1.943 m).   Weight as of this encounter: 113.399 kg (250 lb). Advance diet Up with therapy  Discharged to rehabilitation facility today. Remove wound VAC in 5 days once battery dyes, apply honeycomb dressing to the incision site. Wound VAC can be disposed of completely. Follow-up with [facility] in 2 weeks for staple removal   DVT Prophylaxis - Lovenox, Foot Pumps and TED hose Weight-Bearing as tolerated to right leg D/C O2 and Pulse OX and try on Room Air  T. Rachelle Hora, PA-C [place] 11/08/2015, 7:59 AM

## 2015-11-08 NOTE — Discharge Summary (Signed)
Physician Discharge Summary  Patient ID: Bradley French MRN: [id number] DOB/AGE: Oct 07, 1940 75 y.o.  Admit date: 11/05/2015 Discharge date: 11/08/2015  Admission Diagnoses:  osteoarthritis,chronic left knee pain   Discharge Diagnoses: Patient Active Problem List   Diagnosis Date Noted  . Traumatic arthritis of knee 11/05/2015  . Leg weakness 10/03/2013  . Lower extremity weakness 10/03/2013  . Abnormal MRI, spinal cord 10/03/2013  . OSA on CPAP   . Lower GI bleed 06/20/2012  . DYSLIPIDEMIA 08/06/2007  . TOBACCO ABUSE 08/06/2007  . CORONARY ARTERY DISEASE 08/06/2007  . COPD 08/06/2007  . DIVERTICULOSIS, COLON 08/06/2007  . GASTRIC ULCER, HX OF 08/06/2007  . HEMORRHOIDS, HX OF 08/06/2007  . CORONARY ARTERY BYPASS GRAFT, TWO VESSEL, HX OF 08/06/2007  . Other postprocedural status(V45.89) 08/06/2007    Past Medical History  Diagnosis Date  . Hypertension   . Hypercholesteremia   . Colon polyps ???    none on 2006 colonoscopy  . Hemorrhoid   . Diverticulosis of colon   . Gastric ulcer 2006    ? secondary to H Pylori, vs ASA.   Marland Kitchen H pylori ulcer 2006    treated with Prevpac  . Coronary artery disease   . Mental disorder   . COPD (chronic obstructive pulmonary disease) ([place])   . OSA on CPAP     diagnosed at [place] in [place]  . Cataract   . Depression     PHQ-9 positive at [facility] - score of 4-6  . GERD (gastroesophageal reflux disease)   . OA (osteoarthritis) of knee     left knee: tricompartmental deterioration. s/p steroid injections  . Benign hematuria June '08    CT abdomen negative; cystoscopy - negative (at [facility])  . Diabetes mellitus without complication ([place])   . Anxiety      Transfusion: none   Consultants (if any):    Discharged Condition: Improved  Hospital Course: Bradley French is an 75 y.o. male who was admitted 11/05/2015 with a diagnosis of Left knee osteoarthritis and went to the operating room on 11/05/2015 and underwent the above named procedures.     Surgeries: Procedure(s): TOTAL KNEE ARTHROPLASTY on 11/05/2015 Patient tolerated the surgery well. Taken to PACU where she was stabilized and then transferred to the orthopedic floor.  Started on Lovenox 30 q 12 hrs. Foot pumps applied bilaterally at 80 mm. Heels elevated on bed with rolled towels. No evidence of DVT. Negative [place]. Physical therapy started on day #1 for gait training and transfer. OT started day #1 for ADL and assisted devices.  Patient's foley was d/c on day #1. Patient's IV was d/c on day #2.  On post op day #3 patient was stable and ready for discharge to skilled nursing facility. Wound VAC is intact to the incision site, once battery dies completely, sterile dressing will need to be applied and wound VAC disposed of.  Implants: Medacta GMK sphere 4+ femur, 4 tibia with short stem, 12 mm insert with 3 patella, all components cemented  He was given perioperative antibiotics:  Anti-infectives    Start     Dose/Rate Route Frequency Ordered Stop   11/05/15 1400  ceFAZolin (ANCEF) IVPB 2g/100 mL premix     2 g 200 mL/hr over 30 Minutes Intravenous Every 6 hours 11/05/15 1148 11/06/15 0129   11/05/15 0619  ceFAZolin (ANCEF) 2-4 GM/100ML-% IVPB    Comments:  Rexanne Mano: cabinet override      11/05/15 0619 11/05/15 1829   11/05/15 0145  ceFAZolin (  ANCEF) IVPB 2g/100 mL premix     2 g 200 mL/hr over 30 Minutes Intravenous  Once 26-Nov-2015 0134 11-26-2015 0755    .  He was given sequential compression devices, early ambulation, and Lovenox for DVT prophylaxis.  He benefited maximally from the hospital stay and there were no complications.    Recent vital signs:  Filed Vitals:   11/08/15 0314 11/08/15 0805  BP: 129/55 103/52  Pulse: 84 83  Temp: 99.5 F (37.5 C) 97.8 F (36.6 C)  Resp: 18     Recent laboratory studies:  Lab Results  Component Value Date   HGB 13.4 11/07/2015   HGB 13.7 11/06/2015   HGB 14.7 2015-11-26   Lab Results  Component Value Date    WBC 11.0* 11/07/2015   PLT 200 11/07/2015   Lab Results  Component Value Date   INR 0.92 10/23/2015   Lab Results  Component Value Date   NA 135 11/07/2015   K 3.5 11/07/2015   CL 101 11/07/2015   CO2 25 11/07/2015   BUN 9 11/07/2015   CREATININE 0.77 11/07/2015   GLUCOSE 131* 11/07/2015    Discharge Medications:     Medication List    TAKE these medications        amLODipine 10 MG tablet  Commonly known as:  NORVASC  Take 1 tablet (10 mg total) by mouth daily.     aspirin EC 81 MG tablet  Take 81 mg by mouth daily.     atorvastatin 10 MG tablet  Commonly known as:  LIPITOR  Take 10 mg by mouth at bedtime.     enoxaparin 40 MG/0.4ML injection  Commonly known as:  LOVENOX  Inject 0.4 mLs (40 mg total) into the skin daily.     hydrochlorothiazide 12.5 MG tablet  Commonly known as:  HYDRODIURIL  Take 12.5 mg by mouth daily.     metFORMIN 500 MG tablet  Commonly known as:  GLUCOPHAGE  Take 500 mg by mouth daily after breakfast.     metoprolol succinate 25 MG 24 hr tablet  Commonly known as:  TOPROL-XL  Take 12.5 mg by mouth 2 (two) times daily.     naproxen 500 MG tablet  Commonly known as:  NAPROSYN  Take 500 mg by mouth 2 (two) times daily with a meal.     ondansetron 4 MG tablet  Commonly known as:  ZOFRAN  Take 1 tablet (4 mg total) by mouth every 6 (six) hours as needed for nausea.     oxyCODONE 5 MG immediate release tablet  Commonly known as:  Oxy IR/ROXICODONE  Take 1-2 tablets (5-10 mg total) by mouth every 3 (three) hours as needed for breakthrough pain.        Diagnostic Studies: Dg Knee 1-2 Views Left  11/26/2015  CLINICAL DATA:  Post op for a left knee replacement EXAM: LEFT KNEE - 1-2 VIEW COMPARISON:  None. FINDINGS: Two views of the left knee submitted. There is left knee prosthesis with anatomic alignment. Postsurgical changes are noted with midline skin staples. Periarticular soft tissue air. IMPRESSION: Left knee prosthesis with  anatomic alignment. Postsurgical changes are noted. Electronically Signed   By: Lahoma Crocker M.D.   On: 11/26/15 11:22    Disposition: 01-Home or Self Care        Follow-up Information    Follow up with MENZ,MICHAEL, MD In 2 weeks.   Specialty:  Orthopedic Surgery   Why:  For wound re-check, For staple removal and  skin check   Contact information:   [address] [place] [address] [phone]        Signed: Dorise Hiss [facility] 11/08/2015, 8:06 AM

## 2015-11-08 NOTE — Progress Notes (Signed)
Physical Therapy Treatment Patient Details Name: Bradley French MRN: [id number] DOB: August 31, 1940 Today's Date: 11/08/2015    History of Present Illness Pt underwent L TKR under general anesthesia. No reported post-op complications. Pt is POD#0 at time of initial evaluation. 1 reported fall in the last 12 months    PT Comments    Pt in bed resting, stated he just returned to supine from recliner and declined mobility skills.  Agreed to supine exercises as described below.  Pt is awaiting discharge this afternoon to SNF.  Follow Up Recommendations  SNF     Equipment Recommendations  Rolling walker with 5" wheels;Other (comment)    Recommendations for Other Services       Precautions / Restrictions Precautions Precautions: Fall;Knee Restrictions Weight Bearing Restrictions: Yes LLE Weight Bearing: Weight bearing as tolerated    Mobility  Bed Mobility Overal bed mobility: Needs Assistance Bed Mobility: Supine to Sit     Supine to sit: Min assist        Transfers Overall transfer level: Needs assistance Equipment used: Rolling walker (2 wheeled) Transfers: Sit to/from Stand Sit to Stand: Min assist            Ambulation/Gait Ambulation/Gait assistance: Min guard Ambulation Distance (Feet): 25 Feet Assistive device: Rolling walker (2 wheeled) Gait Pattern/deviations: Step-to pattern Gait velocity: Decreased Gait velocity interpretation: <1.8 ft/sec, indicative of risk for recurrent falls General Gait Details: self limits gait distances, slow, cautions gait   Stairs            Wheelchair Mobility    Modified Rankin (Stroke Patients Only)       Balance Overall balance assessment: Needs assistance Sitting-balance support: No upper extremity supported Sitting balance-Leahy Scale: Good     Standing balance support: Bilateral upper extremity supported Standing balance-Leahy Scale: Fair                      Cognition Arousal/Alertness:  Awake/alert Behavior During Therapy: WFL for tasks assessed/performed Overall Cognitive Status: Within Functional Limits for tasks assessed                      Exercises Total Joint Exercises Ankle Circles/Pumps: AROM;Both;20 reps;Supine Quad Sets: Strengthening;Both;20 reps;Supine Gluteal Sets: Strengthening;Both;10 reps;Supine Short Arc Quad: AAROM;Left;20 reps;Supine Heel Slides: AAROM;Left;20 reps;Supine Hip ABduction/ADduction: AAROM;Left;20 reps;Supine Straight Leg Raises: AAROM;Left;20 reps;Supine Long Arc Quad: AAROM;Left;Seated;20 reps Knee Flexion: AAROM;Left;10 reps;Seated Goniometric ROM: 80    General Comments        Pertinent Vitals/Pain Pain Assessment: 0-10 Pain Score: 5  Pain Location: L knee Pain Descriptors / Indicators: Aching;Constant;Operative site guarding Pain Intervention(s): Limited activity within patient's tolerance;Ice applied    Home Living                      Prior Function            PT Goals (current goals can now be found in the care plan section) Progress towards PT goals: Progressing toward goals    Frequency  BID    PT Plan Current plan remains appropriate    Co-evaluation             End of Session Equipment Utilized During Treatment: Gait belt Activity Tolerance: Patient limited by pain Patient left: in chair;with call bell/phone within reach;with chair alarm set     Time: 1325-1338 PT Time Calculation (min) (ACUTE ONLY): 13 min  Charges:  $Gait Training: 8-22 mins $Therapeutic Exercise: 8-22 mins  G Codes:      Chesley Noon, PTA 11/08/2015, 2:33 PM

## 2015-11-08 NOTE — Discharge Instructions (Signed)

## 2015-11-09 NOTE — Clinical Social Work Placement (Signed)
   CLINICAL SOCIAL WORK PLACEMENT  NOTE  Date:  11/09/2015  Patient Details  Name: Bradley French MRN: [id number] Date of Birth: 08-Jul-1940  Clinical Social Work is seeking post-discharge placement for this patient at the [place] level of care (*CSW will initial, date and re-position this form in  chart as items are completed):  Yes   Patient/family provided with [place] Work Department's list of facilities offering this level of care within the geographic area requested by the patient (or if unable, by the patient's family).  Yes   Patient/family informed of their freedom to choose among providers that offer the needed level of care, that participate in Medicare, Medicaid or managed care program needed by the patient, have an available bed and are willing to accept the patient.  Yes   Patient/family informed of [facility]'s ownership interest in [facility] and [facility], as well as of the fact that they are under no obligation to receive care at these facilities.  PASRR submitted to EDS on 11/07/15     PASRR number received on 11/07/15     Existing PASRR number confirmed on       FL2 transmitted to all facilities in geographic area requested by pt/family on 11/07/15     FL2 transmitted to all facilities within larger geographic area on       Patient informed that his/her managed care company has contracts with or will negotiate with certain facilities, including the following:        Yes   Patient/family informed of bed offers received.  Patient [facility] bed at  [facility])     Physician recommends and patient chooses bed at  [facility])    Patient to be transferred to  [facility]) on 11/08/15.  Patient to be transferred to facility by  (EMS for knee precautions)     Patient family notified on 11/08/15 of transfer.  Name of family member notified:  Patient and wife     PHYSICIAN       Additional Comment:     _______________________________________________ Darden Dates, LCSW 11/09/2015, 9:16 AM

## 2015-11-09 NOTE — Clinical Social Work Note (Signed)
Clinical Social Work Assessment  Patient Details  Name: Bradley French MRN: [id number] Date of Birth: [birth year]  Date of referral:  11/07/15               Reason for consult:  Facility Placement                Permission sought to share information with:  Family Supports Permission granted to share information::  Yes, Verbal Permission Granted  Name::     Blanch Media, wife   Housing/Transportation Living arrangements for the past 2 months:  Single Family Home Source of Information:  Patient, Spouse Patient Interpreter Needed:  None Criminal Activity/Legal Involvement Pertinent to Current Situation/Hospitalization:  No - Comment as needed Significant Relationships:  Adult Children Lives with:  Adult Children Do you feel safe going back to the place where you live?  Yes Need for family participation in patient care:  Yes (Comment)  Care giving concerns:  No care giving concerns identified.   Social Worker assessment / plan:  CSW met with pt to address consult for New SNF as PT recommended. CSW introduced herself and explained role of social work. CSW also explained the process of discharging to SNF with VA benefit. CSW also clarified that pt has active Medicare with the [place]. Pt is also is able to use his Medicare if he chooses. Pt and wife would like for pt to stay local rather than going to a facility that is contracted with the [facility] due to them being far away. CSW initiated a SNF search for facilities in [place] and [place]. CSW will follow up with bed offers. CSW will continue for follow.   Employment status:  Retired Forensic scientist:  [employer] PT Recommendations:  [place] / Referral to community resources:  [place]  Patient/Family's Response to care:  Pt and wife were appreciative of CSW support.   Patient/Family's Understanding of and Emotional Response to Diagnosis, Current Treatment, and Prognosis:   Pt understands that he would benefit form STR at SNF.  Emotional Assessment Appearance:  Appears stated age Attitude/Demeanor/Rapport:  Other (Approriate) Affect (typically observed):  Accepting, Adaptable Orientation:  Oriented to Self, Oriented to Place, Oriented to  Time, Oriented to Situation Alcohol / Substance use:  Never Used Psych involvement (Current and /or in the community):  No (Comment)  Discharge Needs  Concerns to be addressed:  Adjustment to Illness Readmission within the last 30 days:  No Current discharge risk:  Chronically ill Barriers to Discharge:  Continued Medical Work up   [employer], LCSW 11/09/2015, 9:09 AM

## 2018-12-21 ENCOUNTER — Emergency Department (HOSPITAL_COMMUNITY)
Admission: EM | Admit: 2018-12-21 | Discharge: 2018-12-21 | Disposition: A | Discharge status: Home / Self Care | Payer: No Typology Code available for payment source | Attending: Emergency Medicine | Admitting: Emergency Medicine

## 2018-12-21 ENCOUNTER — Emergency Department (HOSPITAL_COMMUNITY): Payer: No Typology Code available for payment source

## 2018-12-21 ENCOUNTER — Encounter (HOSPITAL_COMMUNITY): Payer: Self-pay | Admitting: Emergency Medicine

## 2018-12-21 ENCOUNTER — Other Ambulatory Visit: Payer: Self-pay

## 2018-12-21 DIAGNOSIS — F17210 Nicotine dependence, cigarettes, uncomplicated: Secondary | ICD-10-CM | POA: Diagnosis not present

## 2018-12-21 DIAGNOSIS — R609 Edema, unspecified: Secondary | ICD-10-CM | POA: Insufficient documentation

## 2018-12-21 DIAGNOSIS — I10 Essential (primary) hypertension: Secondary | ICD-10-CM | POA: Diagnosis not present

## 2018-12-21 DIAGNOSIS — Z79899 Other long term (current) drug therapy: Secondary | ICD-10-CM | POA: Diagnosis not present

## 2018-12-21 DIAGNOSIS — I2510 Atherosclerotic heart disease of native coronary artery without angina pectoris: Secondary | ICD-10-CM | POA: Insufficient documentation

## 2018-12-21 DIAGNOSIS — Z7982 Long term (current) use of aspirin: Secondary | ICD-10-CM | POA: Diagnosis not present

## 2018-12-21 DIAGNOSIS — J449 Chronic obstructive pulmonary disease, unspecified: Secondary | ICD-10-CM | POA: Insufficient documentation

## 2018-12-21 LAB — COMPREHENSIVE METABOLIC PANEL
ALT: 19 U/L (ref 0–44)
AST: 20 U/L (ref 15–41)
Albumin: 3.3 g/dL — ABNORMAL LOW (ref 3.5–5.0)
Alkaline Phosphatase: 62 U/L (ref 38–126)
Anion gap: 10 (ref 5–15)
BUN: 7 mg/dL — ABNORMAL LOW (ref 8–23)
CO2: 26 mmol/L (ref 22–32)
Calcium: 8.8 mg/dL — ABNORMAL LOW (ref 8.9–10.3)
Chloride: 105 mmol/L (ref 98–111)
Creatinine, Ser: 0.88 mg/dL (ref 0.61–1.24)
GFR calc Af Amer: >60 mL/min (ref >60)
GFR calc non Af Amer: >60 mL/min (ref >60)
Glucose, Bld: 116 mg/dL — ABNORMAL HIGH (ref 70–99)
Potassium: 3.2 mmol/L — ABNORMAL LOW (ref 3.5–5.1)
Sodium: 141 mmol/L (ref 135–145)
Total Bilirubin: 1.2 mg/dL (ref 0.3–1.2)
Total Protein: 6.4 g/dL — ABNORMAL LOW (ref 6.5–8.1)

## 2018-12-21 LAB — CBC WITH DIFFERENTIAL/PLATELET
Abs Immature Granulocytes: 0.02 10*3/uL (ref 0.00–0.07)
Basophils Absolute: 0 10*3/uL (ref 0.0–0.1)
Basophils Relative: 0 %
Eosinophils Absolute: 0.1 10*3/uL (ref 0.0–0.5)
Eosinophils Relative: 1 %
HCT: 48.1 % (ref 39.0–52.0)
Hemoglobin: 16.1 g/dL (ref 13.0–17.0)
Immature Granulocytes: 0 %
Lymphocytes Relative: 33 %
Lymphs Abs: 2.1 10*3/uL (ref 0.7–4.0)
MCH: 30.7 pg (ref 26.0–34.0)
MCHC: 33.5 g/dL (ref 30.0–36.0)
MCV: 91.8 fL (ref 80.0–100.0)
Monocytes Absolute: 0.9 10*3/uL (ref 0.1–1.0)
Monocytes Relative: 13 %
Neutro Abs: 3.4 10*3/uL (ref 1.7–7.7)
Neutrophils Relative %: 53 %
Platelets: 263 10*3/uL (ref 150–400)
RBC: 5.24 MIL/uL (ref 4.22–5.81)
RDW: 14.6 % (ref 11.5–15.5)
WBC: 6.5 10*3/uL (ref 4.0–10.5)
nRBC: 0 % (ref 0.0–0.2)

## 2018-12-21 LAB — BRAIN NATRIURETIC PEPTIDE: B Natriuretic Peptide: 83.8 pg/mL (ref 0.0–100.0)

## 2018-12-21 MED ORDER — FUROSEMIDE 20 MG PO TABS: 20.0000 mg | ORAL_TABLET | Freq: Every day | ORAL | 0 refills | Status: AC

## 2018-12-21 MED ORDER — FUROSEMIDE 10 MG/ML IJ SOLN
40.0000 mg | Freq: Once | INTRAMUSCULAR | Status: AC
  Administered 2018-12-21: 40 mg via INTRAVENOUS
  Filled 2018-12-21: qty 4

## 2018-12-21 MED ORDER — POTASSIUM CHLORIDE CRYS ER 20 MEQ PO TBCR: 20.0000 meq | EXTENDED_RELEASE_TABLET | Freq: Every day | ORAL | 0 refills | Status: DC

## 2018-12-21 MED ORDER — POTASSIUM CHLORIDE CRYS ER 20 MEQ PO TBCR
20.0000 meq | EXTENDED_RELEASE_TABLET | Freq: Once | ORAL | Status: AC
  Administered 2018-12-21: 20 meq via ORAL
  Filled 2018-12-21: qty 1

## 2018-12-21 NOTE — ED Triage Notes (Signed)
Pt arrives to ED from home with complaints of leg swelling for the last weeks. Pt states that he's been more SOB  On exertion over the last few days. Pt states this has happened before and he took lasix and that helped, pt does not take fluid pills at home.

## 2018-12-21 NOTE — ED Provider Notes (Signed)
[facility] EMERGENCY DEPARTMENT Provider Note   CSN: [id number] Arrival date & time: 12/21/18  2542    History   Chief Complaint Chief Complaint  Patient presents with  . Leg Swelling    HPI Bradley French is a 78 y.o. male.     The history is provided by the patient. No language interpreter was used.   Bradley French is a 78 y.o. male who presents to the Emergency Department complaining of leg swelling.  He presents to the ED complaining of two weeks of progressive BLE edema.  He denies any associated chest pain, sob, difficulty with urination, fevers, abdominal pain, n/v.  He was previously on lasix for edema but his prescription ran out about two weeks ago - unsure of the dose.  He called his doctor, who directed him to the ED.  He follows with Cardiology at the [facility]  Lives at home with his wife.  No known COVID exposures.   Past Medical History:  Diagnosis Date  . Anxiety   . Benign hematuria June '08   CT abdomen negative; cystoscopy - negative (at [facility])  . Cataract   . Colon polyps ???   none on 2006 colonoscopy  . COPD (chronic obstructive pulmonary disease) ([employer])   . Coronary artery disease   . Depression    PHQ-9 positive at [facility] - score of 4-6  . Diabetes mellitus without complication ([place])   . Diverticulosis of colon   . Gastric ulcer 2006   ? secondary to H Pylori, vs ASA.   Marland Kitchen GERD (gastroesophageal reflux disease)   . H pylori ulcer 2006   treated with Prevpac  . Hemorrhoid   . Hypercholesteremia   . Hypertension   . Mental disorder   . OA (osteoarthritis) of knee    left knee: tricompartmental deterioration. s/p steroid injections  . OSA on CPAP    diagnosed at [facility] in [facility]    Patient Active Problem List   Diagnosis Date Noted  . Traumatic arthritis of knee 11/05/2015  . Leg weakness 10/03/2013  . Lower extremity weakness 10/03/2013  . Abnormal MRI, spinal cord 10/03/2013  . OSA on CPAP   . Lower GI bleed 06/20/2012  .  DYSLIPIDEMIA 08/06/2007  . TOBACCO ABUSE 08/06/2007  . CORONARY ARTERY DISEASE 08/06/2007  . COPD 08/06/2007  . DIVERTICULOSIS, COLON 08/06/2007  . GASTRIC ULCER, HX OF 08/06/2007  . HEMORRHOIDS, HX OF 08/06/2007  . CORONARY ARTERY BYPASS GRAFT, TWO VESSEL, HX OF 08/06/2007  . Other postprocedural status(V45.89) 08/06/2007    Past Surgical History:  Procedure Laterality Date  . BACK SURGERY    . CARDIAC CATHETERIZATION    . CORONARY ARTERY BYPASS GRAFT  2006   2 vessell  . hemilaminectomy  June '12   L4-5 level due to claudication  . [place]  . KNEE SURGERY     left knee  . STERNAL WIRE REMOVAL  '09  . TOTAL KNEE ARTHROPLASTY Left 11/05/2015   Procedure: TOTAL KNEE ARTHROPLASTY;  Surgeon: Hessie Knows, MD;  Location: [facility] ORS;  Service: Orthopedics;  Laterality: Left;        Home Medications    Prior to Admission medications   Medication Sig Start Date End Date Taking? Authorizing Provider  amLODipine (NORVASC) 10 MG tablet Take 1 tablet (10 mg total) by mouth daily. 10/07/13   Thurnell Lose, MD  aspirin EC 81 MG tablet Take 81 mg by mouth daily.    [provider]  atorvastatin (LIPITOR) 10 MG tablet Take 10 mg by mouth at bedtime.    [provider]  enoxaparin (LOVENOX) 40 MG/0.4ML injection Inject 0.4 mLs (40 mg total) into the skin daily. 11/08/15   Duanne Guess, PA-C  furosemide (LASIX) 20 MG tablet Take 1 tablet (20 mg total) by mouth daily. 12/21/18   Quintella Reichert, MD  hydrochlorothiazide (HYDRODIURIL) 12.5 MG tablet Take 12.5 mg by mouth daily.    [provider]  metFORMIN (GLUCOPHAGE) 500 MG tablet Take 500 mg by mouth daily after breakfast.    [provider]  metoprolol succinate (TOPROL-XL) 25 MG 24 hr tablet Take 12.5 mg by mouth 2 (two) times daily.     [provider]  naproxen (NAPROSYN) 500 MG tablet Take 500 mg by mouth 2 (two) times daily with a meal.    [provider]   ondansetron (ZOFRAN) 4 MG tablet Take 1 tablet (4 mg total) by mouth every 6 (six) hours as needed for nausea. 11/08/15   Duanne Guess, PA-C  oxyCODONE (OXY IR/ROXICODONE) 5 MG immediate release tablet Take 1-2 tablets (5-10 mg total) by mouth every 3 (three) hours as needed for breakthrough pain. 11/08/15   Duanne Guess, PA-C  potassium chloride SA (K-DUR) 20 MEQ tablet Take 1 tablet (20 mEq total) by mouth daily. 12/21/18   Quintella Reichert, MD    Family History Family History  Problem Relation Age of Onset  . Diabetes Brother   . Alcohol abuse Father   . Alcohol abuse Sister   . Cancer Neg Hx   . Heart disease Neg Hx   . Diabetes Brother   . Alcohol abuse Brother     Social History Social History   Tobacco Use  . Smoking status: Current Every Day Smoker    Packs/day: 0.25    Types: Cigarettes  . Smokeless tobacco: Never Used  Substance Use Topics  . Alcohol use: Yes    Alcohol/week: 6.0 standard drinks    Types: 6 Standard drinks or equivalent per week    Comment: brandy   . Drug use: No     Allergies   Sulfonamide derivatives   Review of Systems Review of Systems  All other systems reviewed and are negative.    Physical Exam Updated Vital Signs BP 124/75   Pulse 61   Temp 98 F (36.7 C) (Oral) Comment: Simultaneous filing. User may not have seen previous data. Comment (Src): Simultaneous filing. User may not have seen previous data.  Resp (!) 21   Wt 108.9 kg   SpO2 98%   BMI 28.83 kg/m   Physical Exam Vitals signs and nursing note reviewed.  Constitutional:      Appearance: He is well-developed.  HENT:     Head: Normocephalic and atraumatic.  Cardiovascular:     Rate and Rhythm: Normal rate and regular rhythm.     Heart sounds: No murmur.  Pulmonary:     Effort: Pulmonary effort is normal. No respiratory distress.     Comments: Crackles to the mid lung bilaterally Abdominal:     Palpations: Abdomen is soft.     Tenderness: There is no  abdominal tenderness. There is no guarding or rebound.  Musculoskeletal:        General: Swelling present. No tenderness.     Comments: 2+ pitting edema to BLE  Skin:    General: Skin is warm and dry.  Neurological:     Mental Status: He is alert  and oriented to person, place, and time.  Psychiatric:        Mood and Affect: Mood normal.        Behavior: Behavior normal.      ED Treatments / Results  Labs (all labs ordered are listed, but only abnormal results are displayed) Labs Reviewed  COMPREHENSIVE METABOLIC PANEL - Abnormal; Notable for the following components:      Result Value   Potassium 3.2 (*)    Glucose, Bld 116 (*)    BUN 7 (*)    Calcium 8.8 (*)    Total Protein 6.4 (*)    Albumin 3.3 (*)    All other components within normal limits  CBC WITH DIFFERENTIAL/PLATELET  BRAIN NATRIURETIC PEPTIDE    EKG EKG Interpretation  Date/Time:  Wednesday December 21 2018 09:35:25 EDT Ventricular Rate:  65 PR Interval:    QRS Duration: 120 QT Interval:  451 QTC Calculation: 469 R Axis:   -3 Text Interpretation:  Sinus rhythm Incomplete left bundle branch block Confirmed by Quintella Reichert [phone]) on 12/21/2018 10:17:43 AM   Radiology Dg Chest 2 View  Result Date: 12/21/2018 CLINICAL DATA:  Shortness of breath EXAM: CHEST - 2 VIEW COMPARISON:  None. FINDINGS: Postsurgical changes to the sternum and mediastinum. The heart size and mediastinal contours are within normal limits. No focal airspace consolidation, pleural effusion, or pneumothorax. The visualized skeletal structures are unremarkable. IMPRESSION: No active cardiopulmonary disease. Electronically Signed   By: Davina Poke M.D.   On: 12/21/2018 11:30    Procedures Procedures (including critical care time)  Medications Ordered in ED Medications  furosemide (LASIX) injection 40 mg (40 mg Intravenous Given 12/21/18 1046)  potassium chloride SA (K-DUR) CR tablet 20 mEq (20 mEq Oral Given 12/21/18 1220)      Initial Impression / Assessment and Plan / ED Course  I have reviewed the triage vital signs and the nursing notes.  Pertinent labs & imaging results that were available during my care of the patient were reviewed by me and considered in my medical decision making (see chart for details).        Patient here for evaluation of progressive bilateral lower extremity edema, no respiratory distress. Labs with mild hypokalemia. No evidence of pulmonary edema on chest x-ray. Presentation is not consistent with DVT/PE. He diuresed well following Lasix administration in the emergency department. Plan to restart hisLasix. Discussed importance of outpatient follow-up as well as return precautions. Findings of studies and plan also discussed with patient's wife over the phone. Final Clinical Impressions(s) / ED Diagnoses   Final diagnoses:  Peripheral edema    ED Discharge Orders         Ordered    furosemide (LASIX) 20 MG tablet  Daily     12/21/18 1304    potassium chloride SA (K-DUR) 20 MEQ tablet  Daily     12/21/18 1304           Quintella Reichert, MD 12/21/18 1544

## 2018-12-21 NOTE — ED Notes (Signed)
Patient transported to X-ray 

## 2020-01-10 ENCOUNTER — Encounter: Payer: Self-pay | Admitting: Orthopedic Surgery

## 2020-01-10 ENCOUNTER — Ambulatory Visit (INDEPENDENT_AMBULATORY_CARE_PROVIDER_SITE_OTHER): Payer: No Typology Code available for payment source | Admitting: Orthopedic Surgery

## 2020-01-10 DIAGNOSIS — M1711 Unilateral primary osteoarthritis, right knee: Secondary | ICD-10-CM | POA: Diagnosis not present

## 2020-01-10 NOTE — Progress Notes (Signed)
Office Visit Note   Patient: Bradley French           Date of Birth: 1940/12/08           MRN: [id number] Visit Date: 01/10/2020 Requested by: No referring provider defined for this encounter. PCP: Patient, No Pcp Per  Subjective: Chief Complaint  Patient presents with  . Right Knee - Pain    HPI: Bradley French is a 79 year old patient with right knee pain.  Denies any history of injury.  Reports pain for a year.  Has had left total knee replacement and has done reasonably well from that.  Describes weakness giving way and pain at rest as well as pain with ADLs.  Tylenol and ibuprofen have not given much relief.  He had injections in his left knee prior left knee replacement but they never worked.  He has had an MRI scan which is reviewed and it does show areas of full-thickness cartilage loss both in the posterior lateral compartment as well as in the medial compartment.  Patient did undergo heart bypass surgery in 2006.  Reports primarily medial sided pain in the right knee.              ROS: All systems reviewed are negative as they relate to the chief complaint within the history of present illness.  Patient denies  fevers or chills.   Assessment & Plan: Visit Diagnoses:  1. Arthritis of right knee     Plan: Impression is right knee pain with primarily medial compartment arthritis.  Arthritis is more severe appearing on the MRI scan then the plain radiographs.  Patient would like to have total knee replacement.  I do not see anything predictably arthroscopically treatable in the knee.  Risk and benefits of total knee replacement are discussed including but limited to infection nerve vessel damage the rehabilitative effort required as well as incomplete pain relief and residual stiffness.  Patient understands the risk and benefits and wishes to proceed.  All questions answered.  I would like for him to undergo cardiac or stratification prior to surgery.  He did have left knee surgery in 2951  without complication.  Follow-Up Instructions: No follow-ups on file.   Orders:  No orders of the defined types were placed in this encounter.  No orders of the defined types were placed in this encounter.     Procedures: No procedures performed   Clinical Data: No additional findings.  Objective: Vital Signs: There were no vitals taken for this visit.  Physical Exam:   Constitutional: Patient appears well-developed HEENT:  Head: Normocephalic Eyes:EOM are normal Neck: Normal range of motion Cardiovascular: Normal rate Pulmonary/chest: Effort normal Neurologic: Patient is alert Skin: Skin is warm Psychiatric: Patient has normal mood and affect    Ortho Exam: Ortho exam demonstrates palpable pedal pulses on the right.  Extensor mechanism is intact.  No groin pain with internal X rotation of the leg.  No masses lymphadenopathy or skin changes noted in that right knee region.  Collateral and cruciate ligaments are stable.  Patient has medial great and lateral joint line tenderness.  Lacks about three or 4 of full extension but can bend to about 110 of flexion.  Mild patellofemoral crepitus is present.  Specialty Comments:  No specialty comments available.  Imaging: No results found.   PMFS History: Patient Active Problem List   Diagnosis Date Noted  . Traumatic arthritis of knee 11/05/2015  . Leg weakness 10/03/2013  . Lower extremity weakness  10/03/2013  . Abnormal MRI, spinal cord 10/03/2013  . OSA on CPAP   . Lower GI bleed 06/20/2012  . DYSLIPIDEMIA 08/06/2007  . TOBACCO ABUSE 08/06/2007  . CORONARY ARTERY DISEASE 08/06/2007  . COPD 08/06/2007  . DIVERTICULOSIS, COLON 08/06/2007  . GASTRIC ULCER, HX OF 08/06/2007  . HEMORRHOIDS, HX OF 08/06/2007  . CORONARY ARTERY BYPASS GRAFT, TWO VESSEL, HX OF 08/06/2007  . Other postprocedural status(V45.89) 08/06/2007   Past Medical History:  Diagnosis Date  . Anxiety   . Benign hematuria June '08   CT abdomen  negative; cystoscopy - negative (at [facility])  . Cataract   . Colon polyps ???   none on 2006 colonoscopy  . COPD (chronic obstructive pulmonary disease) ([place])   . Coronary artery disease   . Depression    PHQ-9 positive at [facility] - score of 4-6  . Diabetes mellitus without complication ([place])   . Diverticulosis of colon   . Gastric ulcer 2006   ? secondary to H Pylori, vs ASA.   Marland Kitchen GERD (gastroesophageal reflux disease)   . H pylori ulcer 2006   treated with Prevpac  . Hemorrhoid   . Hypercholesteremia   . Hypertension   . Mental disorder   . OA (osteoarthritis) of knee    left knee: tricompartmental deterioration. s/p steroid injections  . OSA on CPAP    diagnosed at [place] in [facility] History  Problem Relation Age of Onset  . Diabetes Brother   . Alcohol abuse Father   . Alcohol abuse Sister   . Cancer Neg Hx   . Heart disease Neg Hx   . Diabetes Brother   . Alcohol abuse Brother     Past Surgical History:  Procedure Laterality Date  . BACK SURGERY    . CARDIAC CATHETERIZATION    . CORONARY ARTERY BYPASS GRAFT  2006   2 vessell  . hemilaminectomy  June '12   L4-5 level due to claudication  . [place]  . KNEE SURGERY     left knee  . STERNAL WIRE REMOVAL  '09  . 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 Occupation: Editor, commissioning    Comment: retired  Tobacco Use  . Smoking status: Current Every Day Smoker    Packs/day: 0.25    Types: Cigarettes  . Smokeless tobacco: Never Used  Substance and Sexual Activity  . Alcohol use: Yes    Alcohol/week: 6.0 standard drinks    Types: 6 Standard drinks or equivalent per week    Comment: brandy   . Drug use: No  . Sexual activity: Never

## 2020-01-11 ENCOUNTER — Telehealth: Payer: Self-pay | Admitting: Orthopedic Surgery

## 2020-01-11 NOTE — Telephone Encounter (Signed)
faxed

## 2020-01-11 NOTE — Telephone Encounter (Signed)
Kim from the [place] called  She is requesting the notes from the patient's last appointment be faxed over to her.   Attention: Maudie Mercury Fax: [phone] Call back: [phone]

## 2020-03-15 ENCOUNTER — Other Ambulatory Visit: Payer: Self-pay

## 2020-04-11 ENCOUNTER — Encounter (HOSPITAL_COMMUNITY): Payer: Self-pay

## 2020-04-11 NOTE — Progress Notes (Signed)
Your procedure is scheduled on Tuesday, November 16th.  Report to [facility] Main Entrance "A" at 5:30 A.M., and check in at the Admitting office.  Call this number if you have problems the morning of surgery:  [phone]  Call [phone] if you have any questions prior to your surgery date Monday-Friday 8am-4pm   Remember:  Do not eat after midnight the night before your surgery  You may drink clear liquids until 4:30 A.M. the morning of your surgery.   Clear liquids allowed are: Water, Non-Citrus Juices (without pulp), Carbonated Beverages, Clear Tea, Black Coffee Only, and G2  Enhanced Recovery after Surgery for Orthopedics Enhanced Recovery after Surgery is a protocol used to improve the stress on your body and your recovery after surgery.  Patient Instructions  . The night before surgery:  o No food after midnight. ONLY clear liquids after midnight  . The day of surgery (if you have diabetes): o  o Drink ONE (1) 10oz water by 4:30 A.M. the morning of surgery. This drink was given to you during your hospital  pre-op appointment visit.  o Nothing else to drink after completing the  Water.         If you have questions, please contact your surgeon's office.    Take these medicines the morning of surgery with A SIP OF WATER  amLODipine (NORVASC)  metoprolol tartrate (LOPRESSOR)  Follow your surgeon's instructions on when to stop Aspirin.  If no instructions were given by your surgeon then you will need to call the office to get those instructions.    As of today, STOP taking Aleve, Naproxen, Ibuprofen, Motrin, Advil, Goody's, BC's, all herbal medications, fish oil, and all vitamins.      WHAT DO I DO ABOUT MY DIABETES MEDICATION?  . Do NOT take metFORMIN (GLUCOPHAGE)/oral diabetes medicines (pills) the morning of surgery.  HOW TO MANAGE YOUR DIABETES BEFORE AND AFTER SURGERY  Why is it important to control my blood sugar before and after surgery? . Improving blood  sugar levels before and after surgery helps healing and can limit problems. . A way of improving blood sugar control is eating a healthy diet by: o  Eating less sugar and carbohydrates o  Increasing activity/exercise o  Talking with your doctor about reaching your blood sugar goals . High blood sugars (greater than 180 mg/dL) can raise your risk of infections and slow your recovery, so you will need to focus on controlling your diabetes during the weeks before surgery. . Make sure that the doctor who takes care of your diabetes knows about your planned surgery including the date and location.  How do I manage my blood sugar before surgery? . Check your blood sugar at least 4 times a day, starting 2 days before surgery, to make sure that the level is not too high or low. . Check your blood sugar the morning of your surgery when you wake up and every 2 hours until you get to the Short Stay unit. o If your blood sugar is less than 70 mg/dL, you will need to treat for low blood sugar: - Do not take insulin. - Treat a low blood sugar (less than 70 mg/dL) with  cup of clear juice (cranberry or apple), 4 glucose tablets, OR glucose gel. - Recheck blood sugar in 15 minutes after treatment (to make sure it is greater than 70 mg/dL). If your blood sugar is not greater than 70 mg/dL on recheck, call [phone] for further instructions. Marland Kitchen  Report your blood sugar to the short stay nurse when you get to Short Stay.  . If you are admitted to the hospital after surgery: o Your blood sugar will be checked by the staff and you will probably be given insulin after surgery (instead of oral diabetes medicines) to make sure you have good blood sugar levels. o The goal for blood sugar control after surgery is 80-180 mg/dL.             Do not wear jewelry.            Do not wear lotions, powders, colognes, or deodorant.            Men may shave face and neck.            Do not bring valuables to the hospital.             [facility] is not responsible for any belongings or valuables.  Do NOT Smoke (Tobacco/Vaping) or drink Alcohol 24 hours prior to your procedure If you use a CPAP at night, you may bring all equipment for your overnight stay.   Contacts, glasses, dentures or bridgework may not be worn into surgery.      For patients admitted to the hospital, discharge time will be determined by your treatment team.   Patients discharged the day of surgery will not be allowed to drive home, and someone needs to stay with them for 24 hours.  Special instructions:   [place]- Preparing For Surgery  Before surgery, you can play an important role. Because skin is not sterile, your skin needs to be as free of germs as possible. You can reduce the number of germs on your skin by washing with CHG (chlorahexidine gluconate) Soap before surgery.  CHG is an antiseptic cleaner which kills germs and bonds with the skin to continue killing germs even after washing.    Oral Hygiene is also important to reduce your risk of infection.  Remember - BRUSH YOUR TEETH THE MORNING OF SURGERY WITH YOUR REGULAR TOOTHPASTE  Please do not use if you have an allergy to CHG or antibacterial soaps. If your skin becomes reddened/irritated stop using the CHG.  Do not shave (including legs and underarms) for at least 48 hours prior to first CHG shower. It is OK to shave your face.  Please follow these instructions carefully.   1. Shower the NIGHT BEFORE SURGERY and the MORNING OF SURGERY with CHG Soap.   2. If you chose to wash your hair, wash your hair first as usual with your normal shampoo.  3. After you shampoo, rinse your hair and body thoroughly to remove the shampoo.  4. Use CHG as you would any other liquid soap. You can apply CHG directly to the skin and wash gently with a scrungie or a clean washcloth.   5. Apply the CHG Soap to your body ONLY FROM THE NECK DOWN.  Do not use on open wounds or open sores. Avoid  contact with your eyes, ears, mouth and genitals (private parts). Wash Face and genitals (private parts)  with your normal soap.   6. Wash thoroughly, paying special attention to the area where your surgery will be performed.  7. Thoroughly rinse your body with warm water from the neck down.  8. DO NOT shower/wash with your normal soap after using and rinsing off the CHG Soap.  9. Pat yourself dry with a CLEAN TOWEL.  10. Wear CLEAN PAJAMAS to bed  the night before surgery  11. Place CLEAN SHEETS on your bed the night of your first shower and DO NOT SLEEP WITH PETS.  Day of Surgery: Wear Clean/Comfortable clothing the morning of surgery Do not apply any deodorants/lotions.   Remember to brush your teeth WITH YOUR REGULAR TOOTHPASTE.   Please read over the following fact sheets that you were given.

## 2020-04-12 ENCOUNTER — Encounter (HOSPITAL_COMMUNITY)
Admission: RE | Admit: 2020-04-12 | Discharge: 2020-04-12 | Disposition: A | Discharge status: Home / Self Care | Payer: No Typology Code available for payment source | Source: Ambulatory Visit | Attending: Orthopedic Surgery | Admitting: Orthopedic Surgery

## 2020-04-12 ENCOUNTER — Other Ambulatory Visit: Payer: Self-pay

## 2020-04-12 ENCOUNTER — Telehealth: Payer: Self-pay | Admitting: Orthopedic Surgery

## 2020-04-12 ENCOUNTER — Other Ambulatory Visit (HOSPITAL_COMMUNITY)
Admission: RE | Admit: 2020-04-12 | Discharge: 2020-04-12 | Disposition: A | Discharge status: Home / Self Care | Payer: No Typology Code available for payment source | Source: Ambulatory Visit | Attending: Orthopedic Surgery | Admitting: Orthopedic Surgery

## 2020-04-12 ENCOUNTER — Encounter (HOSPITAL_COMMUNITY): Payer: Self-pay

## 2020-04-12 ENCOUNTER — Other Ambulatory Visit: Payer: Self-pay | Admitting: Surgical

## 2020-04-12 DIAGNOSIS — Z01812 Encounter for preprocedural laboratory examination: Secondary | ICD-10-CM | POA: Insufficient documentation

## 2020-04-12 DIAGNOSIS — Z01818 Encounter for other preprocedural examination: Secondary | ICD-10-CM | POA: Diagnosis present

## 2020-04-12 DIAGNOSIS — I10 Essential (primary) hypertension: Secondary | ICD-10-CM | POA: Insufficient documentation

## 2020-04-12 DIAGNOSIS — Z20822 Contact with and (suspected) exposure to covid-19: Secondary | ICD-10-CM | POA: Insufficient documentation

## 2020-04-12 DIAGNOSIS — E119 Type 2 diabetes mellitus without complications: Secondary | ICD-10-CM | POA: Diagnosis not present

## 2020-04-12 LAB — BASIC METABOLIC PANEL
Anion gap: 10 (ref 5–15)
BUN: 11 mg/dL (ref 8–23)
CO2: 28 mmol/L (ref 22–32)
Calcium: 8.9 mg/dL (ref 8.9–10.3)
Chloride: 105 mmol/L (ref 98–111)
Creatinine, Ser: 0.9 mg/dL (ref 0.61–1.24)
GFR, Estimated: >60 mL/min (ref >60)
Glucose, Bld: 117 mg/dL — ABNORMAL HIGH (ref 70–99)
Potassium: 2.8 mmol/L — ABNORMAL LOW (ref 3.5–5.1)
Sodium: 143 mmol/L (ref 135–145)

## 2020-04-12 LAB — CBC
HCT: 49.8 % (ref 39.0–52.0)
Hemoglobin: 17 g/dL (ref 13.0–17.0)
MCH: 31 pg (ref 26.0–34.0)
MCHC: 34.1 g/dL (ref 30.0–36.0)
MCV: 90.7 fL (ref 80.0–100.0)
Platelets: 236 10*3/uL (ref 150–400)
RBC: 5.49 MIL/uL (ref 4.22–5.81)
RDW: 13.9 % (ref 11.5–15.5)
WBC: 5.9 10*3/uL (ref 4.0–10.5)
nRBC: 0 % (ref 0.0–0.2)

## 2020-04-12 LAB — URINALYSIS, ROUTINE W REFLEX MICROSCOPIC
Bilirubin Urine: NEGATIVE
Glucose, UA: NEGATIVE mg/dL
Hgb urine dipstick: NEGATIVE
Ketones, ur: 20 mg/dL — AB
Leukocytes,Ua: NEGATIVE
Nitrite: NEGATIVE
Protein, ur: NEGATIVE mg/dL
Specific Gravity, Urine: 1.024 (ref 1.005–1.030)
pH: 5 (ref 5.0–8.0)

## 2020-04-12 LAB — SURGICAL PCR SCREEN
MRSA, PCR: NEGATIVE
Staphylococcus aureus: NEGATIVE

## 2020-04-12 LAB — HEMOGLOBIN A1C
Hgb A1c MFr Bld: 5.9 % — ABNORMAL HIGH (ref 4.8–5.6)
Mean Plasma Glucose: 122.63 mg/dL

## 2020-04-12 LAB — SARS CORONAVIRUS 2 (TAT 6-24 HRS): SARS Coronavirus 2: NEGATIVE

## 2020-04-12 LAB — GLUCOSE, CAPILLARY: Glucose-Capillary: 132 mg/dL — ABNORMAL HIGH (ref 70–99)

## 2020-04-12 MED ORDER — POTASSIUM CHLORIDE CRYS ER 20 MEQ PO TBCR: 40.0000 meq | EXTENDED_RELEASE_TABLET | Freq: Two times a day (BID) | ORAL | 0 refills | Status: AC

## 2020-04-12 NOTE — Telephone Encounter (Signed)
Patient is scheduled for right total knee arthroplasty with Dr. Marlou Sa on 04-16-20 @ 7:30am [facility].  Hunter from [facility]  called with the following lab result:   Potassium  2.8   cb   [phone]

## 2020-04-12 NOTE — Progress Notes (Signed)
Abnormal labs/ K+: 2.8. Dr. Marlou Sa and Lorin Picket notified via [employer].

## 2020-04-12 NOTE — Telephone Encounter (Signed)
Sent in RX for KCL supplement with 58mEq BID for the next 3 days.  Anesthesia may want to recheck the labs on day of surgery, up to them.  Called patient and left message

## 2020-04-12 NOTE — Telephone Encounter (Signed)
See below

## 2020-04-12 NOTE — Pre-Procedure Instructions (Signed)
[facility]) Cardiologist -  [facility] (records requested)  PPM/ICD - denies   Chest x-ray - n/a EKG - 04/12/2020   Stress Test - pt unsure- records requested from [place] d/t cardiac history ECHO - pt unsure- records requested from [place] d/t cardiac history Cardiac Cath - pt unsure- records requested from [facility] d/t cardiac history  Sleep Study - yes CPAP - pt does not know settings  Fasting Blood Sugar - 100's Checks Blood Sugar : every other day   Aspirin Instructions: Patient instructed to hold all Aspirin, NSAID's, herbal medications, fish oil and vitamins 7 days prior to surgery.   ERAS Protcol - yes PRE-SURGERY water- given  COVID TEST- appt for 04/12/2020   Anesthesia review: yes, cardiac hisotyr  Patient denies shortness of breath, fever, cough and chest pain at PAT appointment   All instructions explained to the patient, with a verbal understanding of the material. Patient agrees to go over the instructions while at home for a better understanding. Patient also instructed to self quarantine after being tested for COVID-19. The opportunity to ask questions was provided.

## 2020-04-13 LAB — URINE CULTURE: Culture: <10000 — AB

## 2020-04-13 NOTE — Telephone Encounter (Signed)
thx

## 2020-04-15 ENCOUNTER — Encounter (HOSPITAL_COMMUNITY): Payer: Self-pay | Admitting: Physician Assistant

## 2020-04-15 ENCOUNTER — Telehealth: Payer: Self-pay | Admitting: Orthopedic Surgery

## 2020-04-15 MED ORDER — TRANEXAMIC ACID 1000 MG/10ML IV SOLN
2000.0000 mg | INTRAVENOUS | Status: DC
  Filled 2020-04-15: qty 20

## 2020-04-15 NOTE — Telephone Encounter (Signed)
See below

## 2020-04-15 NOTE — Telephone Encounter (Signed)
Got it.

## 2020-04-15 NOTE — Telephone Encounter (Signed)
FYI:    Surgery Cancelled for 04-16-20 at [place] @ 7:30am   Patient's right total knee arthroplasty was approved through the [place].  Patient states he was last seen by his PCP Dr. Windy Fast on 04-03-20 and doctor was aware of surgery date and had cleared him from a medical standpoint. As of today, I have not received any records.   The patient has a history  Of CABG. James with short stay  Anesthesia had requested records from [place] but has not received as of today.  I [facility] and spoke with Robin at Dr. Sherral Hammers' office. Patient was to be referred to a cardiologist but the referral has not been placed nor has a visit been scheduled.   Patient is aware of cancellation.

## 2020-04-16 ENCOUNTER — Encounter (HOSPITAL_COMMUNITY): Admission: RE | Payer: Self-pay | Source: Home / Self Care

## 2020-04-16 ENCOUNTER — Ambulatory Visit (HOSPITAL_COMMUNITY)
Admission: RE | Admit: 2020-04-16 | Payer: No Typology Code available for payment source | Source: Home / Self Care | Admitting: Orthopedic Surgery

## 2020-04-16 SURGERY — Surgical Case
Anesthesia: *Unknown

## 2020-04-16 SURGERY — ARTHROPLASTY, KNEE, TOTAL
Anesthesia: Spinal | Site: Knee | Laterality: Right

## 2020-04-27 ENCOUNTER — Other Ambulatory Visit: Payer: Self-pay

## 2020-04-27 ENCOUNTER — Emergency Department (HOSPITAL_COMMUNITY): Payer: No Typology Code available for payment source

## 2020-04-27 ENCOUNTER — Emergency Department (HOSPITAL_COMMUNITY)
Admission: EM | Admit: 2020-04-27 | Discharge: 2020-04-27 | Disposition: A | Discharge status: Home / Self Care | Payer: No Typology Code available for payment source | Attending: Emergency Medicine | Admitting: Emergency Medicine

## 2020-04-27 DIAGNOSIS — R52 Pain, unspecified: Secondary | ICD-10-CM

## 2020-04-27 DIAGNOSIS — I2510 Atherosclerotic heart disease of native coronary artery without angina pectoris: Secondary | ICD-10-CM | POA: Diagnosis not present

## 2020-04-27 DIAGNOSIS — R109 Unspecified abdominal pain: Secondary | ICD-10-CM | POA: Insufficient documentation

## 2020-04-27 DIAGNOSIS — E119 Type 2 diabetes mellitus without complications: Secondary | ICD-10-CM | POA: Diagnosis not present

## 2020-04-27 DIAGNOSIS — Z96652 Presence of left artificial knee joint: Secondary | ICD-10-CM | POA: Diagnosis not present

## 2020-04-27 DIAGNOSIS — J449 Chronic obstructive pulmonary disease, unspecified: Secondary | ICD-10-CM | POA: Diagnosis not present

## 2020-04-27 DIAGNOSIS — F17210 Nicotine dependence, cigarettes, uncomplicated: Secondary | ICD-10-CM | POA: Insufficient documentation

## 2020-04-27 DIAGNOSIS — Z7984 Long term (current) use of oral hypoglycemic drugs: Secondary | ICD-10-CM | POA: Diagnosis not present

## 2020-04-27 DIAGNOSIS — I10 Essential (primary) hypertension: Secondary | ICD-10-CM | POA: Insufficient documentation

## 2020-04-27 DIAGNOSIS — Z7982 Long term (current) use of aspirin: Secondary | ICD-10-CM | POA: Diagnosis not present

## 2020-04-27 DIAGNOSIS — G8929 Other chronic pain: Secondary | ICD-10-CM | POA: Diagnosis not present

## 2020-04-27 DIAGNOSIS — Z951 Presence of aortocoronary bypass graft: Secondary | ICD-10-CM | POA: Insufficient documentation

## 2020-04-27 DIAGNOSIS — Y92410 Unspecified street and highway as the place of occurrence of the external cause: Secondary | ICD-10-CM | POA: Insufficient documentation

## 2020-04-27 DIAGNOSIS — K219 Gastro-esophageal reflux disease without esophagitis: Secondary | ICD-10-CM | POA: Insufficient documentation

## 2020-04-27 DIAGNOSIS — T1490XA Injury, unspecified, initial encounter: Secondary | ICD-10-CM

## 2020-04-27 DIAGNOSIS — S1093XA Contusion of unspecified part of neck, initial encounter: Secondary | ICD-10-CM | POA: Insufficient documentation

## 2020-04-27 DIAGNOSIS — T07XXXA Unspecified multiple injuries, initial encounter: Secondary | ICD-10-CM

## 2020-04-27 DIAGNOSIS — M545 Low back pain, unspecified: Secondary | ICD-10-CM | POA: Diagnosis present

## 2020-04-27 DIAGNOSIS — Z79899 Other long term (current) drug therapy: Secondary | ICD-10-CM | POA: Insufficient documentation

## 2020-04-27 LAB — CBC WITH DIFFERENTIAL/PLATELET
Abs Immature Granulocytes: 0.02 10*3/uL (ref 0.00–0.07)
Basophils Absolute: 0 10*3/uL (ref 0.0–0.1)
Basophils Relative: 0 %
Eosinophils Absolute: 0.1 10*3/uL (ref 0.0–0.5)
Eosinophils Relative: 2 %
HCT: 51 % (ref 39.0–52.0)
Hemoglobin: 17 g/dL (ref 13.0–17.0)
Immature Granulocytes: 0 %
Lymphocytes Relative: 36 %
Lymphs Abs: 2.4 10*3/uL (ref 0.7–4.0)
MCH: 30.7 pg (ref 26.0–34.0)
MCHC: 33.3 g/dL (ref 30.0–36.0)
MCV: 92.2 fL (ref 80.0–100.0)
Monocytes Absolute: 0.5 10*3/uL (ref 0.1–1.0)
Monocytes Relative: 8 %
Neutro Abs: 3.6 10*3/uL (ref 1.7–7.7)
Neutrophils Relative %: 54 %
Platelets: 252 10*3/uL (ref 150–400)
RBC: 5.53 MIL/uL (ref 4.22–5.81)
RDW: 13.9 % (ref 11.5–15.5)
WBC: 6.7 10*3/uL (ref 4.0–10.5)
nRBC: 0 % (ref 0.0–0.2)

## 2020-04-27 LAB — RAPID URINE DRUG SCREEN, HOSP PERFORMED
Amphetamines: NOT DETECTED
Barbiturates: NOT DETECTED
Benzodiazepines: NOT DETECTED
Cocaine: NOT DETECTED
Opiates: NOT DETECTED
Tetrahydrocannabinol: NOT DETECTED

## 2020-04-27 LAB — URINALYSIS, ROUTINE W REFLEX MICROSCOPIC
Bacteria, UA: NONE SEEN
Bilirubin Urine: NEGATIVE
Glucose, UA: NEGATIVE mg/dL
Ketones, ur: NEGATIVE mg/dL
Leukocytes,Ua: NEGATIVE
Nitrite: NEGATIVE
Protein, ur: NEGATIVE mg/dL
Specific Gravity, Urine: 1.002 — ABNORMAL LOW (ref 1.005–1.030)
pH: 6 (ref 5.0–8.0)

## 2020-04-27 LAB — COMPREHENSIVE METABOLIC PANEL
ALT: 15 U/L (ref 0–44)
AST: 18 U/L (ref 15–41)
Albumin: 3.4 g/dL — ABNORMAL LOW (ref 3.5–5.0)
Alkaline Phosphatase: 49 U/L (ref 38–126)
Anion gap: 14 (ref 5–15)
BUN: 8 mg/dL (ref 8–23)
CO2: 23 mmol/L (ref 22–32)
Calcium: 8.9 mg/dL (ref 8.9–10.3)
Chloride: 106 mmol/L (ref 98–111)
Creatinine, Ser: 0.83 mg/dL (ref 0.61–1.24)
GFR, Estimated: >60 mL/min (ref >60)
Glucose, Bld: 95 mg/dL (ref 70–99)
Potassium: 3.4 mmol/L — ABNORMAL LOW (ref 3.5–5.1)
Sodium: 143 mmol/L (ref 135–145)
Total Bilirubin: 1.3 mg/dL — ABNORMAL HIGH (ref 0.3–1.2)
Total Protein: 6.6 g/dL (ref 6.5–8.1)

## 2020-04-27 LAB — ETHANOL: Alcohol, Ethyl (B): 179 mg/dL — ABNORMAL HIGH (ref <10)

## 2020-04-27 MED ORDER — IOHEXOL 300 MG/ML  SOLN
100.0000 mL | Freq: Once | INTRAMUSCULAR | Status: AC | PRN
  Administered 2020-04-27: 100 mL via INTRAVENOUS

## 2020-04-27 MED ORDER — FENTANYL CITRATE (PF) 100 MCG/2ML IJ SOLN
50.0000 ug | INTRAMUSCULAR | Status: DC | PRN
  Administered 2020-04-27: 50 ug via INTRAVENOUS
  Filled 2020-04-27: qty 2

## 2020-04-27 MED ORDER — ONDANSETRON HCL 4 MG/2ML IJ SOLN
4.0000 mg | Freq: Once | INTRAMUSCULAR | Status: AC
  Administered 2020-04-27: 4 mg via INTRAVENOUS
  Filled 2020-04-27: qty 2
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
## 2020-04-27 NOTE — ED Notes (Signed)
Please call patients wife joyce when he goes for x-rays.
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
## 2020-04-27 NOTE — ED Notes (Signed)
Patient transported to CT 

## 2020-05-01 ENCOUNTER — Ambulatory Visit: Payer: Self-pay

## 2020-05-01 ENCOUNTER — Encounter: Payer: Self-pay | Admitting: Orthopedic Surgery

## 2020-05-01 ENCOUNTER — Other Ambulatory Visit: Payer: Self-pay

## 2020-05-01 ENCOUNTER — Ambulatory Visit (INDEPENDENT_AMBULATORY_CARE_PROVIDER_SITE_OTHER): Payer: No Typology Code available for payment source | Admitting: Orthopedic Surgery

## 2020-05-01 DIAGNOSIS — M25561 Pain in right knee: Secondary | ICD-10-CM | POA: Diagnosis not present

## 2020-05-01 NOTE — Progress Notes (Signed)
Office Visit Note   Patient: Bradley French           Date of Birth: 04-12-1941           MRN: [id number] Visit Date: 05/01/2020 Requested by: No referring provider defined for this encounter. PCP: [facility]  Subjective: Chief Complaint  Patient presents with  . Right Knee - Pain    HPI: Bradley French is a 79 year old patient with right knee arthritis.  Patient did not have surgery due to the need for stress test.  That has not been set up yet through the [place].  Still is having continued knee symptoms              ROS: All systems reviewed are negative as they relate to the chief complaint within the history of present illness.  Patient denies  fevers or chills.   Assessment & Plan: Visit Diagnoses:  1. Right knee pain, unspecified chronicity     Plan: Impression is right knee arthritis.  [facility].  Once that is done we can get him set up for knee surgery.  Follow-up at that time.  Follow-Up Instructions: Return if symptoms worsen or fail to improve.   Orders:  No orders of the defined types were placed in this encounter.  No orders of the defined types were placed in this encounter.     Procedures: No procedures performed   Clinical Data: No additional findings.  Objective: Vital Signs: There were no vitals taken for this visit.  Physical Exam:   Constitutional: Patient appears well-developed HEENT:  Head: Normocephalic Eyes:EOM are normal Neck: Normal range of motion Cardiovascular: Normal rate Pulmonary/chest: Effort normal Neurologic: Patient is alert Skin: Skin is warm Psychiatric: Patient has normal mood and affect    Ortho Exam: Ortho exam demonstrates good range of motion of the right knee with intact skin.  No effusion.  Range of motion is 0-1 15 with medial greater than lateral joint line tenderness  Specialty Comments:  No specialty comments available.  Imaging: No results  found.   PMFS History: Patient Active Problem List   Diagnosis Date Noted  . Traumatic arthritis of knee 11/05/2015  . Leg weakness 10/03/2013  . Lower extremity weakness 10/03/2013  . Abnormal MRI, spinal cord 10/03/2013  . OSA on CPAP   . Lower GI bleed 06/20/2012  . DYSLIPIDEMIA 08/06/2007  . TOBACCO ABUSE 08/06/2007  . CORONARY ARTERY DISEASE 08/06/2007  . COPD 08/06/2007  . DIVERTICULOSIS, COLON 08/06/2007  . GASTRIC ULCER, HX OF 08/06/2007  . HEMORRHOIDS, HX OF 08/06/2007  . CORONARY ARTERY BYPASS GRAFT, TWO VESSEL, HX OF 08/06/2007  . Other postprocedural status(V45.89) 08/06/2007   Past Medical History:  Diagnosis Date  . Anxiety   . Benign hematuria June '08   CT abdomen negative; cystoscopy - negative (at [facility])  . Cataract   . Colon polyps ???   none on 2006 colonoscopy  . COPD (chronic obstructive pulmonary disease) ([place])   . Coronary artery disease   . Depression    PHQ-9 positive at [facility] - score of 4-6  . Diabetes mellitus without complication ([place])   . Diverticulosis of colon   . Gastric ulcer 2006   ? secondary to H Pylori, vs ASA.   Marland Kitchen GERD (gastroesophageal reflux disease)   . H pylori ulcer 2006   treated with Prevpac  . Hemorrhoid   . Hypercholesteremia   . Hypertension   .  Mental disorder   . OA (osteoarthritis) of knee    left knee: tricompartmental deterioration. s/p steroid injections  . OSA on CPAP    diagnosed at [place] in [facility] History  Problem Relation Age of Onset  . Diabetes Brother   . Alcohol abuse Father   . Alcohol abuse Sister   . Diabetes Brother   . Alcohol abuse Brother   . Cancer Neg Hx   . Heart disease Neg Hx     Past Surgical History:  Procedure Laterality Date  . BACK SURGERY    . CARDIAC CATHETERIZATION    . CORONARY ARTERY BYPASS GRAFT  2006   2 vessell  . hemilaminectomy  June '12   L4-5 level due to claudication  . [place]  . KNEE SURGERY     left knee  . STERNAL WIRE REMOVAL  '09    . 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 Occupation: Editor, commissioning    Comment: retired  Tobacco Use  . Smoking status: Current Every Day Smoker    Packs/day: 0.25    Years: 45.00    Pack years: 11.25    Types: Cigarettes  . Smokeless tobacco: Never Used  Substance and Sexual Activity  . Alcohol use: Yes    Alcohol/week: 6.0 standard drinks    Types: 6 Standard drinks or equivalent per week    Comment: brandy   . Drug use: No  . Sexual activity: Never

## 2020-06-10 ENCOUNTER — Emergency Department (HOSPITAL_COMMUNITY)
Admission: EM | Admit: 2020-06-10 | Discharge: 2020-06-10 | Disposition: A | Discharge status: Home / Self Care | Payer: No Typology Code available for payment source | Attending: Emergency Medicine | Admitting: Emergency Medicine

## 2020-06-10 ENCOUNTER — Emergency Department (HOSPITAL_COMMUNITY): Payer: No Typology Code available for payment source

## 2020-06-10 DIAGNOSIS — I2510 Atherosclerotic heart disease of native coronary artery without angina pectoris: Secondary | ICD-10-CM | POA: Diagnosis not present

## 2020-06-10 DIAGNOSIS — F17210 Nicotine dependence, cigarettes, uncomplicated: Secondary | ICD-10-CM | POA: Diagnosis not present

## 2020-06-10 DIAGNOSIS — R509 Fever, unspecified: Secondary | ICD-10-CM | POA: Diagnosis present

## 2020-06-10 DIAGNOSIS — U071 COVID-19: Secondary | ICD-10-CM | POA: Diagnosis not present

## 2020-06-10 DIAGNOSIS — Z96652 Presence of left artificial knee joint: Secondary | ICD-10-CM | POA: Insufficient documentation

## 2020-06-10 DIAGNOSIS — Z79899 Other long term (current) drug therapy: Secondary | ICD-10-CM | POA: Diagnosis not present

## 2020-06-10 DIAGNOSIS — Z7982 Long term (current) use of aspirin: Secondary | ICD-10-CM | POA: Insufficient documentation

## 2020-06-10 DIAGNOSIS — E119 Type 2 diabetes mellitus without complications: Secondary | ICD-10-CM | POA: Insufficient documentation

## 2020-06-10 DIAGNOSIS — J449 Chronic obstructive pulmonary disease, unspecified: Secondary | ICD-10-CM | POA: Diagnosis not present

## 2020-06-10 DIAGNOSIS — Z7984 Long term (current) use of oral hypoglycemic drugs: Secondary | ICD-10-CM | POA: Diagnosis not present

## 2020-06-10 DIAGNOSIS — Z951 Presence of aortocoronary bypass graft: Secondary | ICD-10-CM | POA: Diagnosis not present

## 2020-06-10 DIAGNOSIS — I10 Essential (primary) hypertension: Secondary | ICD-10-CM | POA: Insufficient documentation

## 2020-06-10 LAB — POC SARS CORONAVIRUS 2 AG -  ED: SARS Coronavirus 2 Ag: POSITIVE — AB

## 2020-06-10 NOTE — ED Provider Notes (Signed)
[facility] EMERGENCY DEPARTMENT Provider Note   CSN: [id number] Arrival date & time: 06/10/20  1017     History No chief complaint on file.   Bradley French is a 80 y.o. male.  HPI   Patient with significant medical history of COPD, CAD, hypertension, presents to the emergency department with chief complaint of URI-like symptoms.  Patient endorses since Friday he has been experiencing nasal congestion, fevers and chills, productive cough, loss of taste and smell, loss of appetite and some fatigue.  He denies chest pain, worsening shortness of breath, abdominal pain, nausea, vomiting, diarrhea, and is tolerating p.o. with difficulty.  He is currently vaccinated against COVID-19, denies recent sick contacts, is not immunocompromise.  He denies alleviating factors at this time.  Past Medical History:  Diagnosis Date  . Anxiety   . Benign hematuria June '08   CT abdomen negative; cystoscopy - negative (at [facility])  . Cataract   . Colon polyps ???   none on 2006 colonoscopy  . COPD (chronic obstructive pulmonary disease) ([place])   . Coronary artery disease   . Depression    PHQ-9 positive at [facility] - score of 4-6  . Diabetes mellitus without complication ([place])   . Diverticulosis of colon   . Gastric ulcer 2006   ? secondary to H Pylori, vs ASA.   Marland Kitchen GERD (gastroesophageal reflux disease)   . H pylori ulcer 2006   treated with Prevpac  . Hemorrhoid   . Hypercholesteremia   . Hypertension   . Mental disorder   . OA (osteoarthritis) of knee    left knee: tricompartmental deterioration. s/p steroid injections  . OSA on CPAP    diagnosed at [facility] in [facility]    Patient Active Problem List   Diagnosis Date Noted  . Traumatic arthritis of knee 11/05/2015  . Leg weakness 10/03/2013  . Lower extremity weakness 10/03/2013  . Abnormal MRI, spinal cord 10/03/2013  . OSA on CPAP   . Lower GI bleed 06/20/2012  . DYSLIPIDEMIA 08/06/2007  . TOBACCO ABUSE 08/06/2007  . CORONARY  ARTERY DISEASE 08/06/2007  . COPD 08/06/2007  . DIVERTICULOSIS, COLON 08/06/2007  . GASTRIC ULCER, HX OF 08/06/2007  . HEMORRHOIDS, HX OF 08/06/2007  . CORONARY ARTERY BYPASS GRAFT, TWO VESSEL, HX OF 08/06/2007  . Other postprocedural status(V45.89) 08/06/2007    Past Surgical History:  Procedure Laterality Date  . BACK SURGERY    . CARDIAC CATHETERIZATION    . CORONARY ARTERY BYPASS GRAFT  2006   2 vessell  . hemilaminectomy  June '12   L4-5 level due to claudication  . [place]  . KNEE SURGERY     left knee  . STERNAL WIRE REMOVAL  '09  . 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 Diabetes Brother   . Alcohol abuse Father   . Alcohol abuse Sister   . Diabetes Brother   . Alcohol abuse Brother   . Cancer Neg Hx   . Heart disease Neg Hx     Social History   Tobacco Use  . Smoking status: Current Every Day Smoker    Packs/day: 0.25    Years: 45.00    Pack years: 11.25    Types: Cigarettes  . Smokeless tobacco: Never Used  Substance Use  Topics  . Alcohol use: Yes    Alcohol/week: 6.0 standard drinks    Types: 6 Standard drinks or equivalent per week    Comment: brandy   . Drug use: No    Home Medications Prior to Admission medications   Medication Sig Start Date End Date Taking? Authorizing Provider  amLODipine (NORVASC) 10 MG tablet Take 1 tablet (10 mg total) by mouth daily. 10/07/13   Thurnell Lose, MD  aspirin EC 81 MG tablet Take 81 mg by mouth daily.    [provider]  Aspirin-Caffeine (BC FAST PAIN RELIEF ARTHRITIS) 1000-65 MG PACK Take 1 Package by mouth in the morning and at bedtime.    [provider]  diclofenac Sodium (VOLTAREN) 1 % GEL Apply topically daily.    [provider]  furosemide (LASIX) 20 MG tablet Take 1 tablet  (20 mg total) by mouth daily. 12/21/18   Quintella Reichert, MD  Ipratropium-Albuterol (COMBIVENT RESPIMAT IN) Inhale 1 puff into the lungs 4 (four) times daily as needed.    [provider]  losartan (COZAAR) 50 MG tablet Take 25 mg by mouth daily.    [provider]  metFORMIN (GLUCOPHAGE) 500 MG tablet Take 500 mg by mouth daily after breakfast.    [provider]  metoprolol tartrate (LOPRESSOR) 50 MG tablet Take 50 mg by mouth 2 (two) times daily.    [provider]  potassium chloride SA (KLOR-CON) 20 MEQ tablet Take 2 tablets (40 mEq total) by mouth 2 (two) times daily. 04/12/20   Magnant, Gerrianne Scale, PA-C    Allergies    Sulfonamide derivatives  Review of Systems   Review of Systems  Constitutional: Positive for appetite change, chills, fatigue and fever.  HENT: Negative for congestion and sore throat.   Respiratory: Positive for cough. Negative for shortness of breath.   Cardiovascular: Negative for chest pain.  Gastrointestinal: Negative for abdominal pain, diarrhea, nausea and vomiting.  Genitourinary: Negative for enuresis, flank pain and frequency.  Musculoskeletal: Negative for back pain.  Skin: Negative for rash.  Neurological: Negative for headaches.  Hematological: Does not bruise/bleed easily.    Physical Exam Updated Vital Signs BP 137/72   Pulse 89   Temp (!) 101.4 F (38.6 C) (Oral)   Resp 13   Ht 6\' 4"  (1.93 m)   Wt 108.9 kg   SpO2 94%   BMI 29.21 kg/m   Physical Exam Vitals and nursing note reviewed.  Constitutional:      General: He is not in acute distress.    Appearance: He is not ill-appearing.  HENT:     Head: Normocephalic and atraumatic.     Nose: No congestion.     Mouth/Throat:     Mouth: Mucous membranes are moist.     Pharynx: Oropharynx is clear. No oropharyngeal exudate or posterior oropharyngeal erythema.  Eyes:     Conjunctiva/sclera: Conjunctivae normal.  Cardiovascular:     Rate and Rhythm:  Normal rate and regular rhythm.     Pulses: Normal pulses.     Heart sounds: No murmur heard. No friction rub. No gallop.   Pulmonary:     Effort: No respiratory distress.     Breath sounds: No wheezing, rhonchi or rales.  Abdominal:     Palpations: Abdomen is soft.     Tenderness: There is no abdominal tenderness.  Musculoskeletal:     Comments: Patient is moving all 4 extremities out difficulty.  Skin:    General: Skin is  warm and dry.  Neurological:     Mental Status: He is alert.  Psychiatric:        Mood and Affect: Mood normal.     ED Results / Procedures / Treatments   Labs (all labs ordered are listed, but only abnormal results are displayed) Labs Reviewed  POC SARS CORONAVIRUS 2 AG -  ED - Abnormal; Notable for the following components:      Result Value   SARS Coronavirus 2 Ag POSITIVE (*)    All other components within normal limits    EKG None  Radiology DG Chest Portable 1 View  Result Date: 06/10/2020 CLINICAL DATA:  Cough and fever for 3-4 days. EXAM: PORTABLE CHEST 1 VIEW COMPARISON:  PA and lateral chest 12/21/2018. FINDINGS: The patient is status post CABG. Lungs are clear. Heart size is upper normal. No pneumothorax or pleural fluid. No acute or focal bony abnormality. IMPRESSION: No acute disease. Electronically Signed   By: Inge Rise M.D.   On: 06/10/2020 11:36    Procedures Procedures (including critical care time)  Medications Ordered in ED Medications - No data to display  ED Course  I have reviewed the triage vital signs and the nursing notes.  Pertinent labs & imaging results that were available during my care of the patient were reviewed by me and considered in my medical decision making (see chart for details).    MDM Rules/Calculators/A&P                          Patient presents with URI-like symptoms.  He is alert, does not appear to be in acute distress, vital signs reassuring.  Will obtain COVID test, chest x-ray and  ambulate patient.  COVID test is positive, chest x-ray does not reveal acute findings.  EKG sinus without signs of ischemia no ST elevation depression noted.  Patient ambulating O2 sat stay above 91% denies worsening shortness of breath or chest pain.  Low suspicion for systemic infection as patient is nontoxic-appearing, vital signs reassuring, no obvious source infection noted on exam.  Low suspicion for pneumonia as lung sounds are clear bilaterally, x-ray did not reveal any acute findings.  I have low suspicion for PE as patient denies pleuritic chest pain, shortness of breath, vital signs are reassuring. low suspicion for strep throat as oropharynx was visualized, no erythema or exudates noted.  Low suspicion patient would need  hospitalized due to  Covid as vital signs reassuring, patient is not in respiratory distress.  Due to patient's significant comorbidities, patient qualifies for infusion clinic.  will contact infusion clinic and have him follow-up with them for further treatment.  Patient aslo instructed to follow-up with "post-COVID" care.  Vital signs have remained stable, no indication for hospital admission.  Patient discussed with attending and they agreed with assessment and plan.  Patient given at home care as well strict return precautions.  Patient verbalized that they understood agreed to said plan.    Final Clinical Impression(s) / ED Diagnoses Final diagnoses:  COVID    Rx / DC Orders ED Discharge Orders    None       Marcello Fennel, PA-C 06/10/20 1722    Davonna Belling, MD 06/10/20 [phone]

## 2020-06-10 NOTE — ED Notes (Signed)
Pt reports lack of appetite and SOB

## 2020-06-10 NOTE — ED Triage Notes (Signed)
Pt here with c/o no taste or smell and cough along with fever , pt is vaccinated but did not get the booster

## 2020-06-10 NOTE — ED Notes (Signed)
Pt O2 sats resting in bed are 93%. Pt ambulated in room on RA and sats stayed above 91%. Pt reports no more than normal amount of SOB when walking.

## 2020-06-10 NOTE — Discharge Instructions (Addendum)
You have been seen here for URI like symptoms.  I recommend taking Tylenol for fever control and ibuprofen for pain control please follow dosing on the back of bottle.  I recommend staying hydrated and if you do not an appetite, I recommend soups as this will provide you with fluids and calories.    you are Covid positive you must self quarantine for 5 days starting on symptom onset, if at the end of those 5 days you are feeling better you may return back to school/work, if you continue to have symptoms you must self quarantine for additional 5 days.  I would like you to contact "post Covid care" as they will provide you with information how to manage your Covid symptoms Come back to the emergency department if you develop chest pain, shortness of breath, severe abdominal pain, uncontrolled nausea, vomiting, diarrhea.  

## 2020-06-14 ENCOUNTER — Emergency Department (HOSPITAL_COMMUNITY): Payer: No Typology Code available for payment source

## 2020-06-14 ENCOUNTER — Inpatient Hospital Stay (HOSPITAL_COMMUNITY)
Admission: EM | Admit: 2020-06-14 | Discharge: 2020-07-30 | DRG: 177 | Disposition: E | Payer: No Typology Code available for payment source | Attending: Internal Medicine | Admitting: Internal Medicine

## 2020-06-14 ENCOUNTER — Telehealth: Payer: No Typology Code available for payment source

## 2020-06-14 ENCOUNTER — Other Ambulatory Visit: Payer: Self-pay

## 2020-06-14 ENCOUNTER — Encounter (HOSPITAL_COMMUNITY): Payer: Self-pay

## 2020-06-14 DIAGNOSIS — I2510 Atherosclerotic heart disease of native coronary artery without angina pectoris: Secondary | ICD-10-CM | POA: Diagnosis present

## 2020-06-14 DIAGNOSIS — J690 Pneumonitis due to inhalation of food and vomit: Secondary | ICD-10-CM | POA: Diagnosis not present

## 2020-06-14 DIAGNOSIS — E785 Hyperlipidemia, unspecified: Secondary | ICD-10-CM | POA: Diagnosis present

## 2020-06-14 DIAGNOSIS — J1282 Pneumonia due to coronavirus disease 2019: Secondary | ICD-10-CM | POA: Diagnosis present

## 2020-06-14 DIAGNOSIS — K224 Dyskinesia of esophagus: Secondary | ICD-10-CM | POA: Diagnosis present

## 2020-06-14 DIAGNOSIS — J441 Chronic obstructive pulmonary disease with (acute) exacerbation: Secondary | ICD-10-CM | POA: Diagnosis present

## 2020-06-14 DIAGNOSIS — Z6829 Body mass index (BMI) 29.0-29.9, adult: Secondary | ICD-10-CM | POA: Diagnosis not present

## 2020-06-14 DIAGNOSIS — Z833 Family history of diabetes mellitus: Secondary | ICD-10-CM

## 2020-06-14 DIAGNOSIS — Y92239 Unspecified place in hospital as the place of occurrence of the external cause: Secondary | ICD-10-CM | POA: Diagnosis present

## 2020-06-14 DIAGNOSIS — F17210 Nicotine dependence, cigarettes, uncomplicated: Secondary | ICD-10-CM | POA: Diagnosis present

## 2020-06-14 DIAGNOSIS — Z7984 Long term (current) use of oral hypoglycemic drugs: Secondary | ICD-10-CM

## 2020-06-14 DIAGNOSIS — Y95 Nosocomial condition: Secondary | ICD-10-CM | POA: Diagnosis not present

## 2020-06-14 DIAGNOSIS — R627 Adult failure to thrive: Secondary | ICD-10-CM | POA: Diagnosis present

## 2020-06-14 DIAGNOSIS — Z66 Do not resuscitate: Secondary | ICD-10-CM | POA: Diagnosis not present

## 2020-06-14 DIAGNOSIS — Z7982 Long term (current) use of aspirin: Secondary | ICD-10-CM

## 2020-06-14 DIAGNOSIS — K219 Gastro-esophageal reflux disease without esophagitis: Secondary | ICD-10-CM | POA: Diagnosis present

## 2020-06-14 DIAGNOSIS — F419 Anxiety disorder, unspecified: Secondary | ICD-10-CM | POA: Diagnosis present

## 2020-06-14 DIAGNOSIS — E8809 Other disorders of plasma-protein metabolism, not elsewhere classified: Secondary | ICD-10-CM | POA: Diagnosis present

## 2020-06-14 DIAGNOSIS — J982 Interstitial emphysema: Secondary | ICD-10-CM | POA: Diagnosis present

## 2020-06-14 DIAGNOSIS — Z515 Encounter for palliative care: Secondary | ICD-10-CM | POA: Diagnosis not present

## 2020-06-14 DIAGNOSIS — J9601 Acute respiratory failure with hypoxia: Secondary | ICD-10-CM | POA: Diagnosis not present

## 2020-06-14 DIAGNOSIS — G4733 Obstructive sleep apnea (adult) (pediatric): Secondary | ICD-10-CM | POA: Diagnosis present

## 2020-06-14 DIAGNOSIS — E7800 Pure hypercholesterolemia, unspecified: Secondary | ICD-10-CM | POA: Diagnosis present

## 2020-06-14 DIAGNOSIS — D696 Thrombocytopenia, unspecified: Secondary | ICD-10-CM | POA: Diagnosis present

## 2020-06-14 DIAGNOSIS — Z951 Presence of aortocoronary bypass graft: Secondary | ICD-10-CM

## 2020-06-14 DIAGNOSIS — E1151 Type 2 diabetes mellitus with diabetic peripheral angiopathy without gangrene: Secondary | ICD-10-CM | POA: Diagnosis present

## 2020-06-14 DIAGNOSIS — I10 Essential (primary) hypertension: Secondary | ICD-10-CM | POA: Diagnosis present

## 2020-06-14 DIAGNOSIS — T380X5A Adverse effect of glucocorticoids and synthetic analogues, initial encounter: Secondary | ICD-10-CM | POA: Diagnosis present

## 2020-06-14 DIAGNOSIS — R0603 Acute respiratory distress: Secondary | ICD-10-CM | POA: Diagnosis not present

## 2020-06-14 DIAGNOSIS — R0600 Dyspnea, unspecified: Secondary | ICD-10-CM | POA: Diagnosis not present

## 2020-06-14 DIAGNOSIS — I469 Cardiac arrest, cause unspecified: Secondary | ICD-10-CM | POA: Diagnosis not present

## 2020-06-14 DIAGNOSIS — F32A Depression, unspecified: Secondary | ICD-10-CM | POA: Diagnosis present

## 2020-06-14 DIAGNOSIS — Z96652 Presence of left artificial knee joint: Secondary | ICD-10-CM | POA: Diagnosis present

## 2020-06-14 DIAGNOSIS — Z79899 Other long term (current) drug therapy: Secondary | ICD-10-CM

## 2020-06-14 DIAGNOSIS — J80 Acute respiratory distress syndrome: Secondary | ICD-10-CM | POA: Diagnosis not present

## 2020-06-14 DIAGNOSIS — E860 Dehydration: Secondary | ICD-10-CM | POA: Diagnosis present

## 2020-06-14 DIAGNOSIS — R0902 Hypoxemia: Secondary | ICD-10-CM

## 2020-06-14 DIAGNOSIS — R630 Anorexia: Secondary | ICD-10-CM | POA: Diagnosis present

## 2020-06-14 DIAGNOSIS — U071 COVID-19: Principal | ICD-10-CM

## 2020-06-14 DIAGNOSIS — Z882 Allergy status to sulfonamides status: Secondary | ICD-10-CM

## 2020-06-14 DIAGNOSIS — J440 Chronic obstructive pulmonary disease with acute lower respiratory infection: Secondary | ICD-10-CM | POA: Diagnosis present

## 2020-06-14 DIAGNOSIS — E876 Hypokalemia: Secondary | ICD-10-CM | POA: Diagnosis present

## 2020-06-14 LAB — TRIGLYCERIDES: Triglycerides: 135 mg/dL (ref <150)

## 2020-06-14 LAB — COMPREHENSIVE METABOLIC PANEL
ALT: 35 U/L (ref 0–44)
AST: 77 U/L — ABNORMAL HIGH (ref 15–41)
Albumin: 3.1 g/dL — ABNORMAL LOW (ref 3.5–5.0)
Alkaline Phosphatase: 49 U/L (ref 38–126)
Anion gap: 16 — ABNORMAL HIGH (ref 5–15)
BUN: 9 mg/dL (ref 8–23)
CO2: 21 mmol/L — ABNORMAL LOW (ref 22–32)
Calcium: 8.2 mg/dL — ABNORMAL LOW (ref 8.9–10.3)
Chloride: 99 mmol/L (ref 98–111)
Creatinine, Ser: 1.15 mg/dL (ref 0.61–1.24)
GFR, Estimated: >60 mL/min (ref >60)
Glucose, Bld: 96 mg/dL (ref 70–99)
Potassium: 3.3 mmol/L — ABNORMAL LOW (ref 3.5–5.1)
Sodium: 136 mmol/L (ref 135–145)
Total Bilirubin: 1.6 mg/dL — ABNORMAL HIGH (ref 0.3–1.2)
Total Protein: 6.8 g/dL (ref 6.5–8.1)

## 2020-06-14 LAB — CBC
HCT: 50.7 % (ref 39.0–52.0)
Hemoglobin: 16.7 g/dL (ref 13.0–17.0)
MCH: 29.9 pg (ref 26.0–34.0)
MCHC: 32.9 g/dL (ref 30.0–36.0)
MCV: 90.7 fL (ref 80.0–100.0)
Platelets: 138 10*3/uL — ABNORMAL LOW (ref 150–400)
RBC: 5.59 MIL/uL (ref 4.22–5.81)
RDW: 13.4 % (ref 11.5–15.5)
WBC: 5.6 10*3/uL (ref 4.0–10.5)
nRBC: 0 % (ref 0.0–0.2)

## 2020-06-14 LAB — FIBRINOGEN: Fibrinogen: 651 mg/dL — ABNORMAL HIGH (ref 210–475)

## 2020-06-14 LAB — C-REACTIVE PROTEIN: CRP: 15.6 mg/dL — ABNORMAL HIGH (ref <1.0)

## 2020-06-14 LAB — D-DIMER, QUANTITATIVE: D-Dimer, Quant: 1.08 ug/mL-FEU — ABNORMAL HIGH (ref 0.00–0.50)

## 2020-06-14 LAB — PROCALCITONIN: Procalcitonin: 0.22 ng/mL

## 2020-06-14 LAB — LACTIC ACID, PLASMA: Lactic Acid, Venous: 1.6 mmol/L (ref 0.5–1.9)

## 2020-06-14 LAB — TROPONIN I (HIGH SENSITIVITY): Troponin I (High Sensitivity): 20 ng/L — ABNORMAL HIGH (ref <18)

## 2020-06-14 LAB — CBG MONITORING, ED: Glucose-Capillary: 117 mg/dL — ABNORMAL HIGH (ref 70–99)

## 2020-06-14 LAB — BRAIN NATRIURETIC PEPTIDE: B Natriuretic Peptide: 34.8 pg/mL (ref 0.0–100.0)

## 2020-06-14 LAB — MAGNESIUM: Magnesium: 2.2 mg/dL (ref 1.7–2.4)

## 2020-06-14 LAB — FERRITIN: Ferritin: 1883 ng/mL — ABNORMAL HIGH (ref 24–336)

## 2020-06-14 LAB — LACTATE DEHYDROGENASE: LDH: 403 U/L — ABNORMAL HIGH (ref 98–192)

## 2020-06-14 MED ORDER — POTASSIUM CHLORIDE CRYS ER 20 MEQ PO TBCR
40.0000 meq | EXTENDED_RELEASE_TABLET | Freq: Once | ORAL | Status: AC
  Administered 2020-06-14: 40 meq via ORAL
  Filled 2020-06-14: qty 2

## 2020-06-14 MED ORDER — METHYLPREDNISOLONE SODIUM SUCC 125 MG IJ SOLR
1.0000 mg/kg | Freq: Two times a day (BID) | INTRAMUSCULAR | Status: DC
  Administered 2020-06-14: 108.75 mg via INTRAVENOUS
  Filled 2020-06-14: qty 2

## 2020-06-14 MED ORDER — METOPROLOL TARTRATE 50 MG PO TABS
50.0000 mg | ORAL_TABLET | Freq: Two times a day (BID) | ORAL | Status: DC
  Administered 2020-06-14 – 2020-06-24 (×17): 50 mg via ORAL
  Filled 2020-06-14: qty 2
  Filled 2020-06-14 (×3): qty 1
  Filled 2020-06-14 (×2): qty 2
  Filled 2020-06-14 (×5): qty 1
  Filled 2020-06-14: qty 2
  Filled 2020-06-14 (×2): qty 1
  Filled 2020-06-14: qty 2
  Filled 2020-06-14 (×2): qty 1
  Filled 2020-06-14: qty 2
  Filled 2020-06-14 (×3): qty 1

## 2020-06-14 MED ORDER — BENZONATATE 100 MG PO CAPS
100.0000 mg | ORAL_CAPSULE | Freq: Once | ORAL | Status: AC
  Administered 2020-06-14: 100 mg via ORAL
  Filled 2020-06-14: qty 1

## 2020-06-14 MED ORDER — SODIUM CHLORIDE 0.9 % IV SOLN: INTRAVENOUS | Status: AC

## 2020-06-14 MED ORDER — SODIUM CHLORIDE 0.9 % IV SOLN
200.0000 mg | Freq: Once | INTRAVENOUS | Status: AC
  Administered 2020-06-14: 200 mg via INTRAVENOUS
  Filled 2020-06-14: qty 40

## 2020-06-14 MED ORDER — ASPIRIN EC 81 MG PO TBEC
81.0000 mg | DELAYED_RELEASE_TABLET | Freq: Every day | ORAL | Status: DC
  Administered 2020-06-14 – 2020-07-09 (×26): 81 mg via ORAL
  Filled 2020-06-14 (×26): qty 1

## 2020-06-14 MED ORDER — FUROSEMIDE 20 MG PO TABS: 20.0000 mg | ORAL_TABLET | Freq: Every day | ORAL | Status: DC

## 2020-06-14 MED ORDER — INSULIN ASPART 100 UNIT/ML ~~LOC~~ SOLN
0.0000 [IU] | Freq: Three times a day (TID) | SUBCUTANEOUS | Status: DC
  Administered 2020-06-15 (×3): 3 [IU] via SUBCUTANEOUS
  Administered 2020-06-16: 5 [IU] via SUBCUTANEOUS
  Administered 2020-06-16 (×2): 3 [IU] via SUBCUTANEOUS
  Administered 2020-06-17 – 2020-06-18 (×3): 5 [IU] via SUBCUTANEOUS
  Administered 2020-06-18: 8 [IU] via SUBCUTANEOUS
  Administered 2020-06-18: 5 [IU] via SUBCUTANEOUS
  Administered 2020-06-19: 3 [IU] via SUBCUTANEOUS
  Administered 2020-06-19: 7 [IU] via SUBCUTANEOUS
  Administered 2020-06-19 – 2020-06-20 (×2): 5 [IU] via SUBCUTANEOUS
  Administered 2020-06-20: 11 [IU] via SUBCUTANEOUS
  Administered 2020-06-20: 3 [IU] via SUBCUTANEOUS
  Administered 2020-06-21 (×3): 5 [IU] via SUBCUTANEOUS
  Administered 2020-06-22: 11 [IU] via SUBCUTANEOUS
  Administered 2020-06-22: 8 [IU] via SUBCUTANEOUS
  Administered 2020-06-22: 5 [IU] via SUBCUTANEOUS
  Administered 2020-06-23: 8 [IU] via SUBCUTANEOUS
  Administered 2020-06-23: 11 [IU] via SUBCUTANEOUS
  Administered 2020-06-23 – 2020-06-24 (×2): 15 [IU] via SUBCUTANEOUS
  Administered 2020-06-24: 11 [IU] via SUBCUTANEOUS
  Administered 2020-06-24: 8 [IU] via SUBCUTANEOUS
  Administered 2020-06-25 (×2): 5 [IU] via SUBCUTANEOUS

## 2020-06-14 MED ORDER — ENOXAPARIN SODIUM 40 MG/0.4ML ~~LOC~~ SOLN
40.0000 mg | SUBCUTANEOUS | Status: DC
  Administered 2020-06-14 – 2020-06-20 (×7): 40 mg via SUBCUTANEOUS
  Filled 2020-06-14 (×7): qty 0.4

## 2020-06-14 MED ORDER — DICLOFENAC SODIUM 1 % EX GEL
2.0000 g | Freq: Every day | CUTANEOUS | Status: DC
  Administered 2020-06-14 – 2020-07-10 (×14): 2 g via TOPICAL
  Filled 2020-06-14 (×2): qty 100

## 2020-06-14 MED ORDER — PREDNISONE 20 MG PO TABS: 50.0000 mg | ORAL_TABLET | Freq: Every day | ORAL | Status: DC

## 2020-06-14 MED ORDER — ACETAMINOPHEN 325 MG PO TABS
650.0000 mg | ORAL_TABLET | Freq: Four times a day (QID) | ORAL | Status: DC | PRN
  Administered 2020-06-18 – 2020-06-29 (×5): 650 mg via ORAL
  Filled 2020-06-14 (×5): qty 2

## 2020-06-14 MED ORDER — ENSURE ENLIVE PO LIQD
237.0000 mL | Freq: Two times a day (BID) | ORAL | Status: DC
  Administered 2020-06-16 – 2020-06-27 (×22): 237 mL via ORAL
  Filled 2020-06-14 (×4): qty 237

## 2020-06-14 MED ORDER — AMLODIPINE BESYLATE 10 MG PO TABS
10.0000 mg | ORAL_TABLET | Freq: Every day | ORAL | Status: DC
  Administered 2020-06-14 – 2020-07-09 (×24): 10 mg via ORAL
  Filled 2020-06-14 (×14): qty 1
  Filled 2020-06-14 (×2): qty 2
  Filled 2020-06-14: qty 1
  Filled 2020-06-14: qty 2
  Filled 2020-06-14 (×7): qty 1

## 2020-06-14 MED ORDER — LACTATED RINGERS IV BOLUS
1000.0000 mL | Freq: Once | INTRAVENOUS | Status: AC
  Administered 2020-06-14: 1000 mL via INTRAVENOUS

## 2020-06-14 MED ORDER — IPRATROPIUM-ALBUTEROL 20-100 MCG/ACT IN AERS
1.0000 | INHALATION_SPRAY | Freq: Four times a day (QID) | RESPIRATORY_TRACT | Status: DC
  Administered 2020-06-14 – 2020-06-18 (×15): 1 via RESPIRATORY_TRACT
  Filled 2020-06-14: qty 4

## 2020-06-14 MED ORDER — SODIUM CHLORIDE 0.9 % IV SOLN
100.0000 mg | Freq: Every day | INTRAVENOUS | Status: AC
  Administered 2020-06-15 – 2020-06-18 (×3): 100 mg via INTRAVENOUS
  Filled 2020-06-14 (×3): qty 20

## 2020-06-14 MED ORDER — DRONABINOL 2.5 MG PO CAPS
2.5000 mg | ORAL_CAPSULE | Freq: Two times a day (BID) | ORAL | Status: DC
Start: 2020-06-15 — End: 2020-07-10
  Administered 2020-06-15 – 2020-07-09 (×47): 2.5 mg via ORAL
  Filled 2020-06-14 (×46): qty 1

## 2020-06-14 MED ORDER — LOSARTAN POTASSIUM 25 MG PO TABS
25.0000 mg | ORAL_TABLET | Freq: Every day | ORAL | Status: DC
Start: 2020-06-14 — End: 2020-06-24
  Administered 2020-06-14 – 2020-06-23 (×7): 25 mg via ORAL
  Filled 2020-06-14 (×9): qty 1

## 2020-06-14 MED ORDER — GUAIFENESIN-DM 100-10 MG/5ML PO SYRP
10.0000 mL | ORAL_SOLUTION | ORAL | Status: DC | PRN
  Administered 2020-06-17 – 2020-07-02 (×14): 10 mL via ORAL
  Filled 2020-06-14 (×14): qty 10

## 2020-06-14 MED ORDER — ONDANSETRON HCL 4 MG/2ML IJ SOLN: 4.0000 mg | Freq: Four times a day (QID) | INTRAMUSCULAR | Status: DC | PRN

## 2020-06-14 MED ORDER — ONDANSETRON HCL 4 MG PO TABS: 4.0000 mg | ORAL_TABLET | Freq: Four times a day (QID) | ORAL | Status: DC | PRN

## 2020-06-14 MED ORDER — BARICITINIB 2 MG PO TABS
4.0000 mg | ORAL_TABLET | Freq: Every day | ORAL | Status: AC
  Administered 2020-06-14 – 2020-06-27 (×14): 4 mg via ORAL
  Filled 2020-06-14 (×17): qty 2

## 2020-06-14 MED ORDER — DEXAMETHASONE 4 MG PO TABS
6.0000 mg | ORAL_TABLET | Freq: Once | ORAL | Status: AC
  Administered 2020-06-14: 6 mg via ORAL
  Filled 2020-06-14: qty 2

## 2020-06-14 NOTE — ED Notes (Signed)
Bradley French [phone]) called for update on patient's status.  Thank you

## 2020-06-14 NOTE — ED Notes (Signed)
ED Tech informed this RN patient SpO2 down to 88% on 2L [place]. Patient increased to 4L [place], SpO2 up to 92%. Will continue to monitor.

## 2020-06-14 NOTE — ED Provider Notes (Signed)
[place] EMERGENCY DEPARTMENT Provider Note   CSN: [id number] Arrival date & time: 06/15/2020  1027     History Chief Complaint  Patient presents with  . Covid Positive  . Weakness    Bradley French is a 80 y.o. male.  The history is provided by the patient.  Weakness Severity:  Moderate Onset quality:  Gradual Duration:  5 days Timing:  Constant Progression:  Waxing and waning Chronicity:  New Context: dehydration   Relieved by:  Nothing Worsened by:  Nothing Ineffective treatments:  Drinking fluids Associated symptoms: anorexia, cough, nausea, shortness of breath and vomiting   Associated symptoms: no abdominal pain, no arthralgias, no chest pain, no dysuria, no fever and no seizures   Risk factors: coronary artery disease        Past Medical History:  Diagnosis Date  . Anxiety   . Benign hematuria June '08   CT abdomen negative; cystoscopy - negative (at [facility])  . Cataract   . Colon polyps ???   none on 2006 colonoscopy  . COPD (chronic obstructive pulmonary disease) ([place])   . Coronary artery disease   . Depression    PHQ-9 positive at [facility] - score of 4-6  . Diabetes mellitus without complication ([place])   . Diverticulosis of colon   . Gastric ulcer 2006   ? secondary to H Pylori, vs ASA.   Marland Kitchen GERD (gastroesophageal reflux disease)   . H pylori ulcer 2006   treated with Prevpac  . Hemorrhoid   . Hypercholesteremia   . Hypertension   . Mental disorder   . OA (osteoarthritis) of knee    left knee: tricompartmental deterioration. s/p steroid injections  . OSA on CPAP    diagnosed at [facility] in [facility]    Patient Active Problem List   Diagnosis Date Noted  . COVID-19 virus infection 06/05/2020  . COVID-19 06/18/2020  . Traumatic arthritis of knee 11/05/2015  . Leg weakness 10/03/2013  . Lower extremity weakness 10/03/2013  . Abnormal MRI, spinal cord 10/03/2013  . OSA on CPAP   . Lower GI bleed 06/20/2012  . DYSLIPIDEMIA 08/06/2007  .  TOBACCO ABUSE 08/06/2007  . CORONARY ARTERY DISEASE 08/06/2007  . COPD 08/06/2007  . DIVERTICULOSIS, COLON 08/06/2007  . GASTRIC ULCER, HX OF 08/06/2007  . HEMORRHOIDS, HX OF 08/06/2007  . CORONARY ARTERY BYPASS GRAFT, TWO VESSEL, HX OF 08/06/2007  . Other postprocedural status(V45.89) 08/06/2007    Past Surgical History:  Procedure Laterality Date  . BACK SURGERY    . CARDIAC CATHETERIZATION    . CORONARY ARTERY BYPASS GRAFT  2006   2 vessell  . hemilaminectomy  June '12   L4-5 level due to claudication  . [place]  . KNEE SURGERY     left knee  . STERNAL WIRE REMOVAL  '09  . 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 Diabetes Brother   . Alcohol abuse Father   . Alcohol abuse Sister   . Diabetes Brother   . Alcohol abuse Brother   . Cancer Neg Hx   . Heart disease Neg Hx     Social History   Tobacco Use  . Smoking status: Current Every Day Smoker    Packs/day: 0.25    Years: 45.00  Pack years: 11.25    Types: Cigarettes  . Smokeless tobacco: Never Used  Substance Use Topics  . Alcohol use: Yes    Alcohol/week: 6.0 standard drinks    Types: 6 Standard drinks or equivalent per week    Comment: brandy   . Drug use: No    Home Medications Prior to Admission medications   Medication Sig Start Date End Date Taking? Authorizing Provider  amLODipine (NORVASC) 10 MG tablet Take 1 tablet (10 mg total) by mouth daily. 10/07/13  Yes Thurnell Lose, MD  aspirin EC 81 MG tablet Take 81 mg by mouth daily.   Yes [provider]  Aspirin-Caffeine (BC FAST PAIN RELIEF ARTHRITIS) 1000-65 MG PACK Take 1 Package by mouth in the morning and at bedtime.   Yes [provider]  diclofenac Sodium (VOLTAREN) 1 % GEL Apply 2 g topically daily as needed (For pain).    Yes [provider]  furosemide (LASIX) 20 MG tablet Take 1 tablet (20 mg total) by mouth daily. 12/21/18  Yes Quintella Reichert, MD  Ipratropium-Albuterol (COMBIVENT RESPIMAT IN) Inhale 1 puff into the lungs 4 (four) times daily as needed.   Yes [provider]  losartan (COZAAR) 50 MG tablet Take 25 mg by mouth daily.   Yes [provider]  metFORMIN (GLUCOPHAGE) 500 MG tablet Take 500 mg by mouth daily after breakfast.   Yes [provider]  metoprolol tartrate (LOPRESSOR) 50 MG tablet Take 50 mg by mouth 2 (two) times daily.   Yes [provider]  potassium chloride SA (KLOR-CON) 20 MEQ tablet Take 2 tablets (40 mEq total) by mouth 2 (two) times daily. 04/12/20  Yes Magnant, Charles L, PA-C    Allergies    Sulfonamide derivatives  Review of Systems   Review of Systems  Constitutional: Positive for appetite change. Negative for chills and fever.  HENT: Negative for ear pain and sore throat.   Eyes: Negative for pain and visual disturbance.  Respiratory: Positive for cough and shortness of breath.   Cardiovascular: Negative for chest pain and palpitations.  Gastrointestinal: Positive for anorexia, nausea and vomiting. Negative for abdominal pain.  Genitourinary: Negative for dysuria and hematuria.  Musculoskeletal: Negative for arthralgias and back pain.  Skin: Negative for color change and rash.  Neurological: Positive for weakness. Negative for seizures and syncope.  All other systems reviewed and are negative.   Physical Exam Updated Vital Signs BP 136/69   Pulse 96   Temp 98.6 F (37 C) (Oral)   Resp (!) 29   Ht 6\' 4"  (1.93 m)   Wt 108.9 kg   SpO2 94%   BMI 29.21 kg/m   Physical Exam Vitals and nursing note reviewed.  Constitutional:      Appearance: He is well-developed and well-nourished.  HENT:     Head: Normocephalic and atraumatic.  Eyes:     Conjunctiva/sclera: Conjunctivae normal.  Cardiovascular:     Rate and  Rhythm: Normal rate and regular rhythm.     Heart sounds: No murmur heard.   Pulmonary:     Effort: Tachypnea present. No respiratory distress.     Breath sounds: Rales present.  Abdominal:     Palpations: Abdomen is soft.     Tenderness: There is no abdominal tenderness.  Musculoskeletal:        General: No edema.     Cervical back: Neck supple.  Skin:    General: Skin is warm and dry.  Neurological:  Mental Status: He is alert.  Psychiatric:        Mood and Affect: Mood and affect normal.     ED Results / Procedures / Treatments   Labs (all labs ordered are listed, but only abnormal results are displayed) Labs Reviewed  CBC - Abnormal; Notable for the following components:      Result Value   Platelets 138 (*)    All other components within normal limits  COMPREHENSIVE METABOLIC PANEL - Abnormal; Notable for the following components:   Potassium 3.3 (*)    CO2 21 (*)    Calcium 8.2 (*)    Albumin 3.1 (*)    AST 77 (*)    Total Bilirubin 1.6 (*)    Anion gap 16 (*)    All other components within normal limits  D-DIMER, QUANTITATIVE (NOT AT [facility]) - Abnormal; Notable for the following components:   D-Dimer, Quant 1.08 (*)    All other components within normal limits  LACTATE DEHYDROGENASE - Abnormal; Notable for the following components:   LDH 403 (*)    All other components within normal limits  FERRITIN - Abnormal; Notable for the following components:   Ferritin 1,883 (*)    All other components within normal limits  FIBRINOGEN - Abnormal; Notable for the following components:   Fibrinogen 651 (*)    All other components within normal limits  C-REACTIVE PROTEIN - Abnormal; Notable for the following components:   CRP 15.6 (*)    All other components within normal limits  MAGNESIUM  LACTIC ACID, PLASMA  PROCALCITONIN  TRIGLYCERIDES  BRAIN NATRIURETIC PEPTIDE  LACTIC ACID, PLASMA  CBC WITH DIFFERENTIAL/PLATELET  COMPREHENSIVE METABOLIC PANEL  C-REACTIVE  PROTEIN  D-DIMER, QUANTITATIVE (NOT AT [facility])  FERRITIN  MAGNESIUM  PHOSPHORUS  TROPONIN I (HIGH SENSITIVITY)  TROPONIN I (HIGH SENSITIVITY)    EKG EKG Interpretation  Date/Time:  Friday June 14 2020 10:38:14 EST Ventricular Rate:  99 PR Interval:  142 QRS Duration: 74 QT Interval:  334 QTC Calculation: 428 R Axis:   5 Text Interpretation: Normal sinus rhythm Possible Inferior infarct , age undetermined Abnormal ECG since last tracing no significant change Confirmed by Noemi Chapel [phone]) on 06/05/2020 5:53:10 PM   Radiology DG Chest Portable 1 View  Result Date: 06/20/2020 CLINICAL DATA:  Shortness of breath, COVID-19 positive. EXAM: PORTABLE CHEST 1 VIEW COMPARISON:  June 10, 2020. FINDINGS: Stable cardiomediastinal silhouette. Status post coronary bypass graft. No pneumothorax or pleural effusion is noted. Right lung is clear. Mild left basilar patchy opacity is noted concerning for atelectasis or possibly infiltrate. Bony thorax is unremarkable. IMPRESSION: Mild left basilar patchy opacity is noted concerning for atelectasis or possibly infiltrate. Electronically Signed   By: Marijo Conception M.D.   On: 06/13/2020 13:55    Procedures Procedures (including critical care time)  Medications Ordered in ED Medications  remdesivir 200 mg in sodium chloride 0.9% 250 mL IVPB (200 mg Intravenous New Bag/Given 06/23/2020 2158)    Followed by  remdesivir 100 mg in sodium chloride 0.9 % 100 mL IVPB (has no administration in time range)  aspirin EC tablet 81 mg (81 mg Oral Given 06/12/2020 2136)  amLODipine (NORVASC) tablet 10 mg (10 mg Oral Given 06/30/2020 2136)  losartan (COZAAR) tablet 25 mg (25 mg Oral Given 06/13/2020 2157)  metoprolol tartrate (LOPRESSOR) tablet 50 mg (50 mg Oral Given 06/24/2020 2136)  Ipratropium-Albuterol (COMBIVENT) respimat 1 puff (has no administration in time range)  diclofenac Sodium (VOLTAREN) 1 % topical  gel 2 g (has no administration in time range)   enoxaparin (LOVENOX) injection 40 mg (40 mg Subcutaneous Given 06/29/2020 2135)  baricitinib (OLUMIANT) tablet 4 mg (4 mg Oral Given 06/16/2020 2157)  methylPREDNISolone sodium succinate (SOLU-MEDROL) 125 mg/2 mL injection 108.75 mg (108.75 mg Intravenous Given 06/30/2020 2136)    Followed by  predniSONE (DELTASONE) tablet 50 mg (has no administration in time range)  guaiFENesin-dextromethorphan (ROBITUSSIN DM) 100-10 MG/5ML syrup 10 mL (has no administration in time range)  insulin aspart (novoLOG) injection 0-15 Units (has no administration in time range)  ondansetron (ZOFRAN) tablet 4 mg (has no administration in time range)    Or  ondansetron (ZOFRAN) injection 4 mg (has no administration in time range)  acetaminophen (TYLENOL) tablet 650 mg (has no administration in time range)  0.9 %  sodium chloride infusion ( Intravenous New Bag/Given 06/11/2020 2136)  feeding supplement (ENSURE ENLIVE / ENSURE PLUS) liquid 237 mL (has no administration in time range)  dronabinol (MARINOL) capsule 2.5 mg (has no administration in time range)  lactated ringers bolus 1,000 mL (0 mLs Intravenous Stopped 06/29/2020 2137)  potassium chloride SA (KLOR-CON) CR tablet 40 mEq (40 mEq Oral Given 06/01/2020 1716)  dexamethasone (DECADRON) tablet 6 mg (6 mg Oral Given 06/13/2020 1716)  benzonatate (TESSALON) capsule 100 mg (100 mg Oral Given 06/01/2020 1825)    ED Course  I have reviewed the triage vital signs and the nursing notes.  Pertinent labs & imaging results that were available during my care of the patient were reviewed by me and considered in my medical decision making (see chart for details).    MDM Rules/Calculators/A&P                          This is a 80 year old, male, PMHx of CAD, CABG x2 in 2006, HTN, HLD, OA, DM, who presents with recent diagnosis of Covid-19 on Monday (5 days ago), which was the onset of his symptoms, cough, diarrhea, nausea, anorexia. He has had resolution of diarrhea, but is still having  anorexia, nausea, emesis x2, no appetite, and decreased urinary output. He feels general malaise and weakness. Patient has had both vaccinations, believes   Exam shows patient afebrile, hemodynamically stable, tachypneic, active coughing on exam, taken off oxygen with desaturation to 89%, put back on 2L with improvement to mid 90's. No significant lower extremity edema.  CBC WNL. CMP shows mild hypokalemia, albumin 3.1, bicarb 20 with anion gap of 16, which is felt to be secondary to starvation ketosis.   CXR concerning for atelectasis versus infiltrate more prominent on the left.  Given new O2 requirement in setting of covid, patient would benefit from steroids, /- antiviral, and admission.   Final Clinical Impression(s) / ED Diagnoses Final diagnoses:  [id number]    Rx / DC Orders ED Discharge Orders    None       Camila Li, MD 06/15/2020 2213    Noemi Chapel, MD 06/16/20 1537

## 2020-06-14 NOTE — ED Notes (Signed)
Updated patient's wife 

## 2020-06-14 NOTE — ED Triage Notes (Signed)
Pt tested positive for COVID on Monday, reports worsening fatigue, no appetite. Had a virtual visit with his PCP who told him to come here to get IV fluids. Pt 88% on room air, 2L [place] applied, pt at 97%. resp e.u

## 2020-06-14 NOTE — ED Provider Notes (Signed)
I saw and evaluated the patient, reviewed the resident's note and I agree with the findings and plan.  Pertinent History: Patient was diagnosed with COVID approximately 5 days ago, has had persistent and gradually worsening shortness of breath and presents today with not only weakness but decreased appetite and increasing dry cough.  The patient states he has had both vaccinations and the COVID-19 series  Pertinent Exam findings: On exam the patient has diffuse rales, there is some wheezing, he is tachypneic to 39 breaths/min and his oxygen dropped to the upper 80% range on room air abdomen is soft, no edema  I was personally present and directly supervised the following procedures:  Medical evaluation and resuscitation of acute respiratory failure due to COVID-19  This patient is critically ill and will require multiple interventions including persistent oxygen delivery secondary to hypoxia.  Steroids, albuterol, chest x-ray, EKG, labs, remdesivir, admission to hospital  .Critical Care Performed by: Noemi Chapel, MD Authorized by: Noemi Chapel, MD   Critical care provider statement:    Critical care time (minutes):  35   Critical care time was exclusive of:  Separately billable procedures and treating other patients and teaching time   Critical care was necessary to treat or prevent imminent or life-threatening deterioration of the following conditions:  Respiratory failure   Critical care was time spent personally by me on the following activities:  Blood draw for specimens, development of treatment plan with patient or surrogate, discussions with consultants, evaluation of patient's response to treatment, examination of patient, obtaining history from patient or surrogate, ordering and performing treatments and interventions, ordering and review of laboratory studies, ordering and review of radiographic studies, pulse oximetry, re-evaluation of patient's condition and review of old  charts    I personally interpreted the EKG as well as the resident and agree with the interpretation on the resident's chart.  Final diagnoses:  [id number]      Noemi Chapel, MD 06/16/20 1537

## 2020-06-14 NOTE — H&P (Signed)
History and Physical    Bradley French FGH:[id number] DOB: [birth year] DOA: 06/26/20  PCP: [facility] (Confirm with patient/family/NH records and if not entered, this has to be entered at [facility] point of entry) Patient coming from: Home  I have personally briefly reviewed patient's old medical records in [facility]  Chief Complaint: SOB  HPI: Bradley French is a 80 y.o. male with medical history significant of CAD s/p CABG in 2006, HTN, IIDM, OSA on CPAP, COPD, presented with cough and increasing short of breath.  Patient started to have symptoms of dry cough and shortness of breath 6 days ago, and he went to possible COVID-19 5 days ago.  He also had subjective fever and chills, loss of appetite but no abdominal pain or diarrhea.  He has not been eating or drinking much for the last 3 to 4 days.  He was vaccinated for COVID x2.  Today he went to see his PCP who checked his pulse ox in the office and found low and told him to come to ED. ED Course: COVID-positive chest x-ray showed bilateral multifocal infiltrates.  88% on room air, stabilized on 3 L 95%.  Review of Systems: As per HPI otherwise 14 point review of systems negative.    Past Medical History:  Diagnosis Date  . Anxiety   . Benign hematuria June '08   CT abdomen negative; cystoscopy - negative (at [facility])  . Cataract   . Colon polyps ???   none on 2006 colonoscopy  . COPD (chronic obstructive pulmonary disease) (HCC)   . Coronary artery disease   . Depression    PHQ-9 positive at [facility] - score of 4-6  . Diabetes mellitus without complication (HCC)   . Diverticulosis of colon   . Gastric ulcer 2006   ? secondary to H Pylori, vs ASA.   Marland Kitchen GERD (gastroesophageal reflux disease)   . H pylori ulcer 2006   treated with Prevpac  . Hemorrhoid   . Hypercholesteremia   . Hypertension   . Mental disorder   . OA (osteoarthritis) of knee    left knee: tricompartmental deterioration. s/p steroid injections  .  OSA on CPAP    diagnosed at [facility] in [facility]    Past Surgical History:  Procedure Laterality Date  . BACK SURGERY    . CARDIAC CATHETERIZATION    . CORONARY ARTERY BYPASS GRAFT  2006   2 vessell  . hemilaminectomy  June '12   L4-5 level due to claudication  . HEMORRHOID SURGERY  1981  . KNEE SURGERY     left knee  . STERNAL WIRE REMOVAL  '09  . TOTAL KNEE ARTHROPLASTY Left 11/05/2015   Procedure: TOTAL KNEE ARTHROPLASTY;  Surgeon: Kennedy Bucker, MD;  Location: [facility] ORS;  Service: Orthopedics;  Laterality: Left;     reports that he has been smoking cigarettes. He has a 11.25 pack-year smoking history. He has never used smokeless tobacco. He reports current alcohol use of about 6.0 standard drinks of alcohol per week. He reports that he does not use drugs.  Allergies  Allergen Reactions  . Sulfonamide Derivatives Rash    Family History  Problem Relation Age of Onset  . Diabetes Brother   . Alcohol abuse Father   . Alcohol abuse Sister   . Diabetes Brother   . Alcohol abuse Brother   . Cancer Neg Hx   . Heart disease Neg Hx      Prior to Admission medications  Medication Sig Start Date End Date Taking? Authorizing Provider  amLODipine (NORVASC) 10 MG tablet Take 1 tablet (10 mg total) by mouth daily. 10/07/13   Thurnell Lose, MD  aspirin EC 81 MG tablet Take 81 mg by mouth daily.    [provider]  Aspirin-Caffeine (BC FAST PAIN RELIEF ARTHRITIS) 1000-65 MG PACK Take 1 Package by mouth in the morning and at bedtime.    [provider]  diclofenac Sodium (VOLTAREN) 1 % GEL Apply topically daily.    [provider]  furosemide (LASIX) 20 MG tablet Take 1 tablet (20 mg total) by mouth daily. 12/21/18   Quintella Reichert, MD  Ipratropium-Albuterol (COMBIVENT RESPIMAT IN) Inhale 1 puff into the lungs 4 (four) times daily as needed.    [provider]  losartan (COZAAR) 50 MG tablet Take 25 mg by mouth daily.    [provider]  metFORMIN  (GLUCOPHAGE) 500 MG tablet Take 500 mg by mouth daily after breakfast.    [provider]  metoprolol tartrate (LOPRESSOR) 50 MG tablet Take 50 mg by mouth 2 (two) times daily.    [provider]  potassium chloride SA (KLOR-CON) 20 MEQ tablet Take 2 tablets (40 mEq total) by mouth 2 (two) times daily. 04/12/20   Donella Stade, PA-C    Physical Exam: Vitals:   2020-07-05 1456 2020-07-05 1615 July 05, 2020 1715 2020-07-05 1845  BP: (!) 124/59 (!) 114/56 134/67 (!) 142/71  Pulse: (!) 103 94 98 94  Resp: (!) 39 (!) 37 (!) 28 (!) 38  Temp:      TempSrc:      SpO2: 94% 93% 95% 93%  Weight:      Height:        Constitutional: NAD, calm, comfortable Vitals:   07/05/2020 1456 Jul 05, 2020 1615 2020/07/05 1715 07/05/2020 1845  BP: (!) 124/59 (!) 114/56 134/67 (!) 142/71  Pulse: (!) 103 94 98 94  Resp: (!) 39 (!) 37 (!) 28 (!) 38  Temp:      TempSrc:      SpO2: 94% 93% 95% 93%  Weight:      Height:       Eyes: PERRL, lids and conjunctivae normal ENMT: Mucous membranes are dry. Posterior pharynx clear of any exudate or lesions.Normal dentition.  Neck: normal, supple, no masses, no thyromegaly Respiratory: clear to auscultation bilaterally, scattered wheezing, no crackles.  Increased respiratory effort. No accessory muscle use.  Cardiovascular: Regular rate and rhythm, no murmurs / rubs / gallops. No extremity edema. 2+ pedal pulses. No carotid bruits.  Abdomen: no tenderness, no masses palpated. No hepatosplenomegaly. Bowel sounds positive.  Musculoskeletal: no clubbing / cyanosis. No joint deformity upper and lower extremities. Good ROM, no contractures. Normal muscle tone.  Skin: no rashes, lesions, ulcers. No induration Neurologic: CN 2-12 grossly intact. Sensation intact, DTR normal. Strength 5/5 in all 4.  Psychiatric: Normal judgment and insight. Alert and oriented x 3. Normal mood.     Labs on Admission: I have personally reviewed following labs and imaging  studies  CBC: Recent Labs  Lab 07/05/2020 1045  WBC 5.6  HGB 16.7  HCT 50.7  MCV 90.7  PLT 277*   Basic Metabolic Panel: Recent Labs  Lab 2020-07-05 1045 Jul 05, 2020 1740  NA 136  --   K 3.3*  --   CL 99  --   CO2 21*  --   GLUCOSE 96  --   BUN 9  --   CREATININE 1.15  --  CALCIUM 8.2*  --   MG  --  2.2   GFR: Estimated Creatinine Clearance: 71.6 mL/min (by C-G formula based on SCr of 1.15 mg/dL). Liver Function Tests: Recent Labs  Lab 06/01/2020 1045  AST 77*  ALT 35  ALKPHOS 49  BILITOT 1.6*  PROT 6.8  ALBUMIN 3.1*   No results for input(s): LIPASE, AMYLASE in the last 168 hours. No results for input(s): AMMONIA in the last 168 hours. Coagulation Profile: No results for input(s): INR, PROTIME in the last 168 hours. Cardiac Enzymes: No results for input(s): CKTOTAL, CKMB, CKMBINDEX, TROPONINI in the last 168 hours. BNP (last 3 results) No results for input(s): PROBNP in the last 8760 hours. HbA1C: No results for input(s): HGBA1C in the last 72 hours. CBG: No results for input(s): GLUCAP in the last 168 hours. Lipid Profile: Recent Labs    06/23/2020 1830  TRIG 135   Thyroid Function Tests: No results for input(s): TSH, T4TOTAL, FREET4, T3FREE, THYROIDAB in the last 72 hours. Anemia Panel: No results for input(s): VITAMINB12, FOLATE, FERRITIN, TIBC, IRON, RETICCTPCT in the last 72 hours. Urine analysis:    Component Value Date/Time   COLORURINE STRAW (A) 04/27/2020 1836   APPEARANCEUR CLEAR 04/27/2020 1836   LABSPEC 1.002 (L) 04/27/2020 1836   PHURINE 6.0 04/27/2020 1836   GLUCOSEU NEGATIVE 04/27/2020 1836   HGBUR SMALL (A) 04/27/2020 1836   BILIRUBINUR NEGATIVE 04/27/2020 1836   KETONESUR NEGATIVE 04/27/2020 1836   PROTEINUR NEGATIVE 04/27/2020 1836   UROBILINOGEN 0.2 10/04/2013 0104   NITRITE NEGATIVE 04/27/2020 1836   LEUKOCYTESUR NEGATIVE 04/27/2020 1836    Radiological Exams on Admission: DG Chest Portable 1 View  Result Date:  06/22/2020 CLINICAL DATA:  Shortness of breath, COVID-19 positive. EXAM: PORTABLE CHEST 1 VIEW COMPARISON:  June 10, 2020. FINDINGS: Stable cardiomediastinal silhouette. Status post coronary bypass graft. No pneumothorax or pleural effusion is noted. Right lung is clear. Mild left basilar patchy opacity is noted concerning for atelectasis or possibly infiltrate. Bony thorax is unremarkable. IMPRESSION: Mild left basilar patchy opacity is noted concerning for atelectasis or possibly infiltrate. Electronically Signed   By: Marijo Conception M.D.   On: 06/06/2020 13:55    EKG: Independently reviewed. Sinus, similar ST-T changes on III and aVF.  Assessment/Plan Active Problems:   COVID-19 virus infection   COVID-19  (please populate well all problems here in Problem List. (For example, if patient is on BP meds at home and you resume or decide to hold them, it is a problem that needs to be her. Same for CAD, COPD, HLD and so on)   Acute hypoxic restaurant failure secondary to COVID PNA -Start remdesivir breathing treatment, steroid and Olumiant -Wean down O2 -COVID lab pending  Acute COPD exacerbation -Likely triggered by COVID infection -Breathing treatment, steroid as above.  Severe dehydration -IVf x12 hours then reevaluate  Deconditioning -Supplement, trial of Marinol -PT evaluation  CAD status post CABG -similar chronic ST-T changes on III and aVF. -No chest pain, Trop sent  HTN -Continue home meds, hold Lasix for dehydration  DVT prophylaxis: Lovenox  code Status: Full Code Family Communication: Wife over phone Disposition Plan: Expect more than 2 midnight hospital stay for COVID-pneumonia treatment none weaning down oxygen Consults called: None Admission status: Tele admit   Lequita Halt MD [facility] Pager [phone]  06/03/2020, 7:34 PM

## 2020-06-15 DIAGNOSIS — U071 COVID-19: Principal | ICD-10-CM

## 2020-06-15 LAB — COMPREHENSIVE METABOLIC PANEL
ALT: 33 U/L (ref 0–44)
AST: 69 U/L — ABNORMAL HIGH (ref 15–41)
Albumin: 2.7 g/dL — ABNORMAL LOW (ref 3.5–5.0)
Alkaline Phosphatase: 51 U/L (ref 38–126)
Anion gap: 12 (ref 5–15)
BUN: 9 mg/dL (ref 8–23)
CO2: 23 mmol/L (ref 22–32)
Calcium: 7.7 mg/dL — ABNORMAL LOW (ref 8.9–10.3)
Chloride: 104 mmol/L (ref 98–111)
Creatinine, Ser: 0.82 mg/dL (ref 0.61–1.24)
GFR, Estimated: >60 mL/min (ref >60)
Glucose, Bld: 144 mg/dL — ABNORMAL HIGH (ref 70–99)
Potassium: 4 mmol/L (ref 3.5–5.1)
Sodium: 139 mmol/L (ref 135–145)
Total Bilirubin: 1.1 mg/dL (ref 0.3–1.2)
Total Protein: 6.1 g/dL — ABNORMAL LOW (ref 6.5–8.1)

## 2020-06-15 LAB — CBG MONITORING, ED
Glucose-Capillary: 155 mg/dL — ABNORMAL HIGH (ref 70–99)
Glucose-Capillary: 174 mg/dL — ABNORMAL HIGH (ref 70–99)
Glucose-Capillary: 182 mg/dL — ABNORMAL HIGH (ref 70–99)
Glucose-Capillary: 187 mg/dL — ABNORMAL HIGH (ref 70–99)
Glucose-Capillary: 180 mg/dL — ABNORMAL HIGH (ref 70–99)

## 2020-06-15 LAB — CBC WITH DIFFERENTIAL/PLATELET
Abs Immature Granulocytes: 0.01 10*3/uL (ref 0.00–0.07)
Basophils Absolute: 0 10*3/uL (ref 0.0–0.1)
Basophils Relative: 0 %
Eosinophils Absolute: 0 10*3/uL (ref 0.0–0.5)
Eosinophils Relative: 0 %
HCT: 47.6 % (ref 39.0–52.0)
Hemoglobin: 15.9 g/dL (ref 13.0–17.0)
Immature Granulocytes: 0 %
Lymphocytes Relative: 14 %
Lymphs Abs: 0.6 10*3/uL — ABNORMAL LOW (ref 0.7–4.0)
MCH: 30.2 pg (ref 26.0–34.0)
MCHC: 33.4 g/dL (ref 30.0–36.0)
MCV: 90.3 fL (ref 80.0–100.0)
Monocytes Absolute: 0.1 10*3/uL (ref 0.1–1.0)
Monocytes Relative: 3 %
Neutro Abs: 3.5 10*3/uL (ref 1.7–7.7)
Neutrophils Relative %: 83 %
Platelets: 126 10*3/uL — ABNORMAL LOW (ref 150–400)
RBC: 5.27 MIL/uL (ref 4.22–5.81)
RDW: 13.6 % (ref 11.5–15.5)
WBC: 4.3 10*3/uL (ref 4.0–10.5)
nRBC: 0 % (ref 0.0–0.2)

## 2020-06-15 LAB — MAGNESIUM: Magnesium: 1.8 mg/dL (ref 1.7–2.4)

## 2020-06-15 LAB — D-DIMER, QUANTITATIVE: D-Dimer, Quant: 0.89 ug/mL-FEU — ABNORMAL HIGH (ref 0.00–0.50)

## 2020-06-15 LAB — PHOSPHORUS: Phosphorus: 2.5 mg/dL (ref 2.5–4.6)

## 2020-06-15 LAB — FERRITIN: Ferritin: 1923 ng/mL — ABNORMAL HIGH (ref 24–336)

## 2020-06-15 LAB — TROPONIN I (HIGH SENSITIVITY): Troponin I (High Sensitivity): 18 ng/L — ABNORMAL HIGH (ref <18)

## 2020-06-15 LAB — C-REACTIVE PROTEIN: CRP: 17.3 mg/dL — ABNORMAL HIGH (ref <1.0)

## 2020-06-15 MED ORDER — METHYLPREDNISOLONE SODIUM SUCC 40 MG IJ SOLR
40.0000 mg | Freq: Two times a day (BID) | INTRAMUSCULAR | Status: DC
  Administered 2020-06-15 – 2020-07-02 (×34): 40 mg via INTRAVENOUS
  Filled 2020-06-15 (×36): qty 1

## 2020-06-15 NOTE — ED Notes (Signed)
This RN found pt had removed Bradley French. Frequently reminding pt to keep [place] on due to O2 saturation dropping when it is removed.

## 2020-06-15 NOTE — ED Notes (Signed)
Pt desatting to spo2 79% on 4 liters [place], now pt given 5 liters spo2 88% at this time.

## 2020-06-15 NOTE — ED Notes (Signed)
Breakfast Ordered 

## 2020-06-15 NOTE — ED Notes (Signed)
Asked pt to prone, pt refused. Educated on benefits of proning, pt stated "I can't lay on my stomach". This RN assisted to lay side lying.

## 2020-06-15 NOTE — ED Notes (Signed)
Notified Dr. Avon Gully of pt's increasing O2 needs. Arrived on shift to find pt sating at 80% on 5 L [place]. When in the room pt removed O2 to blow nose. This RN reminded pt that O2 needs to be kept on due the fact that his O2 is dropping so quickly. Once O2 placed back on O2 sat still remaining between 83-88%. This RN moved pt up to 6 [place] a,d 10 L NRB= 16 L w/ improvement to 90%, called RT to place pt on salters tx. Pt now sating 91-93% on salters tx.

## 2020-06-15 NOTE — Progress Notes (Signed)
Patient wants to wait until he gets a room upstairs for CPAP.

## 2020-06-15 NOTE — Progress Notes (Signed)
Pt had assistance to move to side of bed and to scoot up the bed but is unable to maintain sats at minimum 85% with any movement.  Has a slow recovery, which per nsg is the doctor's request to keep him at the cut level to avoid this repetitive hypoxia.  Recommending rehab since pt is unable to get far with mobility, to recover his loss of endurance and strength from this illness.  PT will follow acutely to get him as far as possible for home.  06/15/20 1400  PT Visit Information  Last PT Received On 06/15/20  History of Present Illness 80 yo male with onset of SOB and cough.  Has been home with his wife, had poor intake and feeling bad.  PMHx:  CABG, HTN, DM, OSA, CPAP, COPD, CAD, anxiety, cataracts, colon polyps, depression, ulcer,  Precautions  Precautions Fall  Precaution Comments monitor HR and sats  Restrictions  Weight Bearing Restrictions No  Other Position/Activity Restrictions consider height of head re sats  Home Living  Family/patient expects to be discharged to: Private residence  Living Arrangements Spouse/significant other  Available Help at Discharge Family;Available 24 hours/day  Type of Home House  Home Access Stairs to enter  Entrance Stairs-Number of Steps 3 or 5  Entrance Stairs-Rails Can reach both;Left;Right  Home Layout One level  Research officer, 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 point  Additional Comments has been driving and out in community, no restrictions  Prior Function  Level of Independence Independent with assistive device(s)  Communication  Communication No difficulties  Pain Assessment  Pain Assessment No/denies pain  Cognition  Arousal/Alertness Awake/alert  Behavior During Therapy WFL for tasks assessed/performed  Overall Cognitive Status Within Functional Limits for tasks assessed  General Comments able to give history, SOB with exertion and sats drop with conversation  Upper Extremity Assessment  Upper Extremity Assessment Overall WFL for  tasks assessed  Lower Extremity Assessment  Lower Extremity Assessment Generalized weakness  Cervical / Trunk Assessment  Cervical / Trunk Assessment Normal  Bed Mobility  Overal bed mobility Needs Assistance  Bed Mobility Supine to Sit;Sit to Supine  Supine to sit Min assist  Sit to supine Min assist  General bed mobility comments assisted up in bed with exertion leading to drop in sats down to 80%  Transfers  Overall transfer level Needs assistance  Transfers Lateral/Scoot Transfers   Lateral/Scoot Transfers Min assist  General transfer comment monitored O2 sats but down to 80% with movement  Ambulation/Gait  General Gait Details deferred due to sats  Balance  Overall balance assessment Needs assistance  Sitting-balance support Feet supported  Sitting balance-Leahy Scale Fair  General Comments  General comments (skin integrity, edema, etc.) pt is desaturated and struggling to maintain 85% mandated by MD.  Nursing in to get pt into better posture, ended up on his side as pt cannot lie prone  Exercises  Exercises Other exercises (4- hip and otherwise 4+ to 5)  PT - End of Session  Equipment Utilized During Treatment Oxygen  Activity Tolerance Patient limited by fatigue;Treatment limited secondary to medical complications (Comment)  Patient left in bed;with call bell/phone within reach  Nurse Communication Mobility status;Other (comment) (O2 sat changes with movemetn)  PT Assessment  PT Recommendation/Assessment Patient needs continued PT services  PT Visit Diagnosis Muscle weakness (generalized) (M62.81);Dizziness and giddiness (R42)  PT Problem List Decreased strength;Decreased activity tolerance;Decreased balance;Decreased coordination;Decreased knowledge of use of DME;Cardiopulmonary status limiting activity  Barriers to Discharge Inaccessible home  environment  Barriers to Discharge Comments has stairs at all entrances  PT Plan  PT Frequency (ACUTE ONLY) Min 3X/week  PT  Treatment/Interventions (ACUTE ONLY) DME instruction;Gait training;Stair training;Functional mobility training;Therapeutic activities;Therapeutic exercise;Balance training;Neuromuscular re-education;Patient/family education  AM-PAC PT "6 Clicks" Mobility Outcome Measure (Version 2)  Help needed turning from your back to your side while in a flat bed without using bedrails? 3  Help needed moving from lying on your back to sitting on the side of a flat bed without using bedrails? 3  Help needed moving to and from a bed to a chair (including a wheelchair)? 3  Help needed standing up from a chair using your arms (e.g., wheelchair or bedside chair)? 2  Help needed to walk in hospital room? 2  Help needed climbing 3-5 steps with a railing?  2  6 Click Score 15  Consider Recommendation of Discharge To: CIR/SNF/LTACH  PT Recommendation  Follow Up Recommendations SNF  PT equipment None recommended by PT  Individuals Consulted  Consulted and Agree with Results and Recommendations Patient  Acute Rehab PT Goals  Patient Stated Goal to get home and feel better  PT Goal Formulation With patient  Time For Goal Achievement 06/29/20  Potential to Achieve Goals Good  PT Time Calculation  PT Start Time (ACUTE ONLY) 1149  PT Stop Time (ACUTE ONLY) 1216  PT Time Calculation (min) (ACUTE ONLY) 27 min  PT General Charges  $$ ACUTE PT VISIT 1 Visit  PT Evaluation  $PT Eval Moderate Complexity 1 Mod  PT Treatments  $Therapeutic Activity 8-22 mins  Written Expression  Dominant Hand Right    Mee Hives, PT MS Acute Rehab Dept. Number: [place]

## 2020-06-15 NOTE — ED Notes (Signed)
Blanch Media, wife, [phone] would like an update when available

## 2020-06-15 NOTE — Progress Notes (Signed)
PROGRESS NOTE    Bradley French  FAO:[id number] DOB: January 14, 1941 DOA: Jun 28, 2020 PCP: [facility]   Brief Narrative:  Bradley French is a 80 y.o. male with medical history significant of CAD s/p CABG in 2006, HTN, IIDM, OSA on CPAP, COPD, presented with cough and increasing short of breath. Patient started to have symptoms of dry cough and shortness of breath 6 days ago, and he went to possible COVID-19 5 days ago.  He also had subjective fever and chills, loss of appetite but no abdominal pain or diarrhea.  He has not been eating or drinking much for the last 3 to 4 days.  He was vaccinated for COVID x2. Went to see his PCP who checked his pulse ox in the office and found low and told him to come to ED. COVID-positive with chest x-ray showed bilateral multifocal infiltrates.  88% on room air, stabilized on 3 L 95%.  Assessment & Plan:   Active Problems:   COVID-19 virus infection   COVID-19  Acute hypoxic restaurant failure secondary to COVID PNA Questionable concurrent COPD exacerbation -Continue remdesivir, baricitinib, steroids -Wean down O2 as tolerated -Inhalers as needed - avoid nebulizers given covid + status Recent Labs    28-Jun-2020 1740 06/15/20 0335 06/15/20 0336  DDIMER 1.08* 0.89*  --   FERRITIN 1,883*  --  1,923*  LDH 403*  --   --   CRP 15.6*  --  17.3*   Severe dehydration without creatinine elevation, POA - Completed IVF - increase PO intake as tolerated - Likely 2/2 above  Deconditioning, ambulatory dysfunction, POA Failure to thrive - Supplement, trial of Marinol - PT evaluation  CAD status post CABG -similar chronic ST-T changes on III and aVF. -No chest pain, Trop sent  HTN -Continue home meds, hold Lasix for dehydration  DVT prophylaxis: Lovenox  Code Status: Full Code Family Communication: Patient to update  Status is: Inpt  Dispo: The patient is from: Home              Anticipated d/c is to: Home              Anticipated  d/c date is: 48 to 72 hours              Patient currently not medically stable for discharge  Consultants:   None  Procedures:   None  Antimicrobials:  Remdesivir  Subjective: No acute issues or events overnight, respiratory status still tenuous feels no better nor any worse than admission.  Denies nausea, vomiting, diarrhea, constipation, headache, fevers, chills.  Objective: Vitals:   06/15/20 1145 06/15/20 1245 06/15/20 1300 06/15/20 1330  BP:  (!) 129/100 (!) 112/56   Pulse: 65 77 71 80  Resp: (!) 23 (!) 35 (!) 22 (!) 36  Temp:      TempSrc:      SpO2: (!) 87% (!) 86% (!) 87% 92%  Weight:      Height:        Intake/Output Summary (Last 24 hours) at 06/15/2020 1430 Last data filed at 06/15/2020 1009 Gross per 24 hour  Intake 2290.65 ml  Output --  Net 2290.65 ml   Filed Weights   2020-06-28 1039  Weight: 108.9 kg    Examination:  General exam: Appears calm and comfortable  Respiratory system: Clear to auscultation. Respiratory effort normal. Cardiovascular system: S1 & S2 heard, RRR. No JVD, murmurs, rubs, gallops or clicks. No pedal edema. Gastrointestinal system: Abdomen is nondistended, soft and nontender.  No organomegaly or masses felt. Normal bowel sounds heard. Central nervous system: Alert and oriented. No focal neurological deficits. Extremities: Symmetric 5 x 5 power. Skin: No rashes, lesions or ulcers Psychiatry: Judgement and insight appear normal. Mood & affect appropriate.    Data Reviewed: I have personally reviewed following labs and imaging studies  CBC: Recent Labs  Lab 06/03/2020 1045 06/15/20 0335  WBC 5.6 4.3  NEUTROABS  --  3.5  HGB 16.7 15.9  HCT 50.7 47.6  MCV 90.7 90.3  PLT 138* 809*   Basic Metabolic Panel: Recent Labs  Lab 06/15/2020 1045 07/01/2020 1740 06/15/20 0335  NA 136  --  139  K 3.3*  --  4.0  CL 99  --  104  CO2 21*  --  23  GLUCOSE 96  --  144*  BUN 9  --  9  CREATININE 1.15  --  0.82  CALCIUM 8.2*  --   7.7*  MG  --  2.2 1.8  PHOS  --   --  2.5   GFR: Estimated Creatinine Clearance: 100.4 mL/min (by C-G formula based on SCr of 0.82 mg/dL). Liver Function Tests: Recent Labs  Lab 06/19/2020 1045 06/15/20 0335  AST 77* 69*  ALT 35 33  ALKPHOS 49 51  BILITOT 1.6* 1.1  PROT 6.8 6.1*  ALBUMIN 3.1* 2.7*   No results for input(s): LIPASE, AMYLASE in the last 168 hours. No results for input(s): AMMONIA in the last 168 hours. Coagulation Profile: No results for input(s): INR, PROTIME in the last 168 hours. Cardiac Enzymes: No results for input(s): CKTOTAL, CKMB, CKMBINDEX, TROPONINI in the last 168 hours. BNP (last 3 results) No results for input(s): PROBNP in the last 8760 hours. HbA1C: No results for input(s): HGBA1C in the last 72 hours. CBG: Recent Labs  Lab 06/25/2020 2250 06/15/20 0815 06/15/20 1238  GLUCAP 117* 155* 174*   Lipid Profile: Recent Labs    06/04/2020 1830  TRIG 135   Thyroid Function Tests: No results for input(s): TSH, T4TOTAL, FREET4, T3FREE, THYROIDAB in the last 72 hours. Anemia Panel: Recent Labs    06/03/2020 1740 06/15/20 0336  FERRITIN 1,883* 1,923*   Sepsis Labs: Recent Labs  Lab 06/12/2020 1740 07/01/2020 1820  PROCALCITON 0.22  --   LATICACIDVEN  --  1.6    No results found for this or any previous visit (from the past 240 hour(s)).   Radiology Studies: DG Chest Portable 1 View  Result Date: 06/17/2020 CLINICAL DATA:  Shortness of breath, COVID-19 positive. EXAM: PORTABLE CHEST 1 VIEW COMPARISON:  June 10, 2020. FINDINGS: Stable cardiomediastinal silhouette. Status post coronary bypass graft. No pneumothorax or pleural effusion is noted. Right lung is clear. Mild left basilar patchy opacity is noted concerning for atelectasis or possibly infiltrate. Bony thorax is unremarkable. IMPRESSION: Mild left basilar patchy opacity is noted concerning for atelectasis or possibly infiltrate. Electronically Signed   By: Marijo Conception M.D.   On:  06/12/2020 13:55   Scheduled Meds: . amLODipine  10 mg Oral Q2000  . aspirin EC  81 mg Oral Q2000  . baricitinib  4 mg Oral Daily  . diclofenac Sodium  2 g Topical Q2000  . dronabinol  2.5 mg Oral BID AC  . enoxaparin (LOVENOX) injection  40 mg Subcutaneous Q24H  . feeding supplement  237 mL Oral BID BM  . insulin aspart  0-15 Units Subcutaneous TID WC  . Ipratropium-Albuterol  1 puff Inhalation Q6H  . losartan  25 mg  Oral Q2000  . methylPREDNISolone (SOLU-MEDROL) injection  40 mg Intravenous Q12H  . metoprolol tartrate  50 mg Oral BID   Continuous Infusions: . remdesivir 100 mg in NS 100 mL Stopped (06/15/20 1009)     LOS: 1 day   Time spent: 68min  Bradley Bach C Saharah Sherrow, DO [facility]  If 7PM-7AM, please contact night-coverage [email or web address]  06/15/2020, 2:30 PM

## 2020-06-16 DIAGNOSIS — U071 COVID-19: Secondary | ICD-10-CM | POA: Diagnosis not present

## 2020-06-16 LAB — CBC WITH DIFFERENTIAL/PLATELET
Abs Immature Granulocytes: 0.02 10*3/uL (ref 0.00–0.07)
Basophils Absolute: 0 10*3/uL (ref 0.0–0.1)
Basophils Relative: 0 %
Eosinophils Absolute: 0 10*3/uL (ref 0.0–0.5)
Eosinophils Relative: 0 %
HCT: 49.2 % (ref 39.0–52.0)
Hemoglobin: 17.4 g/dL — ABNORMAL HIGH (ref 13.0–17.0)
Immature Granulocytes: 0 %
Lymphocytes Relative: 18 %
Lymphs Abs: 0.9 10*3/uL (ref 0.7–4.0)
MCH: 31.4 pg (ref 26.0–34.0)
MCHC: 35.4 g/dL (ref 30.0–36.0)
MCV: 88.6 fL (ref 80.0–100.0)
Monocytes Absolute: 0.5 10*3/uL (ref 0.1–1.0)
Monocytes Relative: 10 %
Neutro Abs: 3.4 10*3/uL (ref 1.7–7.7)
Neutrophils Relative %: 72 %
Platelets: 208 10*3/uL (ref 150–400)
RBC: 5.55 MIL/uL (ref 4.22–5.81)
RDW: 14 % (ref 11.5–15.5)
WBC: 4.8 10*3/uL (ref 4.0–10.5)
nRBC: 0 % (ref 0.0–0.2)

## 2020-06-16 LAB — COMPREHENSIVE METABOLIC PANEL
ALT: 38 U/L (ref 0–44)
AST: 69 U/L — ABNORMAL HIGH (ref 15–41)
Albumin: 3 g/dL — ABNORMAL LOW (ref 3.5–5.0)
Alkaline Phosphatase: 65 U/L (ref 38–126)
Anion gap: 14 (ref 5–15)
BUN: 21 mg/dL (ref 8–23)
CO2: 22 mmol/L (ref 22–32)
Calcium: 8.2 mg/dL — ABNORMAL LOW (ref 8.9–10.3)
Chloride: 102 mmol/L (ref 98–111)
Creatinine, Ser: 1.07 mg/dL (ref 0.61–1.24)
GFR, Estimated: >60 mL/min (ref >60)
Glucose, Bld: 189 mg/dL — ABNORMAL HIGH (ref 70–99)
Potassium: 4.2 mmol/L (ref 3.5–5.1)
Sodium: 138 mmol/L (ref 135–145)
Total Bilirubin: 1.6 mg/dL — ABNORMAL HIGH (ref 0.3–1.2)
Total Protein: 7.2 g/dL (ref 6.5–8.1)

## 2020-06-16 LAB — CBG MONITORING, ED
Glucose-Capillary: 184 mg/dL — ABNORMAL HIGH (ref 70–99)
Glucose-Capillary: 167 mg/dL — ABNORMAL HIGH (ref 70–99)
Glucose-Capillary: 223 mg/dL — ABNORMAL HIGH (ref 70–99)

## 2020-06-16 LAB — C-REACTIVE PROTEIN: CRP: 10.1 mg/dL — ABNORMAL HIGH (ref <1.0)

## 2020-06-16 LAB — MAGNESIUM: Magnesium: 2.3 mg/dL (ref 1.7–2.4)

## 2020-06-16 LAB — PHOSPHORUS: Phosphorus: 2.9 mg/dL (ref 2.5–4.6)

## 2020-06-16 LAB — FERRITIN: Ferritin: 2267 ng/mL — ABNORMAL HIGH (ref 24–336)

## 2020-06-16 LAB — D-DIMER, QUANTITATIVE: D-Dimer, Quant: 0.83 ug/mL-FEU — ABNORMAL HIGH (ref 0.00–0.50)

## 2020-06-16 NOTE — ED Notes (Signed)
Pt's O2 intermittantly falls into low 80"s. Pt placed on 15L NRB in addition to 15L humidified hi-flow.

## 2020-06-16 NOTE — Progress Notes (Signed)
PROGRESS NOTE    Bradley French  [id number] DOB: 1941/04/19 DOA: 06/29/2020 PCP: [facility]   Brief Narrative:  Bradley French is a 80 y.o. male with medical history significant of CAD s/p CABG in 2006, HTN, IIDM, OSA on CPAP, COPD, presented with cough and increasing short of breath. Patient started to have symptoms of dry cough and shortness of breath 6 days ago, and he went to possible COVID-19 5 days ago.  He also had subjective fever and chills, loss of appetite but no abdominal pain or diarrhea.  He has not been eating or drinking much for the last 3 to 4 days.  He was vaccinated for COVID x2. Went to see his PCP who checked his pulse ox in the office and found low and told him to come to ED. COVID-positive with chest x-ray showed bilateral multifocal infiltrates.  88% on room air, stabilized on 3 L 95%.  Assessment & Plan:   Active Problems:   COVID-19 virus infection   COVID-19  Acute hypoxic restaurant failure secondary to COVID PNA Questionable concurrent COPD exacerbation -Continue remdesivir, baricitinib, steroids -Wean down O2 as tolerated SpO2: 96 % O2 Flow Rate (L/min): 15 L/min (HFNC 15L) -Inhalers as needed - avoid nebulizers given covid + status Recent Labs    06/05/2020 1740 06/15/20 0335 06/15/20 0336 06/16/20 0445  DDIMER 1.08* 0.89*  --  0.83*  FERRITIN 1,883*  --  1,923* 2,267*  LDH 403*  --   --   --   CRP 15.6*  --  17.3* 10.1*   Severe dehydration without creatinine elevation, POA - Completed IVF - increase PO intake as tolerated - Likely 2/2 above  Deconditioning, ambulatory dysfunction, POA Failure to thrive - Supplement, trial of Marinol - PT evaluation  CAD status post CABG -similar chronic ST-T changes on III and aVF. -No chest pain, Trop sent  HTN -Continue home meds, hold Lasix for dehydration  DVT prophylaxis: Lovenox  Code Status: Full Code Family Communication: Patient to update  Status is: Inpt  Dispo:  The patient is from: Home              Anticipated d/c is to: Home              Anticipated d/c date is: 48 to 72 hours              Patient currently not medically stable for discharge  Consultants:   None  Procedures:   None  Antimicrobials:  Remdesivir  Subjective: No acute issues or events overnight, respiratory status stabilizing - feels slightly better.  Denies nausea, vomiting, diarrhea, constipation, headache, fevers, chills.  Objective: Vitals:   06/16/20 0115 06/16/20 0200 06/16/20 0415 06/16/20 0556  BP: 109/66 125/73 120/67 118/83  Pulse: 80 87 83 83  Resp: 20 20 (!) 22 (!) 24  Temp:      TempSrc:      SpO2: 90% 92% 96% 90%  Weight:      Height:        Intake/Output Summary (Last 24 hours) at 06/16/2020 0718 Last data filed at 06/15/2020 1009 Gross per 24 hour  Intake 1000 ml  Output --  Net 1000 ml   Filed Weights   06/07/2020 1039  Weight: 108.9 kg    Examination:  General exam: Appears calm and comfortable  Respiratory system: Clear to auscultation. Respiratory effort normal. Cardiovascular system: S1 & S2 heard, RRR. No JVD, murmurs, rubs, gallops or clicks. No pedal edema. Gastrointestinal  system: Abdomen is nondistended, soft and nontender. No organomegaly or masses felt. Normal bowel sounds heard. Central nervous system: Alert and oriented. No focal neurological deficits. Extremities: Symmetric 5 x 5 power. Skin: No rashes, lesions or ulcers Psychiatry: Judgement and insight appear normal. Mood & affect appropriate.    Data Reviewed: I have personally reviewed following labs and imaging studies  CBC: Recent Labs  Lab 06/08/2020 1045 06/15/20 0335 06/16/20 0445  WBC 5.6 4.3 4.8  NEUTROABS  --  3.5 3.4  HGB 16.7 15.9 17.4*  HCT 50.7 47.6 49.2  MCV 90.7 90.3 88.6  PLT 138* 126* [id number]   Basic Metabolic Panel: Recent Labs  Lab 06/26/2020 1045 06/13/2020 1740 06/15/20 0335 06/16/20 0445  NA 136  --  139 138  K 3.3*  --  4.0 4.2  CL 99   --  104 102  CO2 21*  --  23 22  GLUCOSE 96  --  144* 189*  BUN 9  --  9 21  CREATININE 1.15  --  0.82 1.07  CALCIUM 8.2*  --  7.7* 8.2*  MG  --  2.2 1.8 2.3  PHOS  --   --  2.5 2.9   GFR: Estimated Creatinine Clearance: 76.9 mL/min (by C-G formula based on SCr of 1.07 mg/dL). Liver Function Tests: Recent Labs  Lab 06/15/2020 1045 06/15/20 0335 06/16/20 0445  AST 77* 69* 69*  ALT 35 33 38  ALKPHOS 49 51 65  BILITOT 1.6* 1.1 1.6*  PROT 6.8 6.1* 7.2  ALBUMIN 3.1* 2.7* 3.0*   No results for input(s): LIPASE, AMYLASE in the last 168 hours. No results for input(s): AMMONIA in the last 168 hours. Coagulation Profile: No results for input(s): INR, PROTIME in the last 168 hours. Cardiac Enzymes: No results for input(s): CKTOTAL, CKMB, CKMBINDEX, TROPONINI in the last 168 hours. BNP (last 3 results) No results for input(s): PROBNP in the last 8760 hours. HbA1C: No results for input(s): HGBA1C in the last 72 hours. CBG: Recent Labs  Lab 06/15/20 0815 06/15/20 1238 06/15/20 1502 06/15/20 1814 06/15/20 2256  GLUCAP 155* 174* 182* 187* 180*   Lipid Profile: Recent Labs    06/24/2020 1830  TRIG 135   Thyroid Function Tests: No results for input(s): TSH, T4TOTAL, FREET4, T3FREE, THYROIDAB in the last 72 hours. Anemia Panel: Recent Labs    06/15/20 0336 06/16/20 0445  FERRITIN 1,923* 2,267*   Sepsis Labs: Recent Labs  Lab 06/03/2020 1740 06/02/2020 1820  PROCALCITON 0.22  --   LATICACIDVEN  --  1.6    No results found for this or any previous visit (from the past 240 hour(s)).   Radiology Studies: DG Chest Portable 1 View  Result Date: 06/16/2020 CLINICAL DATA:  Shortness of breath, COVID-19 positive. EXAM: PORTABLE CHEST 1 VIEW COMPARISON:  June 10, 2020. FINDINGS: Stable cardiomediastinal silhouette. Status post coronary bypass graft. No pneumothorax or pleural effusion is noted. Right lung is clear. Mild left basilar patchy opacity is noted concerning for  atelectasis or possibly infiltrate. Bony thorax is unremarkable. IMPRESSION: Mild left basilar patchy opacity is noted concerning for atelectasis or possibly infiltrate. Electronically Signed   By: Bradley French M.D.   On: 06/09/2020 13:55   Scheduled Meds: . amLODipine  10 mg Oral Q2000  . aspirin EC  81 mg Oral Q2000  . baricitinib  4 mg Oral Daily  . diclofenac Sodium  2 g Topical Q2000  . dronabinol  2.5 mg Oral BID AC  . enoxaparin (  LOVENOX) injection  40 mg Subcutaneous Q24H  . feeding supplement  237 mL Oral BID BM  . insulin aspart  0-15 Units Subcutaneous TID WC  . Ipratropium-Albuterol  1 puff Inhalation Q6H  . losartan  25 mg Oral Q2000  . methylPREDNISolone (SOLU-MEDROL) injection  40 mg Intravenous Q12H  . metoprolol tartrate  50 mg Oral BID   Continuous Infusions: . remdesivir 100 mg in NS 100 mL Stopped (06/15/20 1009)     LOS: 2 days   Time spent: 49min  Bradley Tracey C Roseland Braun, DO [facility]  If 7PM-7AM, please contact night-coverage [email or web address]  06/16/2020, 7:18 AM

## 2020-06-16 NOTE — Progress Notes (Signed)
Patient currently on HFNC at 13L with NRB. Unable to use CPAP HS at this time due to increased O2 demands.

## 2020-06-16 NOTE — ED Notes (Signed)
Lunch Tray Ordered @ 1037. 

## 2020-06-17 DIAGNOSIS — U071 COVID-19: Secondary | ICD-10-CM | POA: Diagnosis not present

## 2020-06-17 LAB — MAGNESIUM: Magnesium: 2.4 mg/dL (ref 1.7–2.4)

## 2020-06-17 LAB — CBC WITH DIFFERENTIAL/PLATELET
Abs Immature Granulocytes: 0.07 10*3/uL (ref 0.00–0.07)
Basophils Absolute: 0 10*3/uL (ref 0.0–0.1)
Basophils Relative: 0 %
Eosinophils Absolute: 0 10*3/uL (ref 0.0–0.5)
Eosinophils Relative: 0 %
HCT: 47.8 % (ref 39.0–52.0)
Hemoglobin: 16.7 g/dL (ref 13.0–17.0)
Immature Granulocytes: 1 %
Lymphocytes Relative: 8 %
Lymphs Abs: 0.7 10*3/uL (ref 0.7–4.0)
MCH: 31.2 pg (ref 26.0–34.0)
MCHC: 34.9 g/dL (ref 30.0–36.0)
MCV: 89.2 fL (ref 80.0–100.0)
Monocytes Absolute: 0.8 10*3/uL (ref 0.1–1.0)
Monocytes Relative: 9 %
Neutro Abs: 7 10*3/uL (ref 1.7–7.7)
Neutrophils Relative %: 82 %
Platelets: 233 10*3/uL (ref 150–400)
RBC: 5.36 MIL/uL (ref 4.22–5.81)
RDW: 13.7 % (ref 11.5–15.5)
WBC: 8.6 10*3/uL (ref 4.0–10.5)
nRBC: 0 % (ref 0.0–0.2)

## 2020-06-17 LAB — FERRITIN: Ferritin: 1881 ng/mL — ABNORMAL HIGH (ref 24–336)

## 2020-06-17 LAB — COMPREHENSIVE METABOLIC PANEL
ALT: 38 U/L (ref 0–44)
AST: 57 U/L — ABNORMAL HIGH (ref 15–41)
Albumin: 2.8 g/dL — ABNORMAL LOW (ref 3.5–5.0)
Alkaline Phosphatase: 76 U/L (ref 38–126)
Anion gap: 14 (ref 5–15)
BUN: 13 mg/dL (ref 8–23)
CO2: 24 mmol/L (ref 22–32)
Calcium: 8.6 mg/dL — ABNORMAL LOW (ref 8.9–10.3)
Chloride: 104 mmol/L (ref 98–111)
Creatinine, Ser: 0.79 mg/dL (ref 0.61–1.24)
GFR, Estimated: >60 mL/min (ref >60)
Glucose, Bld: 227 mg/dL — ABNORMAL HIGH (ref 70–99)
Potassium: 4 mmol/L (ref 3.5–5.1)
Sodium: 142 mmol/L (ref 135–145)
Total Bilirubin: 0.9 mg/dL (ref 0.3–1.2)
Total Protein: 6.7 g/dL (ref 6.5–8.1)

## 2020-06-17 LAB — CBG MONITORING, ED
Glucose-Capillary: 242 mg/dL — ABNORMAL HIGH (ref 70–99)
Glucose-Capillary: 217 mg/dL — ABNORMAL HIGH (ref 70–99)
Glucose-Capillary: 250 mg/dL — ABNORMAL HIGH (ref 70–99)

## 2020-06-17 LAB — D-DIMER, QUANTITATIVE: D-Dimer, Quant: 1.02 ug/mL-FEU — ABNORMAL HIGH (ref 0.00–0.50)

## 2020-06-17 LAB — GLUCOSE, CAPILLARY: Glucose-Capillary: 242 mg/dL — ABNORMAL HIGH (ref 70–99)

## 2020-06-17 LAB — C-REACTIVE PROTEIN: CRP: 5.3 mg/dL — ABNORMAL HIGH (ref <1.0)

## 2020-06-17 LAB — PHOSPHORUS: Phosphorus: 2.5 mg/dL (ref 2.5–4.6)

## 2020-06-17 NOTE — Progress Notes (Signed)
Patient currently on 15L HFNC, RT currently unable to place patient on CPAP due to increased O2 requirements.

## 2020-06-17 NOTE — ED Notes (Signed)
Lunch Tray Ordered @ 1109. °

## 2020-06-17 NOTE — Progress Notes (Signed)
PROGRESS NOTE    Bradley French  [id number] DOB: 04-14-1941 DOA: 06/20/2020 PCP: [facility]   Brief Narrative:  Bradley French is a 80 y.o. male with medical history significant of CAD s/p CABG in 2006, HTN, IIDM, OSA on CPAP, COPD, presented with cough and increasing short of breath. Patient started to have symptoms of dry cough and shortness of breath 6 days ago, and he went to possible COVID-19 5 days ago.  He also had subjective fever and chills, loss of appetite but no abdominal pain or diarrhea.  He has not been eating or drinking much for the last 3 to 4 days.  He was vaccinated for COVID x2. Went to see his PCP who checked his pulse ox in the office and found low and told him to come to ED. COVID-positive with chest x-ray showed bilateral multifocal infiltrates.  88% on room air, stabilized on 3 L 95%.  Assessment & Plan:   Active Problems:   COVID-19 virus infection   COVID-19  Acute hypoxic restaurant failure secondary to COVID PNA Questionable concurrent COPD exacerbation -Continue remdesivir, baricitinib, steroids -Wean down O2 as tolerated - stable today - clinically feels markedly iimproved SpO2: 90 % O2 Flow Rate (L/min): 15 L/min FiO2 (%): 100 % -Inhalers as needed - avoid nebulizers given covid + status Recent Labs    06/06/2020 1740 06/15/20 0335 06/15/20 0336 06/16/20 0445 06/17/20 0418  DDIMER 1.08* 0.89*  --  0.83* 1.02*  FERRITIN 1,883*  --  1,923* 2,267* 1,881*  LDH 403*  --   --   --   --   CRP 15.6*  --  17.3* 10.1* 5.3*   Severe dehydration without creatinine elevation, POA - Completed IVF - increase PO intake as tolerated - Likely 2/2 above  Deconditioning, ambulatory dysfunction, POA Failure to thrive - Continue dietary supplement, trial of Marinol - PT evaluation  CAD status post CABG -similar chronic ST-T changes on III and aVF. -No chest pain, Trop sent  HTN -Continue home meds, hold Lasix for dehydration  DVT  prophylaxis: Lovenox  Code Status: Full Code Family Communication: Patient to update  Status is: Inpt  Dispo: The patient is from: Home              Anticipated d/c is to: Home              Anticipated d/c date is: 48 to 72 hours              Patient currently not medically stable for discharge  Consultants:   None  Procedures:   None  Antimicrobials:  Remdesivir  Subjective: No acute issues or events overnight, respiratory status stabilizing - feels moderately better compared to admission.  Denies nausea, vomiting, diarrhea, constipation, headache, fevers, chills.  Objective: Vitals:   06/17/20 1530 06/17/20 1545 06/17/20 1600 06/17/20 1630  BP:   (!) 102/44   Pulse: 61 (!) 58 74 69  Resp: (!) 32 (!) 24 (!) 31 (!) 23  Temp:      TempSrc:      SpO2: (!) 86% (!) 88% (!) 86% 90%  Weight:      Height:       No intake or output data in the 24 hours ending 06/17/20 1729 Filed Weights   06/27/2020 1039  Weight: 108.9 kg    Examination:  General exam: Appears calm and comfortable  Respiratory system: Diminished bilaterally - without overt wheeze/rales. Cardiovascular system: S1 & S2 heard, RRR. No  JVD, murmurs, rubs, gallops or clicks. No pedal edema. Gastrointestinal system: Abdomen is nondistended, soft and nontender. No organomegaly or masses felt. Normal bowel sounds heard. Central nervous system: Alert and oriented. No focal neurological deficits. Extremities: Symmetric 5 x 5 power. Skin: No rashes, lesions or ulcers Psychiatry: Judgement and insight appear normal. Mood & affect appropriate.    Data Reviewed: I have personally reviewed following labs and imaging studies  CBC: Recent Labs  Lab 06/26/2020 1045 06/15/20 0335 06/16/20 0445 06/17/20 0418  WBC 5.6 4.3 4.8 8.6  NEUTROABS  --  3.5 3.4 7.0  HGB 16.7 15.9 17.4* 16.7  HCT 50.7 47.6 49.2 47.8  MCV 90.7 90.3 88.6 89.2  PLT 138* 126* 208 962   Basic Metabolic Panel: Recent Labs  Lab 2020/06/26 1045  06-26-2020 1740 06/15/20 0335 06/16/20 0445 06/17/20 0418  NA 136  --  139 138 142  K 3.3*  --  4.0 4.2 4.0  CL 99  --  104 102 104  CO2 21*  --  23 22 24   GLUCOSE 96  --  144* 189* 227*  BUN 9  --  9 21 13   CREATININE 1.15  --  0.82 1.07 0.79  CALCIUM 8.2*  --  7.7* 8.2* 8.6*  MG  --  2.2 1.8 2.3 2.4  PHOS  --   --  2.5 2.9 2.5   GFR: Estimated Creatinine Clearance: 102.9 mL/min (by C-G formula based on SCr of 0.79 mg/dL). Liver Function Tests: Recent Labs  Lab 06-26-2020 1045 06/15/20 0335 06/16/20 0445 06/17/20 0418  AST 77* 69* 69* 57*  ALT 35 33 38 38  ALKPHOS 49 51 65 76  BILITOT 1.6* 1.1 1.6* 0.9  PROT 6.8 6.1* 7.2 6.7  ALBUMIN 3.1* 2.7* 3.0* 2.8*   No results for input(s): LIPASE, AMYLASE in the last 168 hours. No results for input(s): AMMONIA in the last 168 hours. Coagulation Profile: No results for input(s): INR, PROTIME in the last 168 hours. Cardiac Enzymes: No results for input(s): CKTOTAL, CKMB, CKMBINDEX, TROPONINI in the last 168 hours. BNP (last 3 results) No results for input(s): PROBNP in the last 8760 hours. HbA1C: No results for input(s): HGBA1C in the last 72 hours. CBG: Recent Labs  Lab 06/16/20 0802 06/16/20 1200 06/16/20 1751 06/17/20 0809 06/17/20 1125  GLUCAP 184* 167* 223* 242* 217*   Lipid Profile: Recent Labs    2020/06/26 1830  TRIG 135   Thyroid Function Tests: No results for input(s): TSH, T4TOTAL, FREET4, T3FREE, THYROIDAB in the last 72 hours. Anemia Panel: Recent Labs    06/16/20 0445 06/17/20 0418  FERRITIN 2,267* 1,881*   Sepsis Labs: Recent Labs  Lab Jun 26, 2020 1740 June 26, 2020 1820  PROCALCITON 0.22  --   LATICACIDVEN  --  1.6    No results found for this or any previous visit (from the past 240 hour(s)).   Radiology Studies: No results found. Scheduled Meds: . amLODipine  10 mg Oral Q2000  . aspirin EC  81 mg Oral Q2000  . baricitinib  4 mg Oral Daily  . diclofenac Sodium  2 g Topical Q2000  .  dronabinol  2.5 mg Oral BID AC  . enoxaparin (LOVENOX) injection  40 mg Subcutaneous Q24H  . feeding supplement  237 mL Oral BID BM  . insulin aspart  0-15 Units Subcutaneous TID WC  . Ipratropium-Albuterol  1 puff Inhalation Q6H  . losartan  25 mg Oral Q2000  . methylPREDNISolone (SOLU-MEDROL) injection  40 mg Intravenous Q12H  .  metoprolol tartrate  50 mg Oral BID   Continuous Infusions: . remdesivir 100 mg in NS 100 mL Stopped (06/16/20 1147)     LOS: 3 days   Time spent: 64min  Merina Behrendt C Roxane Puerto, DO [facility]  If 7PM-7AM, please contact night-coverage [email or web address]  06/17/2020, 5:29 PM

## 2020-06-17 NOTE — ED Notes (Signed)
Breakfast Ordered 

## 2020-06-17 NOTE — ED Notes (Signed)
Linen change provided for patient, assisted with hygiene as well. Patient able to get up and move off of bed and back down once linen is changed. Denies difficulty with gait, or any respiratory complaint. Noted Sp02 decreased to 70s with min. activity, Upon rest, sitting up in bed, Sp02 improves to 92%, patient alert and oriented, inquires about average oxygen level measurement, addressed patient's concerns. Able to finish sentence and make needs known without difficulty. Denies pain when asked.

## 2020-06-18 DIAGNOSIS — U071 COVID-19: Secondary | ICD-10-CM | POA: Diagnosis not present

## 2020-06-18 LAB — COMPREHENSIVE METABOLIC PANEL
ALT: 40 U/L (ref 0–44)
AST: 38 U/L (ref 15–41)
Albumin: 2.8 g/dL — ABNORMAL LOW (ref 3.5–5.0)
Alkaline Phosphatase: 94 U/L (ref 38–126)
Anion gap: 11 (ref 5–15)
BUN: 11 mg/dL (ref 8–23)
CO2: 27 mmol/L (ref 22–32)
Calcium: 8.7 mg/dL — ABNORMAL LOW (ref 8.9–10.3)
Chloride: 104 mmol/L (ref 98–111)
Creatinine, Ser: 0.79 mg/dL (ref 0.61–1.24)
GFR, Estimated: >60 mL/min (ref >60)
Glucose, Bld: 227 mg/dL — ABNORMAL HIGH (ref 70–99)
Potassium: 3.8 mmol/L (ref 3.5–5.1)
Sodium: 142 mmol/L (ref 135–145)
Total Bilirubin: 1.6 mg/dL — ABNORMAL HIGH (ref 0.3–1.2)
Total Protein: 6.5 g/dL (ref 6.5–8.1)

## 2020-06-18 LAB — GLUCOSE, CAPILLARY
Glucose-Capillary: 232 mg/dL — ABNORMAL HIGH (ref 70–99)
Glucose-Capillary: 229 mg/dL — ABNORMAL HIGH (ref 70–99)
Glucose-Capillary: 209 mg/dL — ABNORMAL HIGH (ref 70–99)
Glucose-Capillary: 292 mg/dL — ABNORMAL HIGH (ref 70–99)
Glucose-Capillary: 211 mg/dL — ABNORMAL HIGH (ref 70–99)

## 2020-06-18 LAB — CBC WITH DIFFERENTIAL/PLATELET
Abs Immature Granulocytes: 0.12 10*3/uL — ABNORMAL HIGH (ref 0.00–0.07)
Basophils Absolute: 0 10*3/uL (ref 0.0–0.1)
Basophils Relative: 0 %
Eosinophils Absolute: 0 10*3/uL (ref 0.0–0.5)
Eosinophils Relative: 0 %
HCT: 48 % (ref 39.0–52.0)
Hemoglobin: 16.3 g/dL (ref 13.0–17.0)
Immature Granulocytes: 2 %
Lymphocytes Relative: 7 %
Lymphs Abs: 0.5 10*3/uL — ABNORMAL LOW (ref 0.7–4.0)
MCH: 30.4 pg (ref 26.0–34.0)
MCHC: 34 g/dL (ref 30.0–36.0)
MCV: 89.4 fL (ref 80.0–100.0)
Monocytes Absolute: 0.8 10*3/uL (ref 0.1–1.0)
Monocytes Relative: 13 %
Neutro Abs: 4.9 10*3/uL (ref 1.7–7.7)
Neutrophils Relative %: 78 %
Platelets: 238 10*3/uL (ref 150–400)
RBC: 5.37 MIL/uL (ref 4.22–5.81)
RDW: 13.4 % (ref 11.5–15.5)
WBC: 6.3 10*3/uL (ref 4.0–10.5)
nRBC: 0 % (ref 0.0–0.2)

## 2020-06-18 LAB — PHOSPHORUS: Phosphorus: 1.8 mg/dL — ABNORMAL LOW (ref 2.5–4.6)

## 2020-06-18 LAB — FERRITIN: Ferritin: 1381 ng/mL — ABNORMAL HIGH (ref 24–336)

## 2020-06-18 LAB — C-REACTIVE PROTEIN: CRP: 2.7 mg/dL — ABNORMAL HIGH (ref <1.0)

## 2020-06-18 LAB — D-DIMER, QUANTITATIVE: D-Dimer, Quant: 2.16 ug/mL-FEU — ABNORMAL HIGH (ref 0.00–0.50)

## 2020-06-18 LAB — MAGNESIUM: Magnesium: 2.2 mg/dL (ref 1.7–2.4)

## 2020-06-18 MED ORDER — IPRATROPIUM-ALBUTEROL 20-100 MCG/ACT IN AERS
1.0000 | INHALATION_SPRAY | Freq: Four times a day (QID) | RESPIRATORY_TRACT | Status: DC | PRN
  Administered 2020-06-25 – 2020-06-29 (×2): 1 via RESPIRATORY_TRACT
  Filled 2020-06-18: qty 4

## 2020-06-18 MED ORDER — IPRATROPIUM-ALBUTEROL 20-100 MCG/ACT IN AERS
1.0000 | INHALATION_SPRAY | Freq: Three times a day (TID) | RESPIRATORY_TRACT | Status: DC
  Administered 2020-06-19 – 2020-06-29 (×33): 1 via RESPIRATORY_TRACT
  Filled 2020-06-18: qty 4

## 2020-06-18 NOTE — Progress Notes (Signed)
PROGRESS NOTE    Bradley French  [id number] DOB: [birth year] DOA: 06/06/2020 PCP: [facility]   Brief Narrative:  Bradley French is a 80 y.o. male with medical history significant of CAD s/p CABG in 2006, HTN, IIDM, OSA on CPAP, COPD, presented with cough and increasing short of breath. Patient started to have symptoms of dry cough and shortness of breath 6 days ago, and he went to possible COVID-19 5 days ago.  He also had subjective fever and chills, loss of appetite but no abdominal pain or diarrhea.  He has not been eating or drinking much for the last 3 to 4 days.  He was vaccinated for COVID x 2. Went to see his PCP who checked his pulse ox in the office and found low and told him to come to ED. COVID-positive with chest x-ray showed bilateral multifocal infiltrates. 88% on room air, stabilized on 3L 95%.  Assessment & Plan:  Acute hypoxic restaurant failure secondary to COVID PNA Questionable concurrent COPD exacerbation - Continue remdesivir, baricitinib, steroids - Wean down O2 as tolerated - stable today - clinically feels better despite hypoxia; wean oxygen - goals 85-90% SpO2: 94 % O2 Flow Rate (L/min): 12 L/min FiO2 (%): 100 % Recent Labs    06/16/20 0445 06/17/20 0418 06/18/20 0409  DDIMER 0.83* 1.02* 2.16*  FERRITIN 2,267* 1,881* 1,381*  CRP 10.1* 5.3* 2.7*   Severe dehydration without creatinine elevation, POA - Completed IVF - increase PO intake as tolerated - Likely 2/2 above  Deconditioning, ambulatory dysfunction, POA Failure to thrive - Continue dietary supplement, trial of Marinol - PT evaluation  CAD status post CABG - Stable  HTN - Continue home meds, hold Lasix for dehydration  DVT prophylaxis: Lovenox  Code Status: Full Code Family Communication: Patient to update  Status is: Inpt  Dispo: The patient is from: Home              Anticipated d/c is to: Home              Anticipated d/c date is: >72 hours               Patient currently not medically stable for discharge  Consultants:   None  Procedures:   None  Antimicrobials:  Remdesivir  Subjective: No acute issues or events overnight, respiratory status improving slightly, patient clinically feels markedly improved since admission, still complains of dyspnea with exertion but at rest feels close to baseline.  Denies nausea, vomiting, diarrhea, constipation, headache, fevers, chills.  Continues to endorse poor appetite as well.  Objective: Vitals:   06/17/20 2117 06/18/20 0645 06/18/20 1000 06/18/20 1225  BP: (!) 115/49 109/70    Pulse: 80 78    Resp: 20 20    Temp: 98.8 F (37.1 C) 98.2 F (36.8 C)    TempSrc: Oral Oral    SpO2: 94% 93% 90% 94%  Weight:      Height:        Intake/Output Summary (Last 24 hours) at 06/18/2020 1424 Last data filed at 06/18/2020 1413 Gross per 24 hour  Intake 644.49 ml  Output 850 ml  Net -205.51 ml   Filed Weights   06/11/2020 1039  Weight: 108.9 kg    Examination:  General exam: Appears calm and comfortable  Respiratory system: Diminished bilaterally - without overt wheeze/rales. Cardiovascular system: S1 & S2 heard, RRR. No JVD, murmurs, rubs, gallops or clicks. No pedal edema. Gastrointestinal system: Abdomen is nondistended, soft and nontender.  No organomegaly or masses felt. Normal bowel sounds heard. Central nervous system: Alert and oriented. No focal neurological deficits. Extremities: Symmetric 5 x 5 power. Skin: No rashes, lesions or ulcers Psychiatry: Judgement and insight appear normal. Mood & affect appropriate.    Data Reviewed: I have personally reviewed following labs and imaging studies  CBC: Recent Labs  Lab 07/11/2020 1045 06/15/20 0335 06/16/20 0445 06/17/20 0418 06/18/20 0409  WBC 5.6 4.3 4.8 8.6 6.3  NEUTROABS  --  3.5 3.4 7.0 4.9  HGB 16.7 15.9 17.4* 16.7 16.3  HCT 50.7 47.6 49.2 47.8 48.0  MCV 90.7 90.3 88.6 89.2 89.4  PLT 138* 126* 208 233 834   Basic  Metabolic Panel: Recent Labs  Lab 07/11/2020 1045 07-11-2020 1740 06/15/20 0335 06/16/20 0445 06/17/20 0418 06/18/20 0409  NA 136  --  139 138 142 142  K 3.3*  --  4.0 4.2 4.0 3.8  CL 99  --  104 102 104 104  CO2 21*  --  23 22 24 27   GLUCOSE 96  --  144* 189* 227* 227*  BUN 9  --  9 21 13 11   CREATININE 1.15  --  0.82 1.07 0.79 0.79  CALCIUM 8.2*  --  7.7* 8.2* 8.6* 8.7*  MG  --  2.2 1.8 2.3 2.4 2.2  PHOS  --   --  2.5 2.9 2.5 1.8*   GFR: Estimated Creatinine Clearance: 102.9 mL/min (by C-G formula based on SCr of 0.79 mg/dL). Liver Function Tests: Recent Labs  Lab 2020/07/11 1045 06/15/20 0335 06/16/20 0445 06/17/20 0418 06/18/20 0409  AST 77* 69* 69* 57* 38  ALT 35 33 38 38 40  ALKPHOS 49 51 65 76 94  BILITOT 1.6* 1.1 1.6* 0.9 1.6*  PROT 6.8 6.1* 7.2 6.7 6.5  ALBUMIN 3.1* 2.7* 3.0* 2.8* 2.8*   No results for input(s): LIPASE, AMYLASE in the last 168 hours. No results for input(s): AMMONIA in the last 168 hours. Coagulation Profile: No results for input(s): INR, PROTIME in the last 168 hours. Cardiac Enzymes: No results for input(s): CKTOTAL, CKMB, CKMBINDEX, TROPONINI in the last 168 hours. BNP (last 3 results) No results for input(s): PROBNP in the last 8760 hours. HbA1C: No results for input(s): HGBA1C in the last 72 hours. CBG: Recent Labs  Lab 06/17/20 1125 06/17/20 1729 06/17/20 2115 06/18/20 0817 06/18/20 1202  GLUCAP 217* 250* 242* 232* 229*   Lipid Profile: No results for input(s): CHOL, HDL, LDLCALC, TRIG, CHOLHDL, LDLDIRECT in the last 72 hours. Thyroid Function Tests: No results for input(s): TSH, T4TOTAL, FREET4, T3FREE, THYROIDAB in the last 72 hours. Anemia Panel: Recent Labs    06/17/20 0418 06/18/20 0409  FERRITIN 1,881* 1,381*   Sepsis Labs: Recent Labs  Lab 11-Jul-2020 1740 Jul 11, 2020 1820  PROCALCITON 0.22  --   LATICACIDVEN  --  1.6    No results found for this or any previous visit (from the past 240 hour(s)).   Radiology  Studies: No results found. Scheduled Meds: . amLODipine  10 mg Oral Q2000  . aspirin EC  81 mg Oral Q2000  . baricitinib  4 mg Oral Daily  . diclofenac Sodium  2 g Topical Q2000  . dronabinol  2.5 mg Oral BID AC  . enoxaparin (LOVENOX) injection  40 mg Subcutaneous Q24H  . feeding supplement  237 mL Oral BID BM  . insulin aspart  0-15 Units Subcutaneous TID WC  . Ipratropium-Albuterol  1 puff Inhalation Q6H  . losartan  25  mg Oral Q2000  . methylPREDNISolone (SOLU-MEDROL) injection  40 mg Intravenous Q12H  . metoprolol tartrate  50 mg Oral BID   Continuous Infusions: . remdesivir 100 mg in NS 100 mL Stopped (06/18/20 0931)     LOS: 4 days   Time spent: 55min  Siddhant Hashemi C Ediel Unangst, DO [facility]  If 7PM-7AM, please contact night-coverage [email or web address]  06/18/2020, 2:24 PM

## 2020-06-18 NOTE — Progress Notes (Signed)
Entered patient room for safety rounds. Patient had removed O2 and pulse ox. Reapplied pulse ox and O2 and patient O2 saturation was low 70's. Increased O2 to 15L HFNC. Patient educated to not remove O2 and pulse ox because he is currently dependent on supplemental oxygen.

## 2020-06-18 NOTE — Progress Notes (Signed)
Physical Therapy Treatment Patient Details Name: UNNAMED ZEIEN MRN: [id number] DOB: 04/12/1941 Today's Date: 06/18/2020    History of Present Illness 80 yo male with onset of SOB and cough.  Has been home with his wife, had poor intake and feeling bad.  PMHx:  CABG, HTN, DM, OSA, CPAP, COPD, CAD, anxiety, cataracts, colon polyps, depression, ulcer,    PT Comments    Pt continues to have limited participation due to drop in Spo2 with movement. Pt with decreased tolerance for activity requiring an increase in o2. Pt able to perform sit<>stand and maintain balance while pulling up pants with min guard. Pt continues to be limited by endurance, safety and gait and will benefit from skilled PT to address deficits to maximize independence with functional mobility prior to discharge.     Follow Up Recommendations  SNF vs Home health PT (pending progress with PT);Supervision/Assistance - 24 hour     Equipment Recommendations  Other (comment) (could need rollator pending tolerance for ambulation and need for seated rest break)    Recommendations for Other Services       Precautions / Restrictions Precautions Precautions: Fall Precaution Comments: monitor HR and sats Restrictions Weight Bearing Restrictions: No Other Position/Activity Restrictions: consider height of head re sats    Mobility  Bed Mobility                  Transfers Overall transfer level: Needs assistance Equipment used: None Transfers: Sit to/from Stand Sit to Stand: Min guard         General transfer comment: donned pants sitting EOB and stood up to pull up with o2 dropping to 81% on 12 L. pt returned to sitting wtih O2 increased to 15 L due to Spo2 not improving, following 2 minutes sittin SpO2 improved to 90% and o2 returned to 12 L, RN notified  Ambulation/Gait                 Stairs             Wheelchair Mobility    Modified Rankin (Stroke Patients Only)       Balance      Sitting balance-Leahy Scale: Fair                                      Cognition                                              Exercises      General Comments General comments (skin integrity, edema, etc.): donned pants sitting EOB and stood up to pull up with o2 dropping to 81% on 12 L. pt returned to sitting wtih O2 increased to 15 L due to Spo2 not improving, following 2 minutes sittin SpO2 improved to 90% and o2 returned to 12 L, RN notified      Pertinent Vitals/Pain      Home Living                      Prior Function            PT Goals (current goals can now be found in the care plan section) Acute Rehab PT Goals Patient Stated Goal: to get home and feel better PT Goal Formulation: With  patient Time For Goal Achievement: 06/29/20 Potential to Achieve Goals: Good Progress towards PT goals: Progressing toward goals    Frequency    Min 3X/week      PT Plan Current plan remains appropriate    Co-evaluation              AM-PAC PT "6 Clicks" Mobility   Outcome Measure  Help needed turning from your back to your side while in a flat bed without using bedrails?: A Little Help needed moving from lying on your back to sitting on the side of a flat bed without using bedrails?: A Little Help needed moving to and from a bed to a chair (including a wheelchair)?: A Little Help needed standing up from a chair using your arms (e.g., wheelchair or bedside chair)?: A Little Help needed to walk in hospital room?: A Little Help needed climbing 3-5 steps with a railing? : A Lot 6 Click Score: 17    End of Session Equipment Utilized During Treatment: Oxygen;Gait belt Activity Tolerance: Treatment limited secondary to medical complications (Comment) Patient left: in bed;with call bell/phone within reach Nurse Communication: Mobility status;Other (comment) PT Visit Diagnosis: Unsteadiness on feet (R26.81);Muscle weakness  (generalized) (M62.81)     Time: 9485-4627 PT Time Calculation (min) (ACUTE ONLY): 13 min  Charges:  $Therapeutic Activity: 8-22 mins                     Lyanne Co, DPT Acute Rehabilitation Services [phone]   [place] 06/18/2020, 12:29 PM

## 2020-06-18 NOTE — NC FL2 (Signed)
[place] MEDICAID FL2 LEVEL OF CARE SCREENING TOOL     IDENTIFICATION  Patient Name: Bradley French Birthdate: February 10, 1941 Sex: male Admission Date (Current Location): June 28, 2020  [facility] and [place] Number:  Herbalist and Address:  The [place], [place] [address] [place], [place] [address]      Provider Number: [id number]  Attending Physician Name and Address:  Little Ishikawa, MD  Relative Name and Phone Number:  MICHELE, KERLIN [phone]  [phone]    Current Level of Care: Hospital Recommended Level of Care: [place] Prior Approval Number:    Date Approved/Denied:   PASRR Number: [id number]  Discharge Plan: SNF    Current Diagnoses: Patient Active Problem List   Diagnosis Date Noted  . COVID-19 virus infection 06-28-20  . COVID-19 06-28-2020  . Traumatic arthritis of knee 11/05/2015  . Leg weakness 10/03/2013  . Lower extremity weakness 10/03/2013  . Abnormal MRI, spinal cord 10/03/2013  . OSA on CPAP   . Lower GI bleed 06/20/2012  . DYSLIPIDEMIA 08/06/2007  . TOBACCO ABUSE 08/06/2007  . CORONARY ARTERY DISEASE 08/06/2007  . COPD 08/06/2007  . DIVERTICULOSIS, COLON 08/06/2007  . GASTRIC ULCER, HX OF 08/06/2007  . HEMORRHOIDS, HX OF 08/06/2007  . CORONARY ARTERY BYPASS GRAFT, TWO VESSEL, HX OF 08/06/2007  . Other postprocedural status(V45.89) 08/06/2007    Orientation RESPIRATION BLADDER Height & Weight     Self,Time,Situation,Place  O2 Continent Weight: 240 lb (108.9 kg) Height:  6\' 4"  (193 cm)  BEHAVIORAL SYMPTOMS/MOOD NEUROLOGICAL BOWEL NUTRITION STATUS      Continent Diet (heart healthy/carb modified)  AMBULATORY STATUS COMMUNICATION OF NEEDS Skin   Limited Assist Verbally Normal                       Personal Care Assistance Level of Assistance  Bathing,Feeding,Dressing Bathing Assistance: Limited assistance Feeding assistance: Limited assistance Dressing Assistance:  Limited assistance     Functional Limitations Info  Sight,Hearing,Speech Sight Info: Adequate Hearing Info: Adequate Speech Info: Adequate    SPECIAL CARE FACTORS FREQUENCY  PT (By licensed PT)     PT Frequency: 5x week              Contractures Contractures Info: Not present    Additional Factors Info  Code Status,Allergies Code Status Info: full Allergies Info: sulfonamide derivatives           Current Medications (06/18/2020):  This is the current hospital active medication list Current Facility-Administered Medications  Medication Dose Route Frequency Provider Last Rate Last Admin  . acetaminophen (TYLENOL) tablet 650 mg  650 mg Oral Q6H PRN Lequita Halt, MD   650 mg at 06/18/20 0829  . amLODipine (NORVASC) tablet 10 mg  10 mg Oral Q2000 Wynetta Fines T, MD   10 mg at 06/16/20 2222  . aspirin EC tablet 81 mg  81 mg Oral Q2000 Lequita Halt, MD   81 mg at 06/17/20 2131  . baricitinib (OLUMIANT) tablet 4 mg  4 mg Oral Daily Wynetta Fines T, MD   4 mg at 06/18/20 0829  . diclofenac Sodium (VOLTAREN) 1 % topical gel 2 g  2 g Topical Q2000 Wynetta Fines T, MD   2 g at 06-28-2020 2311  . dronabinol (MARINOL) capsule 2.5 mg  2.5 mg Oral BID AC Wynetta Fines T, MD   2.5 mg at 06/18/20 1256  . enoxaparin (LOVENOX) injection 40 mg  40 mg Subcutaneous Q24H Zhang,  Ralene Cork, MD   40 mg at 06/17/20 2132  . feeding supplement (ENSURE ENLIVE / ENSURE PLUS) liquid 237 mL  237 mL Oral BID BM Wynetta Fines T, MD   237 mL at 06/18/20 1257  . guaiFENesin-dextromethorphan (ROBITUSSIN DM) 100-10 MG/5ML syrup 10 mL  10 mL Oral Q4H PRN Wynetta Fines T, MD   10 mL at 06/18/20 0829  . insulin aspart (novoLOG) injection 0-15 Units  0-15 Units Subcutaneous TID WC Lequita Halt, MD   5 Units at 06/18/20 1257  . Ipratropium-Albuterol (COMBIVENT) respimat 1 puff  1 puff Inhalation Q6H Lequita Halt, MD   1 puff at 06/18/20 1258  . losartan (COZAAR) tablet 25 mg  25 mg Oral Q2000 Wynetta Fines T, MD   25 mg at  06/16/20 2223  . methylPREDNISolone sodium succinate (SOLU-MEDROL) 40 mg/mL injection 40 mg  40 mg Intravenous Q12H Little Ishikawa, MD   40 mg at 06/18/20 0829  . metoprolol tartrate (LOPRESSOR) tablet 50 mg  50 mg Oral BID Wynetta Fines T, MD   50 mg at 06/18/20 0829  . ondansetron (ZOFRAN) tablet 4 mg  4 mg Oral Q6H PRN Wynetta Fines T, MD       Or  . ondansetron [facility]) injection 4 mg  4 mg Intravenous Q6H PRN Wynetta Fines T, MD      . remdesivir 100 mg in sodium chloride 0.9 % 100 mL IVPB  100 mg Intravenous Daily Noemi Chapel, MD   Stopped at 06/18/20 [phone]     Discharge Medications: Please see discharge summary for a list of discharge medications.  Relevant Imaging Results:  Relevant Lab Results:   Additional Information SSN:  [id number]  Joanne Chars, LCSW

## 2020-06-18 NOTE — TOC Initial Note (Addendum)
Transition of Care [facility]) - Initial/Assessment Note    Patient Details  Name: Bradley French MRN: [id number] Date of Birth: 1940/07/30  [facility]) CM/SW Contact:    Joanne Chars, LCSW Phone Number: 06/18/2020, 3:45 PM  Clinical Narrative:  CSW spoke with pt by phone due to pt being covid positive.  Pt agrees to SNF plan at discharge.  Permission given to speak with son, with wife, and to refer to SNF.  Pt is vaccinated for covid, but not boosted.  Pt reports he goes to [place].     1600: CSW spoke with [employer].  Pt PCP there is Dr Sherral Hammers.  CSW is [place], [phone].  Message left with Marquita regarding SNF auth and asking what SNF facilities are accepting covid positive patients.               Expected Discharge Plan: Skilled Nursing Facility Barriers to Discharge: Continued Medical Work up,SNF Pending bed offer   Patient Goals and CMS Choice Patient states their goals for this hospitalization and ongoing recovery are:: "get back to walking" CMS Medicare.gov Compare Post Acute Care list provided to::  ([place] patient)    Expected Discharge Plan and Services Expected Discharge Plan: [place] Choice: [place] arrangements for the past 2 months: Single Family Home                                      Prior Living Arrangements/Services Living arrangements for the past 2 months: Single Family Home Lives with:: Spouse Patient language and need for interpreter reviewed:: Yes        Need for Family Participation in Patient Care: Yes (Comment) Care giver support system in place?: Yes (comment) Current home services: Other (comment) (none) Criminal Activity/Legal Involvement Pertinent to Current Situation/Hospitalization: No - Comment as needed  Activities of Daily Living      Permission Sought/Granted Permission sought to share information with : Family Archivist (comment) ([facility] [place]) Permission granted to share information with : Yes, Verbal Permission Granted  Share Information with NAME: wife Blanch Media, son [place]  Permission granted to share info w AGENCY: [facility], SNF        Emotional Assessment Appearance:: Other (Comment Required (unknown-phone assessment due to Covid) Attitude/Demeanor/Rapport: Engaged Affect (typically observed): Appropriate,Pleasant Orientation: : Oriented to Self,Oriented to Place,Oriented to  Time,Oriented to Situation Alcohol / Substance Use: Not Applicable Psych Involvement: No (comment)  Admission diagnosis:  COVID-19 [U07.1] Patient Active Problem List   Diagnosis Date Noted  . COVID-19 virus infection 06/17/2020  . COVID-19 06/19/2020  . Traumatic arthritis of knee 11/05/2015  . Leg weakness 10/03/2013  . Lower extremity weakness 10/03/2013  . Abnormal MRI, spinal cord 10/03/2013  . OSA on CPAP   . Lower GI bleed 06/20/2012  . DYSLIPIDEMIA 08/06/2007  . TOBACCO ABUSE 08/06/2007  . CORONARY ARTERY DISEASE 08/06/2007  . COPD 08/06/2007  . DIVERTICULOSIS, COLON 08/06/2007  . GASTRIC ULCER, HX OF 08/06/2007  . HEMORRHOIDS, HX OF 08/06/2007  . CORONARY ARTERY BYPASS GRAFT, TWO VESSEL, HX OF 08/06/2007  . Other postprocedural status(V45.89) 08/06/2007   PCP:  Clinic, [place]:   [id number] [place], [address] [place] [address] Phone: [phone] Fax: [place], [place] - [address] [facility] [place] [address] Phone: [phone] Fax: [phone]     Social Determinants of Health (SDOH) Interventions    Readmission Risk Interventions No flowsheet data found.

## 2020-06-19 DIAGNOSIS — U071 COVID-19: Secondary | ICD-10-CM | POA: Diagnosis not present

## 2020-06-19 LAB — CBC WITH DIFFERENTIAL/PLATELET
Abs Immature Granulocytes: 0.11 10*3/uL — ABNORMAL HIGH (ref 0.00–0.07)
Basophils Absolute: 0 10*3/uL (ref 0.0–0.1)
Basophils Relative: 0 %
Eosinophils Absolute: 0 10*3/uL (ref 0.0–0.5)
Eosinophils Relative: 0 %
HCT: 46.7 % (ref 39.0–52.0)
Hemoglobin: 15.8 g/dL (ref 13.0–17.0)
Immature Granulocytes: 2 %
Lymphocytes Relative: 7 %
Lymphs Abs: 0.4 10*3/uL — ABNORMAL LOW (ref 0.7–4.0)
MCH: 30 pg (ref 26.0–34.0)
MCHC: 33.8 g/dL (ref 30.0–36.0)
MCV: 88.8 fL (ref 80.0–100.0)
Monocytes Absolute: 0.8 10*3/uL (ref 0.1–1.0)
Monocytes Relative: 13 %
Neutro Abs: 5.2 10*3/uL (ref 1.7–7.7)
Neutrophils Relative %: 78 %
Platelets: 276 10*3/uL (ref 150–400)
RBC: 5.26 MIL/uL (ref 4.22–5.81)
RDW: 13.2 % (ref 11.5–15.5)
WBC: 6.5 10*3/uL (ref 4.0–10.5)
nRBC: 0 % (ref 0.0–0.2)

## 2020-06-19 LAB — COMPREHENSIVE METABOLIC PANEL
ALT: 35 U/L (ref 0–44)
AST: 24 U/L (ref 15–41)
Albumin: 2.8 g/dL — ABNORMAL LOW (ref 3.5–5.0)
Alkaline Phosphatase: 88 U/L (ref 38–126)
Anion gap: 10 (ref 5–15)
BUN: 15 mg/dL (ref 8–23)
CO2: 27 mmol/L (ref 22–32)
Calcium: 8.5 mg/dL — ABNORMAL LOW (ref 8.9–10.3)
Chloride: 104 mmol/L (ref 98–111)
Creatinine, Ser: 0.86 mg/dL (ref 0.61–1.24)
GFR, Estimated: >60 mL/min (ref >60)
Glucose, Bld: 222 mg/dL — ABNORMAL HIGH (ref 70–99)
Potassium: 3.9 mmol/L (ref 3.5–5.1)
Sodium: 141 mmol/L (ref 135–145)
Total Bilirubin: 1.5 mg/dL — ABNORMAL HIGH (ref 0.3–1.2)
Total Protein: 6 g/dL — ABNORMAL LOW (ref 6.5–8.1)

## 2020-06-19 LAB — GLUCOSE, CAPILLARY
Glucose-Capillary: 186 mg/dL — ABNORMAL HIGH (ref 70–99)
Glucose-Capillary: 279 mg/dL — ABNORMAL HIGH (ref 70–99)
Glucose-Capillary: 233 mg/dL — ABNORMAL HIGH (ref 70–99)
Glucose-Capillary: 235 mg/dL — ABNORMAL HIGH (ref 70–99)

## 2020-06-19 LAB — C-REACTIVE PROTEIN: CRP: 1.3 mg/dL — ABNORMAL HIGH (ref <1.0)

## 2020-06-19 LAB — FERRITIN: Ferritin: 1127 ng/mL — ABNORMAL HIGH (ref 24–336)

## 2020-06-19 LAB — PHOSPHORUS: Phosphorus: 2.9 mg/dL (ref 2.5–4.6)

## 2020-06-19 LAB — D-DIMER, QUANTITATIVE: D-Dimer, Quant: 8.07 ug/mL-FEU — ABNORMAL HIGH (ref 0.00–0.50)

## 2020-06-19 LAB — MAGNESIUM: Magnesium: 2.2 mg/dL (ref 1.7–2.4)

## 2020-06-19 MED ORDER — SODIUM CHLORIDE 0.9 % IV SOLN
100.0000 mg | Freq: Once | INTRAVENOUS | Status: AC
  Administered 2020-06-19: 100 mg via INTRAVENOUS
  Filled 2020-06-19: qty 20

## 2020-06-19 NOTE — Progress Notes (Signed)
PROGRESS NOTE    FIELDS OROS  NAT:[id number] DOB: 06-27-1940 DOA: 06/26/2020 PCP: [facility], [facility]   Brief Narrative:  Bradley French is a 80 y.o. male with medical history significant of CAD s/p CABG in 2006, HTN, IIDM, OSA on CPAP, COPD, presented with cough and increasing short of breath. Patient started to have symptoms of dry cough and shortness of breath 6 days ago, and he went to possible COVID-19 5 days ago.  He also had subjective fever and chills, loss of appetite but no abdominal pain or diarrhea.  He has not been eating or drinking much for the last 3 to 4 days.  He was vaccinated for COVID x 2. Went to see his PCP who checked his pulse ox in the office and found low and told him to come to ED. COVID-positive with chest x-ray showed bilateral multifocal infiltrates. 88% on room air, stabilized on 3L 95%.  Assessment & Plan:  Acute hypoxic restaurant failure secondary to COVID PNA Questionable concurrent COPD exacerbation - Continue Baricitinib through 06/28/20 - Continue steroids through 06/24/20 - Remdesivir completed 06/19/20 - Wean down O2 as tolerated - stable today - clinically feels better despite hypoxia; wean oxygen - goals 85-90% - Patient continues to remove oxygen at times while eating/sleeping - sats in the 70s on room air at rest. - Dimer trending upwards SpO2: 91 % O2 Flow Rate (L/min): 10 L/min FiO2 (%): 100 % Recent Labs    06/17/20 0418 06/18/20 0409 06/19/20 0539  DDIMER 1.02* 2.16* 8.07*  FERRITIN 1,881* 1,381* 1,127*  CRP 5.3* 2.7* 1.3*   Severe dehydration without creatinine elevation, POA - Completed IVF - increase PO intake as tolerated - Likely 2/2 above  Deconditioning, ambulatory dysfunction, POA Failure to thrive - Continue dietary supplement, trial of Marinol - PT evaluation  CAD status post CABG - Stable  HTN - Continue home meds, hold Lasix for dehydration  DVT prophylaxis: Lovenox  Code Status: Full Code Family  Communication: Wife updated over the phone  Status is: Inpt  Dispo: The patient is from: Home              Anticipated d/c is to: SNF vs HHPT pending ongoing evaluation              Anticipated d/c date is: >72 hours              Patient currently not medically stable for discharge  Consultants:   None  Procedures:   None  Antimicrobials:  Remdesivir  Subjective: No acute issues or events overnight, respiratory status improving slightly, patient clinically feels markedly improved since admission, still complains of dyspnea with exertion but at rest feels close to baseline requesting discharge home.  Denies nausea, vomiting, diarrhea, constipation, headache, fevers, chills. ontinues to endorse poor appetite as well.  Objective: Vitals:   06/19/20 0432 06/19/20 0800 06/19/20 0816 06/19/20 0900  BP: 134/77     Pulse: 66  71   Resp: 18 20 20    Temp: 97.8 F (36.6 C)     TempSrc: Oral     SpO2: 92%  94% 91%  Weight:      Height:        Intake/Output Summary (Last 24 hours) at 06/19/2020 1347 Last data filed at 06/18/2020 1800 Gross per 24 hour  Intake 204.49 ml  Output 1100 ml  Net -895.51 ml   Filed Weights   06/12/2020 1039  Weight: 108.9 kg    Examination:  General exam:  Appears calm and comfortable  Respiratory system: Diminished bilaterally - without overt wheeze/rales. Cardiovascular system: S1 & S2 heard, RRR. No JVD, murmurs, rubs, gallops or clicks. No pedal edema. Gastrointestinal system: Abdomen is nondistended, soft and nontender. No organomegaly or masses felt. Normal bowel sounds heard. Central nervous system: Alert and oriented. No focal neurological deficits. Extremities: Symmetric 5 x 5 power. Skin: No rashes, lesions or ulcers Psychiatry: Judgement and insight appear normal. Mood & affect appropriate.    Data Reviewed: I have personally reviewed following labs and imaging studies  CBC: Recent Labs  Lab 06/15/20 0335 06/16/20 0445  06/17/20 0418 06/18/20 0409 06/19/20 0539  WBC 4.3 4.8 8.6 6.3 6.5  NEUTROABS 3.5 3.4 7.0 4.9 5.2  HGB 15.9 17.4* 16.7 16.3 15.8  HCT 47.6 49.2 47.8 48.0 46.7  MCV 90.3 88.6 89.2 89.4 88.8  PLT 126* 208 233 238 [id number]   Basic Metabolic Panel: Recent Labs  Lab 06/15/20 0335 06/16/20 0445 06/17/20 0418 06/18/20 0409 06/19/20 0539  NA 139 138 142 142 141  K 4.0 4.2 4.0 3.8 3.9  CL 104 102 104 104 104  CO2 23 22 24 27 27   GLUCOSE 144* 189* 227* 227* 222*  BUN 9 21 13 11 15   CREATININE 0.82 1.07 0.79 0.79 0.86  CALCIUM 7.7* 8.2* 8.6* 8.7* 8.5*  MG 1.8 2.3 2.4 2.2 2.2  PHOS 2.5 2.9 2.5 1.8* 2.9   GFR: Estimated Creatinine Clearance: 95.7 mL/min (by C-G formula based on SCr of 0.86 mg/dL). Liver Function Tests: Recent Labs  Lab 06/15/20 0335 06/16/20 0445 06/17/20 0418 06/18/20 0409 06/19/20 0539  AST 69* 69* 57* 38 24  ALT 33 38 38 40 35  ALKPHOS 51 65 76 94 88  BILITOT 1.1 1.6* 0.9 1.6* 1.5*  PROT 6.1* 7.2 6.7 6.5 6.0*  ALBUMIN 2.7* 3.0* 2.8* 2.8* 2.8*   No results for input(s): LIPASE, AMYLASE in the last 168 hours. No results for input(s): AMMONIA in the last 168 hours. Coagulation Profile: No results for input(s): INR, PROTIME in the last 168 hours. Cardiac Enzymes: No results for input(s): CKTOTAL, CKMB, CKMBINDEX, TROPONINI in the last 168 hours. BNP (last 3 results) No results for input(s): PROBNP in the last 8760 hours. HbA1C: No results for input(s): HGBA1C in the last 72 hours. CBG: Recent Labs  Lab 06/18/20 1447 06/18/20 1645 06/18/20 2123 06/19/20 0806 06/19/20 1232  GLUCAP 209* 292* 211* 186* 279*   Lipid Profile: No results for input(s): CHOL, HDL, LDLCALC, TRIG, CHOLHDL, LDLDIRECT in the last 72 hours. Thyroid Function Tests: No results for input(s): TSH, T4TOTAL, FREET4, T3FREE, THYROIDAB in the last 72 hours. Anemia Panel: Recent Labs    06/18/20 0409 06/19/20 0539  FERRITIN 1,381* 1,127*   Sepsis Labs: Recent Labs  Lab  06/24/2020 1740 06/20/2020 1820  PROCALCITON 0.22  --   LATICACIDVEN  --  1.6    No results found for this or any previous visit (from the past 240 hour(s)).   Radiology Studies: No results found. Scheduled Meds: . amLODipine  10 mg Oral Q2000  . aspirin EC  81 mg Oral Q2000  . baricitinib  4 mg Oral Daily  . diclofenac Sodium  2 g Topical Q2000  . dronabinol  2.5 mg Oral BID AC  . enoxaparin (LOVENOX) injection  40 mg Subcutaneous Q24H  . feeding supplement  237 mL Oral BID BM  . insulin aspart  0-15 Units Subcutaneous TID WC  . Ipratropium-Albuterol  1 puff Inhalation TID  . losartan  25 mg Oral Q2000  . methylPREDNISolone (SOLU-MEDROL) injection  40 mg Intravenous Q12H  . metoprolol tartrate  50 mg Oral BID   Continuous Infusions:    LOS: 5 days   Time spent: 25min  Bradley Rufo C Christina Waldrop, DO [facility]  If 7PM-7AM, please contact night-coverage [email or web address]  06/19/2020, 1:47 PM

## 2020-06-19 NOTE — Progress Notes (Signed)
Inpatient Diabetes Program Recommendations  AACE/ADA: New Consensus Statement on Inpatient Glycemic Control (2015)  Target Ranges:  Prepandial:   less than 140 mg/dL      Peak postprandial:   less than 180 mg/dL (1-2 hours)      Critically ill patients:  140 - 180 mg/dL   Results for ALPHONSA, BRICKLE (MRN [id number]) as of 06/19/2020 14:25  Ref. Range 06/18/2020 08:17 06/18/2020 12:02 06/18/2020 14:47 06/18/2020 16:45 06/18/2020 21:23  Glucose-Capillary Latest Ref Range: 70 - 99 mg/dL 232 (H)  5 units NOVOLOG  229 (H)  5 units NOVOLOG  209 (H) 292 (H)  8 units NOVOLOG @6 :17pm 211 (H)   Results for LEXUS, BARLETTA (MRN [id number]) as of 06/19/2020 14:25  Ref. Range 06/19/2020 08:06 06/19/2020 12:32  Glucose-Capillary Latest Ref Range: 70 - 99 mg/dL 186 (H)  3 units NOVOLOG  279 (H)  7 units NOVOLOG     Admit with: Acute hypoxic restaurant failure secondary to COVID PNA  History: DM  Home DM Meds: Metformin 500 mg Daily  Current Orders: Novolog Moderate Correction Scale/ SSI (0-15 units) TID AC      Solumedrol 40 mg BID    MD- Please consider:  1. Start low dose basal insulin: Levemir 5 units QHS (0.05 units/kg)  2. Start Novolog Meal Coverage: Novolog 4 units TID with meals    --Will follow patient during hospitalization--  Wyn Quaker RN, MSN, CDE Diabetes Coordinator Inpatient Glycemic Control Team Team Pager: [phone] (8a-5p)

## 2020-06-19 NOTE — TOC Progression Note (Signed)
Transition of Care [facility]) - Progression Note    Patient Details  Name: Bradley French MRN: [id number] Date of Birth: September 14, 1940  Transition of Care [facility]) CM/SW [place], LCSW Phone Number: 06/19/2020, 10:02 AM  Clinical Narrative:    CSW left another voicemail for patient's VA social worker regarding SNF placement.    Expected Discharge Plan: [place] Barriers to Discharge: Continued Medical Work up,SNF Pending bed offer  Expected Discharge Plan and Services Expected Discharge Plan: [place] Choice: [place] arrangements for the past 2 months: Single Family Home                                       Social Determinants of Health (SDOH) Interventions    Readmission Risk Interventions No flowsheet data found.

## 2020-06-20 DIAGNOSIS — U071 COVID-19: Secondary | ICD-10-CM | POA: Diagnosis not present

## 2020-06-20 LAB — GLUCOSE, CAPILLARY
Glucose-Capillary: 225 mg/dL — ABNORMAL HIGH (ref 70–99)
Glucose-Capillary: 199 mg/dL — ABNORMAL HIGH (ref 70–99)
Glucose-Capillary: 301 mg/dL — ABNORMAL HIGH (ref 70–99)
Glucose-Capillary: 251 mg/dL — ABNORMAL HIGH (ref 70–99)

## 2020-06-20 NOTE — TOC Progression Note (Signed)
Transition of Care [facility]) - Progression Note    Patient Details  Name: SAATVIK THIELMAN MRN: [id number] Date of Birth: December 08, 1940  Transition of Care [facility]) CM/SW Contact  Joanne Chars, LCSW Phone Number: 06/20/2020, 9:01 AM  Clinical Narrative:   CSW left another message with [facility] CSW [place].  Need VA authorization for SNF.    Expected Discharge Plan: [place] Barriers to Discharge: Continued Medical Work up,SNF Pending bed offer  Expected Discharge Plan and Services Expected Discharge Plan: [place] Choice: [place] arrangements for the past 2 months: Single Family Home                                       Social Determinants of Health (SDOH) Interventions    Readmission Risk Interventions No flowsheet data found.

## 2020-06-20 NOTE — Progress Notes (Signed)
PROGRESS NOTE    [name]  [id number] DOB: [birth year] DOA: 06/16/2020 PCP: [facility]   Brief Narrative:  Bradley French is a 80 y.o. male with medical history significant of CAD s/p CABG in 2006, HTN, IIDM, OSA on CPAP, COPD, presented with cough and increasing short of breath. Patient started to have symptoms of dry cough and shortness of breath 6 days ago, and he went to possible COVID-19 5 days ago.  He also had subjective fever and chills, loss of appetite but no abdominal pain or diarrhea.  He has not been eating or drinking much for the last 3 to 4 days.  He was vaccinated for COVID x 2. Went to see his PCP who checked his pulse ox in the office and found low and told him to come to ED. COVID-positive with chest x-ray showed bilateral multifocal infiltrates. 88% on room air, stabilized on 3L 95%.  Assessment & Plan:  Acute hypoxic restaurant failure secondary to COVID PNA Questionable concurrent COPD exacerbation - Continue Baricitinib through 06/28/20 - Continue steroids through 06/24/20 - Remdesivir completed 06/19/20 - Wean down O2 as tolerated - sats low 80s on room air at rest today - Dimer trending upwards SpO2: 98 % O2 Flow Rate (L/min): 9 L/min FiO2 (%): 100 % Recent Labs    06/18/20 0409 06/19/20 0539  DDIMER 2.16* 8.07*  FERRITIN 1,381* 1,127*  CRP 2.7* 1.3*   Severe dehydration without creatinine elevation, POA - Completed IVF - increase PO intake as tolerated - Likely 2/2 above  Deconditioning, ambulatory dysfunction, POA Failure to thrive - Continue dietary supplement, trial of Marinol - PT evaluation  CAD status post CABG - Stable  HTN - Continue home meds, hold Lasix for dehydration  DVT prophylaxis: Lovenox  Code Status: Full Code Family Communication: Wife updated over the phone  Status is: Inpt Dispo: The patient is from: Home              Anticipated d/French is to: SNF vs HHPT pending ongoing evaluation               Anticipated d/French date is: >72 hours              Patient currently not medically stable for discharge  Consultants:   None  Procedures:   None  Antimicrobials:  Remdesivir  Subjective: No acute issues or events overnight, respiratory status improving slightly, patient clinically feels markedly improved since admission, still complains of dyspnea with exertion but at rest feels close to baseline requesting discharge home. Denies nausea, vomiting, diarrhea, constipation, headache, fevers, chills. ontinues to endorse poor appetite as well.  Objective: Vitals:   06/19/20 2035 06/20/20 0525 06/20/20 0906 06/20/20 0923  BP: 123/67 119/60 (!) 170/95 129/63  Pulse: 60 73 87 72  Resp: 19 18 20 18   Temp: 97.6 F (36.4 French) 97.8 F (36.6 French) 98.6 F (37 French) 97.8 F (36.6 French)  TempSrc: Oral Oral Oral Oral  SpO2: 90% 91% 100% 98%  Weight:      Height:       No intake or output data in the 24 hours ending 06/20/20 1406 Filed Weights   06/08/2020 1039  Weight: 108.9 kg    Examination:  General exam: Appears calm and comfortable  Respiratory system: Diminished bilaterally - without overt wheeze/rales. Cardiovascular system: S1 & S2 heard, RRR. No JVD, murmurs, rubs, gallops or clicks. No pedal edema. Gastrointestinal system: Abdomen is nondistended, soft and nontender. No organomegaly or masses  felt. Normal bowel sounds heard. Central nervous system: Alert and oriented. No focal neurological deficits. Extremities: Symmetric 5 x 5 power. Skin: No rashes, lesions or ulcers Psychiatry: Judgement and insight appear normal. Somewhat depressed mood & affect appropriate.  Data Reviewed: I have personally reviewed following labs and imaging studies  CBC: Recent Labs  Lab 06/15/20 0335 06/16/20 0445 06/17/20 0418 06/18/20 0409 06/19/20 0539  WBC 4.3 4.8 8.6 6.3 6.5  NEUTROABS 3.5 3.4 7.0 4.9 5.2  HGB 15.9 17.4* 16.7 16.3 15.8  HCT 47.6 49.2 47.8 48.0 46.7  MCV 90.3 88.6 89.2 89.4 88.8   PLT 126* 208 233 238 735   Basic Metabolic Panel: Recent Labs  Lab 06/15/20 0335 06/16/20 0445 06/17/20 0418 06/18/20 0409 06/19/20 0539  NA 139 138 142 142 141  K 4.0 4.2 4.0 3.8 3.9  CL 104 102 104 104 104  CO2 23 22 24 27 27   GLUCOSE 144* 189* 227* 227* 222*  BUN 9 21 13 11 15   CREATININE 0.82 1.07 0.79 0.79 0.86  CALCIUM 7.7* 8.2* 8.6* 8.7* 8.5*  MG 1.8 2.3 2.4 2.2 2.2  PHOS 2.5 2.9 2.5 1.8* 2.9   GFR: Estimated Creatinine Clearance: 95.7 mL/min (by French-G formula based on SCr of 0.86 mg/dL). Liver Function Tests: Recent Labs  Lab 06/15/20 0335 06/16/20 0445 06/17/20 0418 06/18/20 0409 06/19/20 0539  AST 69* 69* 57* 38 24  ALT 33 38 38 40 35  ALKPHOS 51 65 76 94 88  BILITOT 1.1 1.6* 0.9 1.6* 1.5*  PROT 6.1* 7.2 6.7 6.5 6.0*  ALBUMIN 2.7* 3.0* 2.8* 2.8* 2.8*   No results for input(s): LIPASE, AMYLASE in the last 168 hours. No results for input(s): AMMONIA in the last 168 hours. Coagulation Profile: No results for input(s): INR, PROTIME in the last 168 hours. Cardiac Enzymes: No results for input(s): CKTOTAL, CKMB, CKMBINDEX, TROPONINI in the last 168 hours. BNP (last 3 results) No results for input(s): PROBNP in the last 8760 hours. HbA1C: No results for input(s): HGBA1C in the last 72 hours. CBG: Recent Labs  Lab 06/19/20 1232 06/19/20 1827 06/19/20 2032 06/20/20 0841 06/20/20 1139  GLUCAP 279* 233* 235* 225* 199*   Lipid Profile: No results for input(s): CHOL, HDL, LDLCALC, TRIG, CHOLHDL, LDLDIRECT in the last 72 hours. Thyroid Function Tests: No results for input(s): TSH, T4TOTAL, FREET4, T3FREE, THYROIDAB in the last 72 hours. Anemia Panel: Recent Labs    06/18/20 0409 06/19/20 0539  FERRITIN 1,381* 1,127*   Sepsis Labs: Recent Labs  Lab 06/28/2020 1740 06/20/2020 1820  PROCALCITON 0.22  --   LATICACIDVEN  --  1.6    No results found for this or any previous visit (from the past 240 hour(s)).   Radiology Studies: No results  found. Scheduled Meds: . amLODipine  10 mg Oral Q2000  . aspirin EC  81 mg Oral Q2000  . baricitinib  4 mg Oral Daily  . diclofenac Sodium  2 g Topical Q2000  . dronabinol  2.5 mg Oral BID AC  . enoxaparin (LOVENOX) injection  40 mg Subcutaneous Q24H  . feeding supplement  237 mL Oral BID BM  . insulin aspart  0-15 Units Subcutaneous TID WC  . Ipratropium-Albuterol  1 puff Inhalation TID  . losartan  25 mg Oral Q2000  . methylPREDNISolone (SOLU-MEDROL) injection  40 mg Intravenous Q12H  . metoprolol tartrate  50 mg Oral BID    LOS: 6 days   Time spent: 31min  Bradley French Janaria Mccammon, DO [facility]  If 7PM-7AM, please contact night-coverage [email or web address]  06/20/2020, 2:06 PM

## 2020-06-21 DIAGNOSIS — U071 COVID-19: Secondary | ICD-10-CM | POA: Diagnosis not present

## 2020-06-21 LAB — CBC
HCT: 44.8 % (ref 39.0–52.0)
Hemoglobin: 15.8 g/dL (ref 13.0–17.0)
MCH: 30.9 pg (ref 26.0–34.0)
MCHC: 35.3 g/dL (ref 30.0–36.0)
MCV: 87.5 fL (ref 80.0–100.0)
Platelets: 372 10*3/uL (ref 150–400)
RBC: 5.12 MIL/uL (ref 4.22–5.81)
RDW: 13.1 % (ref 11.5–15.5)
WBC: 8.1 10*3/uL (ref 4.0–10.5)
nRBC: 0 % (ref 0.0–0.2)

## 2020-06-21 LAB — BASIC METABOLIC PANEL
Anion gap: 8 (ref 5–15)
BUN: 17 mg/dL (ref 8–23)
CO2: 27 mmol/L (ref 22–32)
Calcium: 8.6 mg/dL — ABNORMAL LOW (ref 8.9–10.3)
Chloride: 104 mmol/L (ref 98–111)
Creatinine, Ser: 0.85 mg/dL (ref 0.61–1.24)
GFR, Estimated: >60 mL/min (ref >60)
Glucose, Bld: 244 mg/dL — ABNORMAL HIGH (ref 70–99)
Potassium: 4.5 mmol/L (ref 3.5–5.1)
Sodium: 139 mmol/L (ref 135–145)

## 2020-06-21 LAB — GLUCOSE, CAPILLARY
Glucose-Capillary: 241 mg/dL — ABNORMAL HIGH (ref 70–99)
Glucose-Capillary: 247 mg/dL — ABNORMAL HIGH (ref 70–99)
Glucose-Capillary: 233 mg/dL — ABNORMAL HIGH (ref 70–99)
Glucose-Capillary: 179 mg/dL — ABNORMAL HIGH (ref 70–99)

## 2020-06-21 LAB — C-REACTIVE PROTEIN: CRP: 1 mg/dL — ABNORMAL HIGH (ref <1.0)

## 2020-06-21 LAB — D-DIMER, QUANTITATIVE: D-Dimer, Quant: 10.67 ug/mL-FEU — ABNORMAL HIGH (ref 0.00–0.50)

## 2020-06-21 MED ORDER — ENOXAPARIN SODIUM 40 MG/0.4ML ~~LOC~~ SOLN
40.0000 mg | Freq: Two times a day (BID) | SUBCUTANEOUS | Status: DC
Start: 2020-06-21 — End: 2020-07-10
  Administered 2020-06-21 – 2020-07-10 (×39): 40 mg via SUBCUTANEOUS
  Filled 2020-06-21 (×39): qty 0.4

## 2020-06-21 MED ORDER — NICOTINE 21 MG/24HR TD PT24
21.0000 mg | MEDICATED_PATCH | Freq: Every day | TRANSDERMAL | Status: DC
  Administered 2020-06-21 – 2020-07-11 (×20): 21 mg via TRANSDERMAL
  Filled 2020-06-21 (×20): qty 1

## 2020-06-21 NOTE — TOC Progression Note (Addendum)
Transition of Care [facility]) - Progression Note    Patient Details  Name: LEANTHONY RHETT MRN: [id number] Date of Birth: [birth year]  Transition of Care [facility]) CM/SW Contact  Joanne Chars, LCSW Phone Number: 06/21/2020, 9:29 AM  Clinical Narrative:   [facility] SN[facility] request sent to [facility].  Per Marquita, they will review today at 11am and we should have an answer this afternoon.  1600: PHone call from [place], [place].  Pt has been approved for 30 day contract at [facility] facility.     Expected Discharge Plan: [place] Barriers to Discharge: Continued Medical Work up,SNF Pending bed offer  Expected Discharge Plan and Services Expected Discharge Plan: [place] Choice: [place] arrangements for the past 2 months: Single Family Home                                       Social Determinants of Health (SDOH) Interventions    Readmission Risk Interventions No flowsheet data found.

## 2020-06-21 NOTE — Progress Notes (Signed)
Physical Therapy Treatment Patient Details Name: Bradley French MRN: [id number] DOB: 03-14-1941 Today's Date: 06/21/2020    History of Present Illness 80 yo male with onset of SOB and cough.  Has been home with his wife, had poor intake and feeling bad.  PMHx:  CABG, HTN, DM, OSA, CPAP, COPD, CAD, anxiety, cataracts, colon polyps, depression, ulcer,    PT Comments    Pt with improved tolerance for standing during session. Pt continues to be limited due to drop in O2 with movement. Pt demonstrating improved standing balance with 1 LOB noted with no AD. Pt continues to benefit from skilled PT to address deficits in balance, gait and safety tolerance to maximize independence with functional moblity prior to discharge.     Follow Up Recommendations  SNF;Home health PT;Supervision/Assistance - 24 hour     Equipment Recommendations  Other (comment) (could need rollator pending tolerance for ambulation and need for seated rest break)    Recommendations for Other Services       Precautions / Restrictions Precautions Precautions: Fall Precaution Comments: monitor HR and sats    Mobility  Bed Mobility                  Transfers Overall transfer level: Needs assistance Equipment used: None Transfers: Sit to/from Stand Sit to Stand: Supervision;Min guard        Lateral/Scoot Transfers: Min guard;Supervision General transfer comment: pt perfomed sit to stand with S-min guard during session. Pt performed lateral movement up and down bed with S with 1 LOB requiring min guard and use of bed to regain balance  Ambulation/Gait                 Stairs             Wheelchair Mobility    Modified Rankin (Stroke Patients Only)       Balance Overall balance assessment: Needs assistance Sitting-balance support: Feet supported Sitting balance-Leahy Scale: Fair     Standing balance support: No upper extremity supported Standing balance-Leahy Scale:  Poor Standing balance comment: poor due to 1 LOB during standing activity               High Level Balance Comments: performed marching in place with pt demonstrating 1 LOB requiring min guard assist and use of bed to regain balance            Cognition Arousal/Alertness: Awake/alert Behavior During Therapy: WFL for tasks assessed/performed Overall Cognitive Status: Within Functional Limits for tasks assessed                                        Exercises General Exercises - Lower Extremity Long Arc Quad: AROM;Strengthening;20 reps;Seated;Both Hip Flexion/Marching: AROM;Strengthening;Both;20 reps;Seated    General Comments General comments (skin integrity, edema, etc.): pt on 7L HFNC upon arrival with O2 between 88-94%. during activities pt decreased to 75%. pt returned to sitting with verbal cueing for breathing and unable to increase >81% on 7 L. increased to 8 L and pt able to increase to 88% and returned to 7L while seated      Pertinent Vitals/Pain Pain Assessment: No/denies pain    Home Living                      Prior Function            PT Goals (current  goals can now be found in the care plan section) Acute Rehab PT Goals Patient Stated Goal: to get home and feel better PT Goal Formulation: With patient Time For Goal Achievement: 06/29/20 Potential to Achieve Goals: Good Progress towards PT goals: Progressing toward goals    Frequency    Min 3X/week      PT Plan Current plan remains appropriate    Co-evaluation              AM-PAC PT "6 Clicks" Mobility   Outcome Measure  Help needed turning from your back to your side while in a flat bed without using bedrails?: None Help needed moving from lying on your back to sitting on the side of a flat bed without using bedrails?: None Help needed moving to and from a bed to a chair (including a wheelchair)?: A Little Help needed standing up from a chair using your  arms (e.g., wheelchair or bedside chair)?: A Little Help needed to walk in hospital room?: A Little Help needed climbing 3-5 steps with a railing? : A Lot 6 Click Score: 19    End of Session Equipment Utilized During Treatment: Oxygen;Gait belt Activity Tolerance: Patient tolerated treatment well (continues to be limited due to drop in O2 during activity) Patient left: Other (comment);with bed alarm set (sitting EOB) Nurse Communication: Mobility status PT Visit Diagnosis: Unsteadiness on feet (R26.81);Muscle weakness (generalized) (M62.81)     Time: 5638-7564 PT Time Calculation (min) (ACUTE ONLY): 17 min  Charges:  $Therapeutic Exercise: 8-22 mins                     Lyanne Co, DPT Acute Rehabilitation Services [phone]   [place] 06/21/2020, 11:29 AM

## 2020-06-21 NOTE — Progress Notes (Signed)
PROGRESS NOTE    Bradley French  [id number] DOB: 24-Aug-1940 DOA: 06/16/2020 PCP: [facility]   Brief Narrative:  Bradley French is a 80 y.o. male with medical history significant of CAD s/p CABG in 2006, HTN, IIDM, OSA on CPAP, COPD, presented with cough and increasing short of breath. Patient started to have symptoms of dry cough and shortness of breath 6 days ago, and he went to possible COVID-19 5 days ago.  He also had subjective fever and chills, loss of appetite but no abdominal pain or diarrhea.  He has not been eating or drinking much for the last 3 to 4 days.  He was vaccinated for COVID x 2. Went to see his PCP who checked his pulse ox in the office and found low and told him to come to ED. COVID-positive with chest x-ray showed bilateral multifocal infiltrates. 88% on room air, stabilized on 3L 95%.  Assessment & Plan:  Acute hypoxic restaurant failure secondary to COVID PNA Questionable concurrent COPD exacerbation - Continue Baricitinib through 06/28/20 - Continue steroids through 06/24/20 - Remdesivir completed 06/19/20 - Wean down O2 as tolerated - sats low 80s on room air at rest today -Continue to impress upon the patient the importance of early ambulation and proning, patient complains of fatigue and currently not attempting to or wanting to interact with physical therapy or staff which would likely prolong his hospitalization - Dimer trending upwards -start Lovenox 40 twice daily SpO2: 94 % O2 Flow Rate (L/min): 7 L/min FiO2 (%): 100 % Recent Labs    06/19/20 0539 06/21/20 0319  DDIMER 8.07* 10.67*  FERRITIN 1,127*  --   CRP 1.3* 1.0*   Severe dehydration without creatinine elevation, POA - Completed IVF - increase PO intake as tolerated - Likely 2/2 above  Deconditioning, ambulatory dysfunction, POA Failure to thrive - Continue dietary supplement, trial of Marinol - PT evaluation  CAD status post CABG - Stable  HTN - Continue home meds,  hold Lasix for dehydration  DVT prophylaxis: Lovenox  Code Status: Full Code Family Communication: Wife updated over the phone at length  Status is: Inpt Dispo: The patient is from: Home              Anticipated d/c is to: SNF vs HHPT pending ongoing evaluation              Anticipated d/c date is: >72 hours              Patient currently not medically stable for discharge  Consultants:   None  Procedures:   None  Antimicrobials:  Remdesivir  Subjective: No acute issues or events overnight, respiratory status needs to improve drastically, patient states he feels "good enough to go home" but we discussed his ongoing hypoxia.  He appears to be markedly fatigued, denies any recent ambulation, denies chest pain, shortness of breath, nausea, vomiting, diarrhea, constipation, headache, fevers, chills.  Lengthy discussion at bedside this morning about need for improved activity, proning and interaction with PT..  Objective: Vitals:   06/20/20 2132 06/20/20 2211 06/21/20 0544 06/21/20 1124  BP: (!) 117/54  (!) 97/51   Pulse: 64  70   Resp: 20  20   Temp: 98.2 F (36.8 C)  97.8 F (36.6 C)   TempSrc: Oral  Oral   SpO2: 94% (!) 88% 93% 94%  Weight:      Height:        Intake/Output Summary (Last 24 hours) at 06/21/2020 1200  Last data filed at 06/21/2020 0538 Gross per 24 hour  Intake --  Output 1700 ml  Net -1700 ml   Filed Weights   06-28-20 1039  Weight: 108.9 kg    Examination:  General:  Pleasantly resting in bed, No acute distress. HEENT:  Normocephalic atraumatic.  Sclerae nonicteric, noninjected.  Extraocular movements intact bilaterally. Neck:  Without mass or deformity.  Trachea is midline. Lungs:  Clear to auscultate bilaterally without rhonchi, wheeze, or rales. Heart:  Regular rate and rhythm.  Without murmurs, rubs, or gallops. Abdomen:  Soft, nontender, nondistended.  Without guarding or rebound. Extremities: Without cyanosis, clubbing, edema, or  obvious deformity. Vascular:  Dorsalis pedis and posterior tibial pulses palpable bilaterally. Skin:  Warm and dry, no erythema, no ulcerations.   Data Reviewed: I have personally reviewed following labs and imaging studies  CBC: Recent Labs  Lab 06/15/20 0335 06/16/20 0445 06/17/20 0418 06/18/20 0409 06/19/20 0539 06/21/20 0319  WBC 4.3 4.8 8.6 6.3 6.5 8.1  NEUTROABS 3.5 3.4 7.0 4.9 5.2  --   HGB 15.9 17.4* 16.7 16.3 15.8 15.8  HCT 47.6 49.2 47.8 48.0 46.7 44.8  MCV 90.3 88.6 89.2 89.4 88.8 87.5  PLT 126* 208 233 238 276 324   Basic Metabolic Panel: Recent Labs  Lab 06/15/20 0335 06/16/20 0445 06/17/20 0418 06/18/20 0409 06/19/20 0539 06/21/20 0319  NA 139 138 142 142 141 139  K 4.0 4.2 4.0 3.8 3.9 4.5  CL 104 102 104 104 104 104  CO2 23 22 24 27 27 27   GLUCOSE 144* 189* 227* 227* 222* 244*  BUN 9 21 13 11 15 17   CREATININE 0.82 1.07 0.79 0.79 0.86 0.85  CALCIUM 7.7* 8.2* 8.6* 8.7* 8.5* 8.6*  MG 1.8 2.3 2.4 2.2 2.2  --   PHOS 2.5 2.9 2.5 1.8* 2.9  --    GFR: Estimated Creatinine Clearance: 96.8 mL/min (by C-G formula based on SCr of 0.85 mg/dL). Liver Function Tests: Recent Labs  Lab 06/15/20 0335 06/16/20 0445 06/17/20 0418 06/18/20 0409 06/19/20 0539  AST 69* 69* 57* 38 24  ALT 33 38 38 40 35  ALKPHOS 51 65 76 94 88  BILITOT 1.1 1.6* 0.9 1.6* 1.5*  PROT 6.1* 7.2 6.7 6.5 6.0*  ALBUMIN 2.7* 3.0* 2.8* 2.8* 2.8*   No results for input(s): LIPASE, AMYLASE in the last 168 hours. No results for input(s): AMMONIA in the last 168 hours. Coagulation Profile: No results for input(s): INR, PROTIME in the last 168 hours. Cardiac Enzymes: No results for input(s): CKTOTAL, CKMB, CKMBINDEX, TROPONINI in the last 168 hours. BNP (last 3 results) No results for input(s): PROBNP in the last 8760 hours. HbA1C: No results for input(s): HGBA1C in the last 72 hours. CBG: Recent Labs  Lab 06/20/20 1139 06/20/20 1548 06/20/20 2129 06/21/20 0718 06/21/20 1155   GLUCAP 199* 301* 251* 241* 247*   Lipid Profile: No results for input(s): CHOL, HDL, LDLCALC, TRIG, CHOLHDL, LDLDIRECT in the last 72 hours. Thyroid Function Tests: No results for input(s): TSH, T4TOTAL, FREET4, T3FREE, THYROIDAB in the last 72 hours. Anemia Panel: Recent Labs    06/19/20 0539  FERRITIN 1,127*   Sepsis Labs: Recent Labs  Lab 2020-06-28 1740 06-28-2020 1820  PROCALCITON 0.22  --   LATICACIDVEN  --  1.6    No results found for this or any previous visit (from the past 240 hour(s)).   Radiology Studies: No results found. Scheduled Meds: . amLODipine  10 mg Oral Q2000  . aspirin  EC  81 mg Oral Q2000  . baricitinib  4 mg Oral Daily  . diclofenac Sodium  2 g Topical Q2000  . dronabinol  2.5 mg Oral BID AC  . enoxaparin (LOVENOX) injection  40 mg Subcutaneous Q12H  . feeding supplement  237 mL Oral BID BM  . insulin aspart  0-15 Units Subcutaneous TID WC  . Ipratropium-Albuterol  1 puff Inhalation TID  . losartan  25 mg Oral Q2000  . methylPREDNISolone (SOLU-MEDROL) injection  40 mg Intravenous Q12H  . metoprolol tartrate  50 mg Oral BID    LOS: 7 days   Time spent: 59min  Makylah Bossard C Yulitza Shorts, DO [facility]  If 7PM-7AM, please contact night-coverage [email or web address]  06/21/2020, 12:00 PM

## 2020-06-21 NOTE — Progress Notes (Signed)
Inpatient Diabetes Program Recommendations  AACE/ADA: New Consensus Statement on Inpatient Glycemic Control (2015)  Target Ranges:  Prepandial:   less than 140 mg/dL      Peak postprandial:   less than 180 mg/dL (1-2 hours)      Critically ill patients:  140 - 180 mg/dL   Lab Results  Component Value Date   GLUCAP 241 (H) 06/21/2020   HGBA1C 5.9 (H) 04/12/2020    Review of Glycemic Control Results for Bradley French, Bradley French (MRN [id number]) as of 06/21/2020 09:36  Ref. Range 06/20/2020 15:48 06/20/2020 21:29 06/21/2020 07:18  Glucose-Capillary Latest Ref Range: 70 - 99 mg/dL 301 (H) 251 (H) 241 (H)   Home DM Meds: Metformin 500 mg Daily  Current Orders: Novolog Moderate Correction Scale/ SSI (0-15 units) TID AC   Solumedrol 40 mg BID  If patient to remain inpatient consider:  1. Start low dose basal insulin: Levemir 8 units QHS (0.1 units/kg)  2. Start Novolog Meal Coverage: Novolog 3 units TID with meals  Thanks, Bronson Curb, MSN, RNC-OB Diabetes Coordinator [phone] (8a-5p)

## 2020-06-22 DIAGNOSIS — U071 COVID-19: Secondary | ICD-10-CM | POA: Diagnosis not present

## 2020-06-22 LAB — BASIC METABOLIC PANEL
Anion gap: 11 (ref 5–15)
BUN: 18 mg/dL (ref 8–23)
CO2: 25 mmol/L (ref 22–32)
Calcium: 8.7 mg/dL — ABNORMAL LOW (ref 8.9–10.3)
Chloride: 102 mmol/L (ref 98–111)
Creatinine, Ser: 0.84 mg/dL (ref 0.61–1.24)
GFR, Estimated: >60 mL/min (ref >60)
Glucose, Bld: 218 mg/dL — ABNORMAL HIGH (ref 70–99)
Potassium: 4.6 mmol/L (ref 3.5–5.1)
Sodium: 138 mmol/L (ref 135–145)

## 2020-06-22 LAB — CBC
HCT: 47.2 % (ref 39.0–52.0)
Hemoglobin: 16.1 g/dL (ref 13.0–17.0)
MCH: 30 pg (ref 26.0–34.0)
MCHC: 34.1 g/dL (ref 30.0–36.0)
MCV: 88.1 fL (ref 80.0–100.0)
Platelets: 390 10*3/uL (ref 150–400)
RBC: 5.36 MIL/uL (ref 4.22–5.81)
RDW: 13.1 % (ref 11.5–15.5)
WBC: 11.4 10*3/uL — ABNORMAL HIGH (ref 4.0–10.5)
nRBC: 0 % (ref 0.0–0.2)

## 2020-06-22 LAB — GLUCOSE, CAPILLARY
Glucose-Capillary: 279 mg/dL — ABNORMAL HIGH (ref 70–99)
Glucose-Capillary: 347 mg/dL — ABNORMAL HIGH (ref 70–99)
Glucose-Capillary: 247 mg/dL — ABNORMAL HIGH (ref 70–99)
Glucose-Capillary: 256 mg/dL — ABNORMAL HIGH (ref 70–99)

## 2020-06-22 LAB — D-DIMER, QUANTITATIVE: D-Dimer, Quant: 10.11 ug/mL-FEU — ABNORMAL HIGH (ref 0.00–0.50)

## 2020-06-22 LAB — C-REACTIVE PROTEIN: CRP: 1.6 mg/dL — ABNORMAL HIGH (ref <1.0)

## 2020-06-22 MED ORDER — POLYETHYLENE GLYCOL 3350 17 G PO PACK
17.0000 g | PACK | Freq: Two times a day (BID) | ORAL | Status: DC
  Administered 2020-06-22 – 2020-07-09 (×20): 17 g via ORAL
  Filled 2020-06-22 (×26): qty 1

## 2020-06-22 NOTE — Progress Notes (Signed)
PROGRESS NOTE    Bradley French  JQB:[id number] DOB: August 24, 1940 DOA: 06-29-2020 PCP: [facility]   Brief Narrative:  Bradley French is a 80 y.o. male with medical history significant of CAD s/p CABG in 2006, HTN, IIDM, OSA on CPAP, COPD, presented with cough and increasing short of breath. Patient started to have symptoms of dry cough and shortness of breath 6 days ago, and he went to possible COVID-19 5 days ago.  He also had subjective fever and chills, loss of appetite but no abdominal pain or diarrhea.  He has not been eating or drinking much for the last 3 to 4 days.  He was vaccinated for COVID x 2. Went to see his PCP who checked his pulse ox in the office and found low and told him to come to ED. COVID-positive with chest x-ray showed bilateral multifocal infiltrates. 88% on room air, stabilized on 3L 95%.  Assessment & Plan:  Acute hypoxic restaurant failure secondary to COVID PNA Questionable concurrent COPD exacerbation - Continue Baricitinib through 06/28/20 - Continue steroids through 06/24/20 - Remdesivir completed 06/19/20 - Wean down O2 as tolerated - sats 85-86% on room air at rest this morning - Continue to impress upon the patient the importance of early ambulation and proning, patient complains of fatigue and currently not attempting to or wanting to interact with physical therapy or staff which would likely prolong his hospitalization - Dimer trending upwards -start Lovenox 40 twice daily SpO2: 92 % O2 Flow Rate (L/min): 4 L/min FiO2 (%): 100 % Recent Labs    06/21/20 0319 06/22/20 0211  DDIMER 10.67* 10.11*  CRP 1.0* 1.6*   Severe dehydration without creatinine elevation, POA - Completed IVF - increase PO intake as tolerated - Likely 2/2 above  Deconditioning, ambulatory dysfunction, POA Failure to thrive - Continue dietary supplement, trial of Marinol - PT evaluation  CAD status post CABG - Stable  HTN - Continue home meds, hold Lasix for  dehydration  DVT prophylaxis: Lovenox  Code Status: Full Code Family Communication: Wife updated over the phone at length  Status is: Inpt Dispo: The patient is from: Home              Anticipated d/c is to: SNF vs HHPT pending ongoing evaluation              Anticipated d/c date is: 24-48 hours              Patient currently not medically stable for discharge  Consultants:   None  Procedures:   None  Antimicrobials:  Remdesivir  Subjective: No acute issues or events overnight, respiratory status continues to improve drastically, patient continues to request discharge home.  Denies nausea vomiting diarrhea constipation headache fevers or chills. Shortness of breath drastically improving but not yet resolved.  Objective: Vitals:   06/21/20 1124 06/21/20 1505 06/21/20 2100 06/22/20 1039  BP:  128/64 125/87 110/67  Pulse:  70 78 89  Resp:  18 20   Temp:  98.8 F (37.1 C) 98 F (36.7 C)   TempSrc:  Oral Oral   SpO2: 94% 95% 95% 92%  Weight:      Height:       No intake or output data in the 24 hours ending 06/22/20 1403 Filed Weights   06/29/20 1039  Weight: 108.9 kg    Examination:  General:  Pleasantly sitting up in bed, No acute distress. HEENT:  Normocephalic atraumatic.  Sclerae nonicteric, noninjected.  Extraocular movements  intact bilaterally. Neck:  Without mass or deformity.  Trachea is midline. Lungs:  Clear to auscultate bilaterally without rhonchi, wheeze, or rales. Heart:  Regular rate and rhythm.  Without murmurs, rubs, or gallops. Abdomen:  Soft, nontender, nondistended.  Without guarding or rebound. Extremities: Without cyanosis, clubbing, edema, or obvious deformity. Vascular:  Dorsalis pedis and posterior tibial pulses palpable bilaterally. Skin:  Warm and dry, no erythema, no ulcerations.   Data Reviewed: I have personally reviewed following labs and imaging studies  CBC: Recent Labs  Lab 06/16/20 0445 06/17/20 0418 06/18/20 0409  06/19/20 0539 06/21/20 0319 06/22/20 0211  WBC 4.8 8.6 6.3 6.5 8.1 11.4*  NEUTROABS 3.4 7.0 4.9 5.2  --   --   HGB 17.4* 16.7 16.3 15.8 15.8 16.1  HCT 49.2 47.8 48.0 46.7 44.8 47.2  MCV 88.6 89.2 89.4 88.8 87.5 88.1  PLT 208 233 238 276 372 [id number]   Basic Metabolic Panel: Recent Labs  Lab 06/16/20 0445 06/17/20 0418 06/18/20 0409 06/19/20 0539 06/21/20 0319 06/22/20 0211  NA 138 142 142 141 139 138  K 4.2 4.0 3.8 3.9 4.5 4.6  CL 102 104 104 104 104 102  CO2 22 24 27 27 27 25   GLUCOSE 189* 227* 227* 222* 244* 218*  BUN 21 13 11 15 17 18   CREATININE 1.07 0.79 0.79 0.86 0.85 0.84  CALCIUM 8.2* 8.6* 8.7* 8.5* 8.6* 8.7*  MG 2.3 2.4 2.2 2.2  --   --   PHOS 2.9 2.5 1.8* 2.9  --   --    GFR: Estimated Creatinine Clearance: 98 mL/min (by C-G formula based on SCr of 0.84 mg/dL). Liver Function Tests: Recent Labs  Lab 06/16/20 0445 06/17/20 0418 06/18/20 0409 06/19/20 0539  AST 69* 57* 38 24  ALT 38 38 40 35  ALKPHOS 65 76 94 88  BILITOT 1.6* 0.9 1.6* 1.5*  PROT 7.2 6.7 6.5 6.0*  ALBUMIN 3.0* 2.8* 2.8* 2.8*   No results for input(s): LIPASE, AMYLASE in the last 168 hours. No results for input(s): AMMONIA in the last 168 hours. Coagulation Profile: No results for input(s): INR, PROTIME in the last 168 hours. Cardiac Enzymes: No results for input(s): CKTOTAL, CKMB, CKMBINDEX, TROPONINI in the last 168 hours. BNP (last 3 results) No results for input(s): PROBNP in the last 8760 hours. HbA1C: No results for input(s): HGBA1C in the last 72 hours. CBG: Recent Labs  Lab 06/21/20 1155 06/21/20 1644 06/21/20 2125 06/22/20 1037 06/22/20 1358  GLUCAP 247* 233* 179* 279* 347*   Lipid Profile: No results for input(s): CHOL, HDL, LDLCALC, TRIG, CHOLHDL, LDLDIRECT in the last 72 hours. Thyroid Function Tests: No results for input(s): TSH, T4TOTAL, FREET4, T3FREE, THYROIDAB in the last 72 hours. Anemia Panel: No results for input(s): VITAMINB12, FOLATE, FERRITIN, TIBC, IRON,  RETICCTPCT in the last 72 hours. Sepsis Labs: No results for input(s): PROCALCITON, LATICACIDVEN in the last 168 hours.  No results found for this or any previous visit (from the past 240 hour(s)).   Radiology Studies: No results found. Scheduled Meds: . amLODipine  10 mg Oral Q2000  . aspirin EC  81 mg Oral Q2000  . baricitinib  4 mg Oral Daily  . diclofenac Sodium  2 g Topical Q2000  . dronabinol  2.5 mg Oral BID AC  . enoxaparin (LOVENOX) injection  40 mg Subcutaneous Q12H  . feeding supplement  237 mL Oral BID BM  . insulin aspart  0-15 Units Subcutaneous TID WC  . Ipratropium-Albuterol  1 puff  Inhalation TID  . losartan  25 mg Oral Q2000  . methylPREDNISolone (SOLU-MEDROL) injection  40 mg Intravenous Q12H  . metoprolol tartrate  50 mg Oral BID  . nicotine  21 mg Transdermal Daily    LOS: 8 days   Time spent: 70min  Carita Sollars C Tessica Cupo, DO [facility]  If 7PM-7AM, please contact night-coverage [email or web address]  06/22/2020, 2:03 PM

## 2020-06-23 DIAGNOSIS — U071 COVID-19: Secondary | ICD-10-CM | POA: Diagnosis not present

## 2020-06-23 LAB — CBC
HCT: 46.3 % (ref 39.0–52.0)
Hemoglobin: 15.8 g/dL (ref 13.0–17.0)
MCH: 30.3 pg (ref 26.0–34.0)
MCHC: 34.1 g/dL (ref 30.0–36.0)
MCV: 88.9 fL (ref 80.0–100.0)
Platelets: 368 10*3/uL (ref 150–400)
RBC: 5.21 MIL/uL (ref 4.22–5.81)
RDW: 13.1 % (ref 11.5–15.5)
WBC: 9.1 10*3/uL (ref 4.0–10.5)
nRBC: 0 % (ref 0.0–0.2)

## 2020-06-23 LAB — C-REACTIVE PROTEIN: CRP: 3.3 mg/dL — ABNORMAL HIGH (ref <1.0)

## 2020-06-23 LAB — BASIC METABOLIC PANEL
Anion gap: 9 (ref 5–15)
BUN: 22 mg/dL (ref 8–23)
CO2: 26 mmol/L (ref 22–32)
Calcium: 9.1 mg/dL (ref 8.9–10.3)
Chloride: 102 mmol/L (ref 98–111)
Creatinine, Ser: 0.88 mg/dL (ref 0.61–1.24)
GFR, Estimated: >60 mL/min (ref >60)
Glucose, Bld: 365 mg/dL — ABNORMAL HIGH (ref 70–99)
Potassium: 5.1 mmol/L (ref 3.5–5.1)
Sodium: 137 mmol/L (ref 135–145)

## 2020-06-23 LAB — GLUCOSE, CAPILLARY
Glucose-Capillary: 274 mg/dL — ABNORMAL HIGH (ref 70–99)
Glucose-Capillary: 357 mg/dL — ABNORMAL HIGH (ref 70–99)
Glucose-Capillary: 339 mg/dL — ABNORMAL HIGH (ref 70–99)
Glucose-Capillary: 245 mg/dL — ABNORMAL HIGH (ref 70–99)

## 2020-06-23 LAB — D-DIMER, QUANTITATIVE: D-Dimer, Quant: 3.85 ug/mL-FEU — ABNORMAL HIGH (ref 0.00–0.50)

## 2020-06-23 MED ORDER — INSULIN GLARGINE 100 UNIT/ML ~~LOC~~ SOLN
5.0000 [IU] | Freq: Every day | SUBCUTANEOUS | Status: DC
  Administered 2020-06-23: 5 [IU] via SUBCUTANEOUS
  Filled 2020-06-23 (×2): qty 0.05

## 2020-06-23 NOTE — Progress Notes (Signed)
Physical Therapy Treatment Patient Details Name: Bradley French MRN: [id number] DOB: [birth year] Today's Date: 06/23/2020    History of Present Illness 80 yo male with onset of SOB and cough.  Has been home with his wife, had poor intake and feeling bad.  PMHx:  CABG, HTN, DM, OSA, CPAP, COPD, CAD, anxiety, cataracts, colon polyps, depression, ulcer,    PT Comments    Pt making progress tolerating ambulating within the room with and without SPC with min guard assist. However, his SpO2 levels continue to drop with mobility, limiting his progress (see General Comments below). Focused session on progressing gait and improving balance through reaching off BOS in sitting and standing. Increase in trunk sway and LOB when reaching off BOS possibly due to drop in sats as his eyes began to look blank. Required min guard to recover balance. Will continue to follow acutely. Current recommendations remain appropriate.   Follow Up Recommendations  SNF;Home health PT;Supervision/Assistance - 24 hour     Equipment Recommendations  Other (comment) (could need rollator pending tolerance for ambulation and need for seated rest break)    Recommendations for Other Services       Precautions / Restrictions Precautions Precautions: Fall Precaution Comments: monitor HR and sats Restrictions Weight Bearing Restrictions: No Other Position/Activity Restrictions: consider height of head re sats    Mobility  Bed Mobility Overal bed mobility: Needs Assistance Bed Mobility: Supine to Sit;Sit to Supine     Supine to sit: Min guard;HOB elevated Sit to supine: Min guard;HOB elevated   General bed mobility comments: Pt able to perform all bed mob with HOB elevated and min guard for safety. Extra time and effort.  Transfers Overall transfer level: Needs assistance Equipment used: None Transfers: Sit to/from Stand Sit to Stand: Supervision;Min guard         General transfer comment: pt perfomed sit to  stand with S-min guard during session.Slight unsteadiness with mild trunk sway upon coming to stand, but no overt LOB.  Ambulation/Gait Ambulation/Gait assistance: Min guard Gait Distance (Feet): 80 Feet Assistive device: None;Straight cane Gait Pattern/deviations: Step-through pattern;Decreased step length - right;Decreased step length - left;Decreased stride length;Narrow base of support Gait velocity: reduced Gait velocity interpretation: <1.8 ft/sec, indicate of risk for recurrent falls General Gait Details: Pt looks inferiorly during gait, displaying decreased bil step length, despite cues to correct. Did improve with use of SPC in R hand though. Mild trunk sway and unsteadiness, but no overt LOB. Min guard for safety and to manage lines.   Stairs             Wheelchair Mobility    Modified Rankin (Stroke Patients Only)       Balance Overall balance assessment: Needs assistance Sitting-balance support: Feet supported Sitting balance-Leahy Scale: Good Sitting balance - Comments: Able to reach off BOS and keep balance to wash his trunk sitting EOB no UE support, min guard for safety.   Standing balance support: No upper extremity supported Standing balance-Leahy Scale: Poor Standing balance comment: 2 LOB when reaching off BOS to wash lower half of body without UE support, min guard for safety and recovery of balance. Mild trunk sway.                            Cognition Arousal/Alertness: Awake/alert Behavior During Therapy: WFL for tasks assessed/performed Overall Cognitive Status: Within Functional Limits for tasks assessed  Exercises      General Comments General comments (skin integrity, edema, etc.): Pt on 5L HFC upon arrival, SpO2 dropped to lowest of 73% in sitting or supine towards end of session, SpO2 lowest of 80% with gait on 6L HFNC, intermittent blank/unusual eyes with washing self in  standing resulting in LOB      Pertinent Vitals/Pain Pain Assessment: No/denies pain    Home Living                      Prior Function            PT Goals (current goals can now be found in the care plan section) Acute Rehab PT Goals Patient Stated Goal: to get home and feel better PT Goal Formulation: With patient Time For Goal Achievement: 06/29/20 Potential to Achieve Goals: Good Progress towards PT goals: Progressing toward goals    Frequency    Min 3X/week      PT Plan Current plan remains appropriate    Co-evaluation              AM-PAC PT "6 Clicks" Mobility   Outcome Measure  Help needed turning from your back to your side while in a flat bed without using bedrails?: None Help needed moving from lying on your back to sitting on the side of a flat bed without using bedrails?: None Help needed moving to and from a bed to a chair (including a wheelchair)?: A Little Help needed standing up from a chair using your arms (e.g., wheelchair or bedside chair)?: A Little Help needed to walk in hospital room?: A Little Help needed climbing 3-5 steps with a railing? : A Lot 6 Click Score: 19    End of Session Equipment Utilized During Treatment: Oxygen;Gait belt Activity Tolerance: Patient tolerated treatment well (limited by drop in O2 during session with mobility) Patient left: in bed;with call bell/phone within reach;with bed alarm set Nurse Communication: Mobility status;Other (comment) (sats) PT Visit Diagnosis: Unsteadiness on feet (R26.81);Muscle weakness (generalized) (M62.81);Other abnormalities of gait and mobility (R26.89)     Time: 1735-1801 PT Time Calculation (min) (ACUTE ONLY): 26 min  Charges:  $Gait Training: 8-22 mins $Neuromuscular Re-education: 8-22 mins                     Moishe Spice, PT, DPT Acute Rehabilitation Services  Pager: [phone] Office: [place] 06/23/2020, 6:13 PM

## 2020-06-23 NOTE — Progress Notes (Signed)
PROGRESS NOTE    Bradley French  [id number] DOB: [birth year] DOA: 06/01/2020 PCP: [facility]   Brief Narrative:   Bradley Dechene Breezeis a 80 y.o.malewith medical history significant ofCAD s/p CABG in 2006, HTN, IIDM, OSA on CPAP,COPD, presented with cough and increasing short of breath. Patient started to have symptoms of dry cough and shortness of breath 6 days ago, and he went to possible COVID-19 5 days ago. He also had subjective fever and chills, loss of appetite but no abdominal pain or diarrhea. He has not been eating or drinking much for the last 3 to 4 days. He was vaccinated for COVID x 2. Went to see his PCP who checked his pulse ox in the office and found low and told him to come to ED. COVID-positive with chest x-ray showed bilateral multifocal infiltrates.88% on room air, stabilized on 3L 95%.  06/23/20: Glucose coming up. Add low dose lantus. D-dimer coming down. Continue lovenox. Wean O2 as able. Still has a couple doses steroids and baricitnib to go.    Assessment & Plan:  Acute hypoxic respiratory failure secondary toCOVID PNA COPD exacerbation     - completed remdes 06/19/20     - continue steroids (End 1/24) and baricitinib (End 1/28)     - on HFNC 4L, wean O2 support as able     - recommend ambulation, proning; work w/ PT     - d-dimer drop from 10.11 to 3.85; continue lovenox  DM2     - glucoses up d/t steroids     - continue SSI, glucose checks, DM diet     - add low dose lantus while on steroids  Severe dehydration without creatinine elevation, POA     - increase PO intake as tolerated     - completed fluid recussitation  Deconditioning, ambulatory dysfunction, POA Failure to thrive     - continue dietary supplement, marinol     - PT  CAD status post CABG     - ASA, metoprolol  HTN     - metoprolol, amlodipine, losartan  DVT prophylaxis: lovneox Code Status: FULL Family Communication: None at bedside.   Status is:  Inpatient  Remains inpatient appropriate because:Inpatient level of care appropriate due to severity of illness   Dispo: The patient is from: Home              Anticipated d/c is to: SNF              Anticipated d/c date is: 3 days              Patient currently is not medically stable to d/c.   Difficult to place patient No  Consultants:   None  Procedures:   None  Antimicrobials:  . remdesivir   Subjective: No acute events ON.   Objective: Vitals:   06/22/20 1415 06/22/20 2200 06/22/20 2250 06/23/20 0748  BP: (!) 117/96 118/61  (!) 117/46  Pulse: 73 78  (!) 49  Resp: 18 (!) 24  18  Temp: (!) 97.5 F (36.4 C) (!) 97 F (36.1 C)  98 F (36.7 C)  TempSrc:  Axillary    SpO2: 90% 92% (!) 77% (!) 87%  Weight:      Height:        Intake/Output Summary (Last 24 hours) at 06/23/2020 0946 Last data filed at 06/23/2020 0811 Gross per 24 hour  Intake 600 ml  Output 1900 ml  Net -1300 ml   [employer]  07/10/20 1039  Weight: 108.9 kg    Examination:  General: 80 y.o. male resting in bed in NAD Eyes: PERRL, normal sclera ENMT: Nares patent w/o discharge, orophaynx clear, dentition normal, ears w/o discharge/lesions/ulcers Neck: Supple, trachea midline Cardiovascular: RRR, +S1, S2, no m/g/r, equal pulses throughout Respiratory: CTABL, no w/r/r, normal WOB GI: BS+, NDNT, no masses noted, no organomegaly noted MSK: No e/c/c Neuro: somnolent   Data Reviewed: I have personally reviewed following labs and imaging studies.  CBC: Recent Labs  Lab 06/17/20 0418 06/18/20 0409 06/19/20 0539 06/21/20 0319 06/22/20 0211 06/23/20 0255  WBC 8.6 6.3 6.5 8.1 11.4* 9.1  NEUTROABS 7.0 4.9 5.2  --   --   --   HGB 16.7 16.3 15.8 15.8 16.1 15.8  HCT 47.8 48.0 46.7 44.8 47.2 46.3  MCV 89.2 89.4 88.8 87.5 88.1 88.9  PLT 233 238 276 372 390 242   Basic Metabolic Panel: Recent Labs  Lab 06/17/20 0418 06/18/20 0409 06/19/20 0539 06/21/20 0319 06/22/20 0211  06/23/20 0255  NA 142 142 141 139 138 137  K 4.0 3.8 3.9 4.5 4.6 5.1  CL 104 104 104 104 102 102  CO2 24 27 27 27 25 26   GLUCOSE 227* 227* 222* 244* 218* 365*  BUN 13 11 15 17 18 22   CREATININE 0.79 0.79 0.86 0.85 0.84 0.88  CALCIUM 8.6* 8.7* 8.5* 8.6* 8.7* 9.1  MG 2.4 2.2 2.2  --   --   --   PHOS 2.5 1.8* 2.9  --   --   --    GFR: Estimated Creatinine Clearance: 93.5 mL/min (by C-G formula based on SCr of 0.88 mg/dL). Liver Function Tests: Recent Labs  Lab 06/17/20 0418 06/18/20 0409 06/19/20 0539  AST 57* 38 24  ALT 38 40 35  ALKPHOS 76 94 88  BILITOT 0.9 1.6* 1.5*  PROT 6.7 6.5 6.0*  ALBUMIN 2.8* 2.8* 2.8*   No results for input(s): LIPASE, AMYLASE in the last 168 hours. No results for input(s): AMMONIA in the last 168 hours. Coagulation Profile: No results for input(s): INR, PROTIME in the last 168 hours. Cardiac Enzymes: No results for input(s): CKTOTAL, CKMB, CKMBINDEX, TROPONINI in the last 168 hours. BNP (last 3 results) No results for input(s): PROBNP in the last 8760 hours. HbA1C: No results for input(s): HGBA1C in the last 72 hours. CBG: Recent Labs  Lab 06/22/20 1037 06/22/20 1358 06/22/20 1717 06/22/20 2113 06/23/20 0742  GLUCAP 279* 347* 247* 256* 274*   Lipid Profile: No results for input(s): CHOL, HDL, LDLCALC, TRIG, CHOLHDL, LDLDIRECT in the last 72 hours. Thyroid Function Tests: No results for input(s): TSH, T4TOTAL, FREET4, T3FREE, THYROIDAB in the last 72 hours. Anemia Panel: No results for input(s): VITAMINB12, FOLATE, FERRITIN, TIBC, IRON, RETICCTPCT in the last 72 hours. Sepsis Labs: No results for input(s): PROCALCITON, LATICACIDVEN in the last 168 hours.  No results found for this or any previous visit (from the past 240 hour(s)).    Radiology Studies: No results found.   Scheduled Meds: . amLODipine  10 mg Oral Q2000  . aspirin EC  81 mg Oral Q2000  . baricitinib  4 mg Oral Daily  . diclofenac Sodium  2 g Topical Q2000  .  dronabinol  2.5 mg Oral BID AC  . enoxaparin (LOVENOX) injection  40 mg Subcutaneous Q12H  . feeding supplement  237 mL Oral BID BM  . insulin aspart  0-15 Units Subcutaneous TID WC  . Ipratropium-Albuterol  1 puff Inhalation TID  .  losartan  25 mg Oral Q2000  . methylPREDNISolone (SOLU-MEDROL) injection  40 mg Intravenous Q12H  . metoprolol tartrate  50 mg Oral BID  . nicotine  21 mg Transdermal Daily  . polyethylene glycol  17 g Oral BID   Continuous Infusions:   LOS: 9 days    Time spent: 35 minutes spent in the coordination of care today.    Jonnie Finner, DO [facility]  If 7PM-7AM, please contact night-coverage [email or web address] 06/23/2020, 9:46 AM

## 2020-06-23 NOTE — Progress Notes (Signed)
Pt removed oxygen and noted at an O2 sats of 77%. Oxygen replaced with a gradual increment to 88%.

## 2020-06-23 NOTE — Progress Notes (Signed)
Spoke with wife and updated on patient care.

## 2020-06-24 ENCOUNTER — Inpatient Hospital Stay (HOSPITAL_COMMUNITY): Payer: No Typology Code available for payment source

## 2020-06-24 DIAGNOSIS — U071 COVID-19: Secondary | ICD-10-CM | POA: Diagnosis not present

## 2020-06-24 LAB — BASIC METABOLIC PANEL
Anion gap: 8 (ref 5–15)
BUN: 23 mg/dL (ref 8–23)
CO2: 26 mmol/L (ref 22–32)
Calcium: 9 mg/dL (ref 8.9–10.3)
Chloride: 102 mmol/L (ref 98–111)
Creatinine, Ser: 0.96 mg/dL (ref 0.61–1.24)
GFR, Estimated: >60 mL/min (ref >60)
Glucose, Bld: 322 mg/dL — ABNORMAL HIGH (ref 70–99)
Potassium: 5.4 mmol/L — ABNORMAL HIGH (ref 3.5–5.1)
Sodium: 136 mmol/L (ref 135–145)

## 2020-06-24 LAB — GLUCOSE, CAPILLARY
Glucose-Capillary: 289 mg/dL — ABNORMAL HIGH (ref 70–99)
Glucose-Capillary: 312 mg/dL — ABNORMAL HIGH (ref 70–99)
Glucose-Capillary: 372 mg/dL — ABNORMAL HIGH (ref 70–99)
Glucose-Capillary: 266 mg/dL — ABNORMAL HIGH (ref 70–99)

## 2020-06-24 LAB — CBC
HCT: 47.3 % (ref 39.0–52.0)
Hemoglobin: 16 g/dL (ref 13.0–17.0)
MCH: 30.1 pg (ref 26.0–34.0)
MCHC: 33.8 g/dL (ref 30.0–36.0)
MCV: 89.1 fL (ref 80.0–100.0)
Platelets: 391 10*3/uL (ref 150–400)
RBC: 5.31 MIL/uL (ref 4.22–5.81)
RDW: 13 % (ref 11.5–15.5)
WBC: 12.4 10*3/uL — ABNORMAL HIGH (ref 4.0–10.5)
nRBC: 0 % (ref 0.0–0.2)

## 2020-06-24 LAB — D-DIMER, QUANTITATIVE: D-Dimer, Quant: 2.34 ug/mL-FEU — ABNORMAL HIGH (ref 0.00–0.50)

## 2020-06-24 LAB — C-REACTIVE PROTEIN: CRP: 1.1 mg/dL — ABNORMAL HIGH (ref <1.0)

## 2020-06-24 MED ORDER — INSULIN GLARGINE 100 UNIT/ML ~~LOC~~ SOLN
10.0000 [IU] | Freq: Every day | SUBCUTANEOUS | Status: DC
  Administered 2020-06-24: 10 [IU] via SUBCUTANEOUS
  Filled 2020-06-24 (×2): qty 0.1

## 2020-06-24 NOTE — Progress Notes (Signed)
PROGRESS NOTE    Bradley French  [id number] DOB: June 09, 1940 DOA: 06/18/2020 PCP: [facility]   Brief Narrative:   Bradley French a 80 y.o.malewith medical history significant ofCAD s/p CABG in 2006, HTN, IIDM, OSA on CPAP,COPD, presented with cough and increasing short of breath. Patient started to have symptoms of dry cough and shortness of breath 6 days ago, and he went to possible COVID-19 5 days ago. He also had subjective fever and chills, loss of appetite but no abdominal pain or diarrhea. He has not been eating or drinking much for the last 3 to 4 days. He was vaccinated for COVID x 2. Went to see his PCP who checked his pulse ox in the office and found low and told him to come to ED. COVID-positive with chest x-ray showed bilateral multifocal infiltrates.88% on room air, stabilized on 3L 95%.  1/24: O2 needs going up. Check CXR. Mobilize as much as possible. Glucose not optimal. Increase lantus.    Assessment & Plan: Acute hypoxic respiratory failure secondary toCOVID PNA COPD exacerbation     - completed remdes 06/19/20     - continue steroids (End 1/24) and baricitinib (End 1/28)     - on HFNC 4L, wean O2 support as able     - recommend ambulation, proning; work w/ PT     - 1/24: D-dimer continues to drop, continue lovenox; O2 requirement going up. Rpt CXR.  DM2     - glucoses up d/t steroids     - continue SSI, glucose checks, DM diet     - 1/24: increase lantus tonight  Severe dehydration without creatinine elevation, POA     - increase PO intake as tolerated     - completed fluid recussitation  Deconditioning, ambulatory dysfunction, POA Failure to thrive     - continue dietary supplement, marinol     - PT: SNF vs HHPT w/ 24-hr supervision, still desats with ambulation/mobility  CAD status post CABG     - ASA, metoprolol  HTN     - metoprolol, amlodipine, losartan  DVT prophylaxis: lovenox Code Status: FULL Family  Communication: attempted call to spouse at [phone]; received VM only   Status is: Inpatient  Remains inpatient appropriate because:Inpatient level of care appropriate due to severity of illness   Dispo: The patient is from: Home              Anticipated d/c is to: SNF              Anticipated d/c date is: > 3 days              Patient currently is not medically stable to d/c.   Difficult to place patient No  Consultants:   None  Procedures:   None  Antimicrobials:  . Remdes   Subjective: O2 needs going up.  Objective: Vitals:   06/24/20 0300 06/24/20 0428 06/24/20 0510 06/24/20 0638  BP:   118/74   Pulse:   (!) 58   Resp:   18   Temp:   97.7 F (36.5 C)   TempSrc:   Oral   SpO2: 91% 95% 95% 91%  Weight:      Height:        Intake/Output Summary (Last 24 hours) at 06/24/2020 0708 Last data filed at 06/24/2020 0609 Gross per 24 hour  Intake -  Output 2000 ml  Net -2000 ml   Filed Weights   06/22/2020 1039  Weight: 108.9  kg    Examination:  General: 80 y.o. male resting in bed in NAD Eyes: PERRL, normal sclera ENMT: Nares patent w/o discharge, orophaynx clear, dentition normal, ears w/o discharge/lesions/ulcers Neck: Supple, trachea midline Cardiovascular: RRR, +S1, S2, no m/g/r, equal pulses throughout Respiratory: CTABL, no w/r/r, normal WOB GI: BS+, NDNT, no masses noted, no organomegaly noted MSK: No e/c/c Skin: No rashes, bruises, ulcerations noted Neuro: somnolent   Data Reviewed: I have personally reviewed following labs and imaging studies.  CBC: Recent Labs  Lab 06/18/20 0409 06/19/20 0539 06/21/20 0319 06/22/20 0211 06/23/20 0255 06/24/20 0419  WBC 6.3 6.5 8.1 11.4* 9.1 12.4*  NEUTROABS 4.9 5.2  --   --   --   --   HGB 16.3 15.8 15.8 16.1 15.8 16.0  HCT 48.0 46.7 44.8 47.2 46.3 47.3  MCV 89.4 88.8 87.5 88.1 88.9 89.1  PLT 238 276 372 390 368 782   Basic Metabolic Panel: Recent Labs  Lab 06/18/20 0409 06/19/20 0539  06/21/20 0319 06/22/20 0211 06/23/20 0255 06/24/20 0419  NA 142 141 139 138 137 136  K 3.8 3.9 4.5 4.6 5.1 5.4*  CL 104 104 104 102 102 102  CO2 27 27 27 25 26 26   GLUCOSE 227* 222* 244* 218* 365* 322*  BUN 11 15 17 18 22 23   CREATININE 0.79 0.86 0.85 0.84 0.88 0.96  CALCIUM 8.7* 8.5* 8.6* 8.7* 9.1 9.0  MG 2.2 2.2  --   --   --   --   PHOS 1.8* 2.9  --   --   --   --    GFR: Estimated Creatinine Clearance: 85.8 mL/min (by C-G formula based on SCr of 0.96 mg/dL). Liver Function Tests: Recent Labs  Lab 06/18/20 0409 06/19/20 0539  AST 38 24  ALT 40 35  ALKPHOS 94 88  BILITOT 1.6* 1.5*  PROT 6.5 6.0*  ALBUMIN 2.8* 2.8*   No results for input(s): LIPASE, AMYLASE in the last 168 hours. No results for input(s): AMMONIA in the last 168 hours. Coagulation Profile: No results for input(s): INR, PROTIME in the last 168 hours. Cardiac Enzymes: No results for input(s): CKTOTAL, CKMB, CKMBINDEX, TROPONINI in the last 168 hours. BNP (last 3 results) No results for input(s): PROBNP in the last 8760 hours. HbA1C: No results for input(s): HGBA1C in the last 72 hours. CBG: Recent Labs  Lab 06/22/20 2113 06/23/20 0742 06/23/20 1236 06/23/20 1628 06/23/20 2019  GLUCAP 256* 274* 357* 339* 245*   Lipid Profile: No results for input(s): CHOL, HDL, LDLCALC, TRIG, CHOLHDL, LDLDIRECT in the last 72 hours. Thyroid Function Tests: No results for input(s): TSH, T4TOTAL, FREET4, T3FREE, THYROIDAB in the last 72 hours. Anemia Panel: No results for input(s): VITAMINB12, FOLATE, FERRITIN, TIBC, IRON, RETICCTPCT in the last 72 hours. Sepsis Labs: No results for input(s): PROCALCITON, LATICACIDVEN in the last 168 hours.  No results found for this or any previous visit (from the past 240 hour(s)).    Radiology Studies: No results found.   Scheduled Meds: . amLODipine  10 mg Oral Q2000  . aspirin EC  81 mg Oral Q2000  . baricitinib  4 mg Oral Daily  . diclofenac Sodium  2 g Topical  Q2000  . dronabinol  2.5 mg Oral BID AC  . enoxaparin (LOVENOX) injection  40 mg Subcutaneous Q12H  . feeding supplement  237 mL Oral BID BM  . insulin aspart  0-15 Units Subcutaneous TID WC  . insulin glargine  5 Units Subcutaneous QHS  .  Ipratropium-Albuterol  1 puff Inhalation TID  . losartan  25 mg Oral Q2000  . methylPREDNISolone (SOLU-MEDROL) injection  40 mg Intravenous Q12H  . metoprolol tartrate  50 mg Oral BID  . nicotine  21 mg Transdermal Daily  . polyethylene glycol  17 g Oral BID   Continuous Infusions:   LOS: 10 days    Time spent: 25 minutes spent in the coordination of care today.   Jonnie Finner, DO [facility]  If 7PM-7AM, please contact night-coverage [email or web address] 06/24/2020, 7:08 AM

## 2020-06-24 NOTE — Progress Notes (Signed)
Pt unable to wear CPAP.

## 2020-06-24 NOTE — Plan of Care (Signed)
  Problem: Education: Goal: Knowledge of risk factors and measures for prevention of condition will improve Outcome: Progressing   

## 2020-06-24 NOTE — TOC Progression Note (Signed)
Transition of Care [facility]) - Progression Note    Patient Details  Name: Bradley French MRN: [id number] Date of Birth: [birth year]  [facility]) CM/SW Contact  Joanne Chars, LCSW Phone Number: 06/24/2020, 3:07 PM  Clinical Narrative: CSW made the following calls regarding [facility] SNF referral: [facility] reports bed offer is a mistake, can't take [place], but [place] location can.  She will review for [place]: LM with [facility] in [place]: Earlie Server asked that referral be faxed to [phone].  This was done. [facility]: Toni Amend, spoke with [employer]. No beds currently [place], [place]--spoke with [place] who asked that referral be faxed to [phone].  This was done.  The following facilities have declined referral in the hub: [place], [place], [place], [place], [place], [facility], [facility], [place], [place].     Expected Discharge Plan: [place] Barriers to Discharge: Continued Medical Work up,SNF Pending bed offer  Expected Discharge Plan and Services Expected Discharge Plan: [place] Choice: [place] arrangements for the past 2 months: Single Family Home                                       Social Determinants of Health (SDOH) Interventions    Readmission Risk Interventions No flowsheet data found.

## 2020-06-25 DIAGNOSIS — U071 COVID-19: Secondary | ICD-10-CM | POA: Diagnosis not present

## 2020-06-25 LAB — BASIC METABOLIC PANEL
Anion gap: 10 (ref 5–15)
BUN: 22 mg/dL (ref 8–23)
CO2: 26 mmol/L (ref 22–32)
Calcium: 9.3 mg/dL (ref 8.9–10.3)
Chloride: 101 mmol/L (ref 98–111)
Creatinine, Ser: 0.82 mg/dL (ref 0.61–1.24)
GFR, Estimated: >60 mL/min (ref >60)
Glucose, Bld: 229 mg/dL — ABNORMAL HIGH (ref 70–99)
Potassium: 5.3 mmol/L — ABNORMAL HIGH (ref 3.5–5.1)
Sodium: 137 mmol/L (ref 135–145)

## 2020-06-25 LAB — CBC
HCT: 48.6 % (ref 39.0–52.0)
Hemoglobin: 17.1 g/dL — ABNORMAL HIGH (ref 13.0–17.0)
MCH: 30.9 pg (ref 26.0–34.0)
MCHC: 35.2 g/dL (ref 30.0–36.0)
MCV: 87.7 fL (ref 80.0–100.0)
Platelets: 414 10*3/uL — ABNORMAL HIGH (ref 150–400)
RBC: 5.54 MIL/uL (ref 4.22–5.81)
RDW: 13.2 % (ref 11.5–15.5)
WBC: 11.7 10*3/uL — ABNORMAL HIGH (ref 4.0–10.5)
nRBC: 0 % (ref 0.0–0.2)

## 2020-06-25 LAB — MAGNESIUM: Magnesium: 2.5 mg/dL — ABNORMAL HIGH (ref 1.7–2.4)

## 2020-06-25 LAB — D-DIMER, QUANTITATIVE: D-Dimer, Quant: 3.03 ug/mL-FEU — ABNORMAL HIGH (ref 0.00–0.50)

## 2020-06-25 LAB — GLUCOSE, CAPILLARY
Glucose-Capillary: 272 mg/dL — ABNORMAL HIGH (ref 70–99)
Glucose-Capillary: 372 mg/dL — ABNORMAL HIGH (ref 70–99)
Glucose-Capillary: 268 mg/dL — ABNORMAL HIGH (ref 70–99)

## 2020-06-25 LAB — C-REACTIVE PROTEIN: CRP: 0.6 mg/dL (ref <1.0)

## 2020-06-25 MED ORDER — INSULIN ASPART 100 UNIT/ML ~~LOC~~ SOLN
0.0000 [IU] | Freq: Three times a day (TID) | SUBCUTANEOUS | Status: DC
  Administered 2020-06-25: 11 [IU] via SUBCUTANEOUS
  Administered 2020-06-26: 8 [IU] via SUBCUTANEOUS
  Administered 2020-06-26: 5 [IU] via SUBCUTANEOUS
  Administered 2020-06-26: 11 [IU] via SUBCUTANEOUS
  Administered 2020-06-27: 15 [IU] via SUBCUTANEOUS
  Administered 2020-06-27: 5 [IU] via SUBCUTANEOUS
  Administered 2020-06-27: 11 [IU] via SUBCUTANEOUS
  Administered 2020-06-28: 5 [IU] via SUBCUTANEOUS
  Administered 2020-06-28: 3 [IU] via SUBCUTANEOUS
  Administered 2020-06-28: 15 [IU] via SUBCUTANEOUS
  Administered 2020-06-29: 13 [IU] via SUBCUTANEOUS
  Administered 2020-06-29: 10 [IU] via SUBCUTANEOUS
  Administered 2020-06-29: 5 [IU] via SUBCUTANEOUS
  Administered 2020-06-30: 8 [IU] via SUBCUTANEOUS
  Administered 2020-06-30: 11 [IU] via SUBCUTANEOUS
  Administered 2020-06-30: 3 [IU] via SUBCUTANEOUS
  Administered 2020-07-01: 15 [IU] via SUBCUTANEOUS
  Administered 2020-07-01: 8 [IU] via SUBCUTANEOUS
  Administered 2020-07-01: 5 [IU] via SUBCUTANEOUS
  Administered 2020-07-02: 8 [IU] via SUBCUTANEOUS
  Administered 2020-07-02: 3 [IU] via SUBCUTANEOUS
  Administered 2020-07-02: 2 [IU] via SUBCUTANEOUS
  Administered 2020-07-03 (×2): 3 [IU] via SUBCUTANEOUS
  Administered 2020-07-04: 5 [IU] via SUBCUTANEOUS
  Administered 2020-07-05: 2 [IU] via SUBCUTANEOUS
  Administered 2020-07-05: 3 [IU] via SUBCUTANEOUS
  Administered 2020-07-05 – 2020-07-06 (×2): 2 [IU] via SUBCUTANEOUS
  Administered 2020-07-06: 3 [IU] via SUBCUTANEOUS
  Administered 2020-07-06: 2 [IU] via SUBCUTANEOUS
  Administered 2020-07-07 – 2020-07-08 (×2): 8 [IU] via SUBCUTANEOUS
  Administered 2020-07-08: 5 [IU] via SUBCUTANEOUS
  Administered 2020-07-08 – 2020-07-09 (×2): 8 [IU] via SUBCUTANEOUS
  Administered 2020-07-09: 5 [IU] via SUBCUTANEOUS
  Administered 2020-07-09: 2 [IU] via SUBCUTANEOUS

## 2020-06-25 MED ORDER — INSULIN GLARGINE 100 UNIT/ML ~~LOC~~ SOLN
12.0000 [IU] | Freq: Every day | SUBCUTANEOUS | Status: DC
  Administered 2020-06-25 – 2020-06-26 (×2): 12 [IU] via SUBCUTANEOUS
  Filled 2020-06-25 (×3): qty 0.12

## 2020-06-25 MED ORDER — INSULIN ASPART 100 UNIT/ML ~~LOC~~ SOLN
0.0000 [IU] | Freq: Every day | SUBCUTANEOUS | Status: DC
  Administered 2020-06-25 – 2020-06-26 (×2): 3 [IU] via SUBCUTANEOUS
  Administered 2020-06-27 – 2020-06-28 (×2): 4 [IU] via SUBCUTANEOUS
  Administered 2020-06-29: 5 [IU] via SUBCUTANEOUS
  Administered 2020-06-30 – 2020-07-01 (×2): 3 [IU] via SUBCUTANEOUS
  Administered 2020-07-02 – 2020-07-04 (×2): 2 [IU] via SUBCUTANEOUS
  Administered 2020-07-07 – 2020-07-08 (×2): 4 [IU] via SUBCUTANEOUS

## 2020-06-25 MED ORDER — SODIUM ZIRCONIUM CYCLOSILICATE 5 G PO PACK
5.0000 g | PACK | Freq: Two times a day (BID) | ORAL | Status: AC
  Administered 2020-06-25: 5 g via ORAL
  Filled 2020-06-25: qty 1

## 2020-06-25 NOTE — Progress Notes (Signed)
This nurse notified by central telemetry that patient had 2.54 second pause at 1733 prior to this nurse shift start.Patient currently sinus rhythm on monitor denies pain or discomfort at present time.

## 2020-06-25 NOTE — Plan of Care (Signed)

## 2020-06-25 NOTE — Progress Notes (Signed)
PROGRESS NOTE    Bradley French  IHK:[id number] DOB: April 18, 1941 DOA: 06/25/2020 [facility]: [facility]   Brief Narrative:   Bradley Mcquerry Breezeis a 80 y.o.malewith medical history significant ofCAD s/p CABG in 2006, HTN, IIDM, OSA on CPAP,COPD, presented with cough and increasing short of breath. Patient started to have symptoms of dry cough and shortness of breath 6 days ago, and he went to possible COVID-19 5 days ago. He also had subjective fever and chills, loss of appetite but no abdominal pain or diarrhea. He has not been eating or drinking much for the last 3 to 4 days. He was vaccinated for COVID x 2. Went to see his PCP who checked his pulse ox in the office and found low and told him to come to ED. COVID-positive with chest x-ray showed bilateral multifocal infiltrates.88% on room air, stabilized on 3L 95%.  1/25: O2 needs stable. CXR w/ worsening infiltrate. Check procal. Needs to mobilize, use IS. Increase lantus/SSI. Has bed offer, but pt "has to think about it."  Assessment & Plan: Acute hypoxic respiratory failure secondary toCOVID PNA COPD exacerbation - completed remdes 06/19/20 - continue steroids and baricitinib (End 1/28) - on HFNC 6L, wean O2 support as able - recommend ambulation, proning; work w/ PT - 1/25: continue solumedrol, lovenox, combivent; mobilize, mobilize, mobilize, use IS; CXR w/ worsening infiltrates  DM2 - glucoses up d/t steroids - continue SSI, glucose checks, DM diet - 1/25: increase SSI, lantus tonight  Severe dehydration without creatinine elevation, POA - increase PO intake as tolerated - completed fluid recussitation  Deconditioning, ambulatory dysfunction, POA Failure to thrive - continue dietary supplement, marinol - PT: SNF vs HHPT w/ 24-hr supervision, still desats with ambulation/mobility  CAD status post CABG - ASA, metoprolol  HTN - metoprolol, amlodipine,  losartan  DVT prophylaxis: lovenox Code Status: FULL Family Communication: Spoke with wife by phone. Status is: Inpatient  Remains inpatient appropriate because:Inpatient level of care appropriate due to severity of illness   Dispo: The patient is from: Home              Anticipated d/c is to: SNF              Anticipated d/c date is: 3 days              Patient currently is not medically stable to d/c.   Difficult to place patient No  Consultants:   None  Procedures:   None  Subjective: "Don't mess with that."  Objective: Vitals:   06/24/20 2129 06/25/20 0508 06/25/20 0800 06/25/20 0823  BP:  135/68  (!) 127/98  Pulse:  (!) 57  80  Resp:  18 20 18   Temp: 98 F (36.7 C) 97.9 F (36.6 C)  98.1 F (36.7 C)  TempSrc: Oral Oral    SpO2:  93% 90%   Weight:      Height:        Intake/Output Summary (Last 24 hours) at 06/25/2020 1334 Last data filed at 06/25/2020 0400 Gross per 24 hour  Intake 120 ml  Output 675 ml  Net -555 ml   Filed Weights   06/06/2020 1039  Weight: 108.9 kg    Examination:  General: 80 y.o. male resting in bed in NAD Eyes: PERRL, normal sclera ENMT: Nares patent w/o discharge, orophaynx clear, dentition normal, ears w/o discharge/lesions/ulcers Neck: Supple, trachea midline Cardiovascular: RRR, +S1, S2, no m/g/r, equal pulses throughout Respiratory: decreased at bases, no w/r/r, normal WOB on  6L HFNC GI: BS+, NDNT, no masses noted, no organomegaly noted MSK: No e/c/c Skin: No rashes, bruises, ulcerations noted Neuro: A&O x 3, no focal deficits Psyc: Flat affect, calm/cooperative   Data Reviewed: I have personally reviewed following labs and imaging studies.  CBC: Recent Labs  Lab 06/19/20 0539 06/21/20 0319 06/22/20 0211 06/23/20 0255 06/24/20 0419 06/25/20 0331  WBC 6.5 8.1 11.4* 9.1 12.4* 11.7*  NEUTROABS 5.2  --   --   --   --   --   HGB 15.8 15.8 16.1 15.8 16.0 17.1*  HCT 46.7 44.8 47.2 46.3 47.3 48.6  MCV 88.8 87.5  88.1 88.9 89.1 87.7  PLT 276 372 390 368 391 081*   Basic Metabolic Panel: Recent Labs  Lab 06/19/20 0539 06/21/20 0319 06/22/20 0211 06/23/20 0255 06/24/20 0419 06/25/20 0331  NA 141 139 138 137 136 137  K 3.9 4.5 4.6 5.1 5.4* 5.3*  CL 104 104 102 102 102 101  CO2 27 27 25 26 26 26   GLUCOSE 222* 244* 218* 365* 322* 229*  BUN 15 17 18 22 23 22   CREATININE 0.86 0.85 0.84 0.88 0.96 0.82  CALCIUM 8.5* 8.6* 8.7* 9.1 9.0 9.3  MG 2.2  --   --   --   --  2.5*  PHOS 2.9  --   --   --   --   --    GFR: Estimated Creatinine Clearance: 100.4 mL/min (by C-G formula based on SCr of 0.82 mg/dL). Liver Function Tests: Recent Labs  Lab 06/19/20 0539  AST 24  ALT 35  ALKPHOS 88  BILITOT 1.5*  PROT 6.0*  ALBUMIN 2.8*   No results for input(s): LIPASE, AMYLASE in the last 168 hours. No results for input(s): AMMONIA in the last 168 hours. Coagulation Profile: No results for input(s): INR, PROTIME in the last 168 hours. Cardiac Enzymes: No results for input(s): CKTOTAL, CKMB, CKMBINDEX, TROPONINI in the last 168 hours. BNP (last 3 results) No results for input(s): PROBNP in the last 8760 hours. HbA1C: No results for input(s): HGBA1C in the last 72 hours. CBG: Recent Labs  Lab 06/24/20 0712 06/24/20 1234 06/24/20 1624 06/24/20 2133 06/25/20 0821  GLUCAP 289* 312* 372* 266* 272*   Lipid Profile: No results for input(s): CHOL, HDL, LDLCALC, TRIG, CHOLHDL, LDLDIRECT in the last 72 hours. Thyroid Function Tests: No results for input(s): TSH, T4TOTAL, FREET4, T3FREE, THYROIDAB in the last 72 hours. Anemia Panel: No results for input(s): VITAMINB12, FOLATE, FERRITIN, TIBC, IRON, RETICCTPCT in the last 72 hours. Sepsis Labs: No results for input(s): PROCALCITON, LATICACIDVEN in the last 168 hours.  No results found for this or any previous visit (from the past 240 hour(s)).    Radiology Studies: DG CHEST PORT 1 VIEW  Result Date: 06/24/2020 CLINICAL DATA:  Weakness and  shortness of breath acute COVID EXAM: PORTABLE CHEST 1 VIEW COMPARISON:  June 29, 2020 FINDINGS: Low lung volumes. Left greater than right patchy opacities. No significant pleural effusion. Stable cardiomediastinal contours. IMPRESSION: Left greater than right patchy opacities likely reflecting COVID pneumonia. Aeration has worsened. Electronically Signed   By: Macy Mis M.D.   On: 06/24/2020 15:32     Scheduled Meds: . amLODipine  10 mg Oral Q2000  . aspirin EC  81 mg Oral Q2000  . baricitinib  4 mg Oral Daily  . diclofenac Sodium  2 g Topical Q2000  . dronabinol  2.5 mg Oral BID AC  . enoxaparin (LOVENOX) injection  40 mg Subcutaneous Q12H  .  feeding supplement  237 mL Oral BID BM  . insulin aspart  0-15 Units Subcutaneous TID WC  . insulin glargine  10 Units Subcutaneous QHS  . Ipratropium-Albuterol  1 puff Inhalation TID  . methylPREDNISolone (SOLU-MEDROL) injection  40 mg Intravenous Q12H  . nicotine  21 mg Transdermal Daily  . polyethylene glycol  17 g Oral BID   Continuous Infusions:   LOS: 11 days    Time spent: 25 minutes spent in the coordination of care today.    Jonnie Finner, DO [facility]  If 7PM-7AM, please contact night-coverage [email or web address] 06/25/2020, 1:34 PM

## 2020-06-25 NOTE — TOC Progression Note (Addendum)
Transition of Care [facility]) - Progression Note    Patient Details  Name: Bradley French MRN: [id number] Date of Birth: 1941-04-04  [facility]) CM/SW Contact  Joanne Chars, LCSW Phone Number: 06/25/2020, 9:48 AM  Clinical Narrative: Ebony Hail at [facility] called back, they can offer bed at [facility].  CSW spoke with pt who asked about closer options, discussed that other VA options are further away than [place]-pt will think about it.    1430: Phone call from Micah Flesher, pt wife.  She asked about SNF options, would prefer something in [place] as this is closer to their home.  She asked about possible facetime with pt--spoke to Asst Director who will work on this.   1545: LM with [facility] asking for response to this referral.  1620: phone call De[facility], [facility].  She does have a bed but would need [facility] approval to use it--she will email and ask for approval.     Expected Discharge Plan: [place] Barriers to Discharge: Continued Medical Work up,SNF Pending bed offer  Expected Discharge Plan and Services Expected Discharge Plan: [place] Choice: [place] arrangements for the past 2 months: Single Family Home                                       Social Determinants of Health (SDOH) Interventions    Readmission Risk Interventions No flowsheet data found.

## 2020-06-25 NOTE — Progress Notes (Signed)
Physical Therapy Treatment Patient Details Name: Bradley French MRN: [id number] DOB: [birth year] Today's Date: 06/25/2020    History of Present Illness 80 yo male with onset of SOB and cough.  Has been home with his wife, had poor intake and feeling bad.  PMHx:  CABG, HTN, DM, OSA, CPAP, COPD, CAD, anxiety, cataracts, colon polyps, depression, ulcer,    PT Comments    Pt was agreeable to get up OOB to the recliner chair, but refused any further mobility.  He had his O2 pulled off when I entered and his resting O2 sat was 80%, re-applied 7 L O2NC and pt increased to 88% or higher during mobility.  He refused gait progression or further exercise after he was up and appeared very fidgety/restless, messing with his O2, wires, perseverating on finding his glasses (which I did).  He seem a bit confused, but I am unsure of baseline and he seemed irritable when I attempted to assess cognition. PT will continue to follow acutely for safe mobility progression.   Follow Up Recommendations  SNF     Equipment Recommendations  Other (comment) (O2)    Recommendations for Other Services       Precautions / Restrictions Precautions Precautions: Fall Precaution Comments: monitor sats    Mobility  Bed Mobility Overal bed mobility: Needs Assistance Bed Mobility: Supine to Sit     Supine to sit: Supervision     General bed mobility comments: Supervision for safety as pt got unsafely close to the side of the bed when he sat up.  Transfers Overall transfer level: Needs assistance Equipment used: Straight cane Transfers: Sit to/from [facility] Sit to Stand: Min guard Stand pivot transfers: Min guard       General transfer comment: Min guard assist to stand and take pivotal steps to the recliner chair.  Ambulation/Gait             General Gait Details: After transfer to chair pt refused to get up again.   Stairs             Wheelchair Mobility     Modified Rankin (Stroke Patients Only)       Balance Overall balance assessment: Needs assistance Sitting-balance support: Feet supported Sitting balance-Leahy Scale: Fair     Standing balance support: Bilateral upper extremity supported Standing balance-Leahy Scale: Poor Standing balance comment: reliant on cane and PT support.  After transfer to chair pt did not agree to get up again, even after recovering his breathing.                            Cognition Arousal/Alertness: Awake/alert Behavior During Therapy: Flat affect Overall Cognitive Status: Difficult to assess                                 General Comments: Pt not saying much, seems irritable with PT, distracted by looking for his glasses (found under the bed) and then seemed distracted by other things, frequently messing with his O2 tubing and lines.  I did not ask true orientation questions as I felt he would be more irritable if I implied he had cognitive deficits.      Exercises      General Comments General comments (skin integrity, edema, etc.): Pt found sleeping with O2 pulled off.  O2 sats 80% on RA, reapplied 7 L O2 [place] and  pt's sats increased to 88% or higher (when wave was good).      Pertinent Vitals/Pain Pain Assessment: Faces Faces Pain Scale: No hurt    Home Living                      Prior Function            PT Goals (current goals can now be found in the care plan section) Acute Rehab PT Goals Patient Stated Goal: pt did not say much during our session, agreeable to get up Progress towards PT goals: Progressing toward goals    Frequency    Min 2X/week      PT Plan Current plan remains appropriate    Co-evaluation              AM-PAC PT "6 Clicks" Mobility   Outcome Measure  Help needed turning from your back to your side while in a flat bed without using bedrails?: A Little Help needed moving from lying on your back to sitting on  the side of a flat bed without using bedrails?: A Little Help needed moving to and from a bed to a chair (including a wheelchair)?: A Little Help needed standing up from a chair using your arms (e.g., wheelchair or bedside chair)?: A Little Help needed to walk in hospital room?: A Little Help needed climbing 3-5 steps with a railing? : A Lot 6 Click Score: 17    End of Session Equipment Utilized During Treatment: Oxygen Activity Tolerance: Other (comment) (pt self limiting.)     PT Visit Diagnosis: Unsteadiness on feet (R26.81);Muscle weakness (generalized) (M62.81);Other abnormalities of gait and mobility (R26.89)     Time: 8270-7867 PT Time Calculation (min) (ACUTE ONLY): 27 min  Charges:  $Therapeutic Activity: 23-37 mins                    Verdene Lennert, PT, DPT  Acute Rehabilitation [phone] pager [phone]) [phone] office

## 2020-06-25 NOTE — Progress Notes (Signed)
Inpatient Diabetes Program Recommendations  AACE/ADA: New Consensus Statement on Inpatient Glycemic Control (2015)  Target Ranges:  Prepandial:   less than 140 mg/dL      Peak postprandial:   less than 180 mg/dL (1-2 hours)      Critically ill patients:  140 - 180 mg/dL   Lab Results  Component Value Date   GLUCAP 272 (H) 06/25/2020   HGBA1C 5.9 (H) 04/12/2020    Review of Glycemic Control  Diabetes history:  Outpatient Diabetes medications: metformin 500 mg after breakfast Current orders for Inpatient glycemic control: Lantus 10 units QHS, Novolog 0-15 units TID with meals  On Solumedrol 40 Q12H  Inpatient Diabetes Program Recommendations:     Increase Lantus to 12 units QHS Add Novolog 3 units TID with meals Add Novolog 0-5 units QHS  Continue to follow.  Thank you. Lorenda Peck, RD, LDN, CDE Inpatient Diabetes Coordinator [phone]

## 2020-06-26 DIAGNOSIS — U071 COVID-19: Secondary | ICD-10-CM | POA: Diagnosis not present

## 2020-06-26 LAB — CBC WITH DIFFERENTIAL/PLATELET
Abs Immature Granulocytes: 0.23 10*3/uL — ABNORMAL HIGH (ref 0.00–0.07)
Basophils Absolute: 0 10*3/uL (ref 0.0–0.1)
Basophils Relative: 0 %
Eosinophils Absolute: 0 10*3/uL (ref 0.0–0.5)
Eosinophils Relative: 0 %
HCT: 47 % (ref 39.0–52.0)
Hemoglobin: 16.4 g/dL (ref 13.0–17.0)
Immature Granulocytes: 2 %
Lymphocytes Relative: 4 %
Lymphs Abs: 0.4 10*3/uL — ABNORMAL LOW (ref 0.7–4.0)
MCH: 30.7 pg (ref 26.0–34.0)
MCHC: 34.9 g/dL (ref 30.0–36.0)
MCV: 88 fL (ref 80.0–100.0)
Monocytes Absolute: 0.6 10*3/uL (ref 0.1–1.0)
Monocytes Relative: 6 %
Neutro Abs: 9.6 10*3/uL — ABNORMAL HIGH (ref 1.7–7.7)
Neutrophils Relative %: 88 %
Platelets: 395 10*3/uL (ref 150–400)
RBC: 5.34 MIL/uL (ref 4.22–5.81)
RDW: 13.3 % (ref 11.5–15.5)
WBC: 10.9 10*3/uL — ABNORMAL HIGH (ref 4.0–10.5)
nRBC: 0 % (ref 0.0–0.2)

## 2020-06-26 LAB — COMPREHENSIVE METABOLIC PANEL
ALT: 39 U/L (ref 0–44)
AST: 16 U/L (ref 15–41)
Albumin: 2.8 g/dL — ABNORMAL LOW (ref 3.5–5.0)
Alkaline Phosphatase: 64 U/L (ref 38–126)
Anion gap: 11 (ref 5–15)
BUN: 27 mg/dL — ABNORMAL HIGH (ref 8–23)
CO2: 24 mmol/L (ref 22–32)
Calcium: 8.8 mg/dL — ABNORMAL LOW (ref 8.9–10.3)
Chloride: 101 mmol/L (ref 98–111)
Creatinine, Ser: 0.98 mg/dL (ref 0.61–1.24)
GFR, Estimated: >60 mL/min (ref >60)
Glucose, Bld: 267 mg/dL — ABNORMAL HIGH (ref 70–99)
Potassium: 5.2 mmol/L — ABNORMAL HIGH (ref 3.5–5.1)
Sodium: 136 mmol/L (ref 135–145)
Total Bilirubin: 1.3 mg/dL — ABNORMAL HIGH (ref 0.3–1.2)
Total Protein: 6.1 g/dL — ABNORMAL LOW (ref 6.5–8.1)

## 2020-06-26 LAB — GLUCOSE, CAPILLARY
Glucose-Capillary: 242 mg/dL — ABNORMAL HIGH (ref 70–99)
Glucose-Capillary: 332 mg/dL — ABNORMAL HIGH (ref 70–99)
Glucose-Capillary: 298 mg/dL — ABNORMAL HIGH (ref 70–99)
Glucose-Capillary: 245 mg/dL — ABNORMAL HIGH (ref 70–99)

## 2020-06-26 LAB — D-DIMER, QUANTITATIVE: D-Dimer, Quant: 1.96 ug/mL-FEU — ABNORMAL HIGH (ref 0.00–0.50)

## 2020-06-26 LAB — MAGNESIUM: Magnesium: 2.6 mg/dL — ABNORMAL HIGH (ref 1.7–2.4)

## 2020-06-26 LAB — FERRITIN: Ferritin: 1040 ng/mL — ABNORMAL HIGH (ref 24–336)

## 2020-06-26 LAB — C-REACTIVE PROTEIN: CRP: 0.5 mg/dL (ref <1.0)

## 2020-06-26 MED ORDER — INSULIN ASPART 100 UNIT/ML ~~LOC~~ SOLN
3.0000 [IU] | Freq: Three times a day (TID) | SUBCUTANEOUS | Status: DC
  Administered 2020-06-26 – 2020-06-27 (×4): 3 [IU] via SUBCUTANEOUS

## 2020-06-26 NOTE — Progress Notes (Signed)
PROGRESS NOTE    Bradley French  UUV:[id number] DOB: 12/02/1940 DOA: 06-25-20 PCP: [facility]    Brief Narrative:  80 y.o.malewith medical history significant ofCAD s/p CABG in 2006, HTN, IIDM, OSA on CPAP,COPD, presented with cough and increasing short of breath. Patient started to have symptoms of dry cough and shortness of breath 6 days ago, and he went to possible COVID-19 5 days ago. He also had subjective fever and chills, loss of appetite but no abdominal pain or diarrhea. He has not been eating or drinking much for the last 3 to 4 days. He was vaccinated for COVID x 2. Went to see his PCP who checked his pulse ox in the office and found low and told him to come to ED. COVID-positive with chest x-ray showed bilateral multifocal infiltrates.88% on room air, stabilized on 3L 95%.  Assessment & Plan:   Active Problems:   COVID-19 virus infection   COVID-19  Acute hypoxic respiratory failure secondary toCOVID PNA COPD exacerbation - completed remdes 06/19/20 - continue steroids and baricitinib (End 1/28) - Remains on high flow, needing between 7-9L - Strongly recommend ambulation, proning; work w/ PT -1/25: continue solumedrol, lovenox, combivent; mobilize, mobilize, mobilize, use IS; CXR w/ worsening infiltrates  DM2 - glucoses up d/t steroids - continue SSI, glucose checks, DM diet -1/25: increased SSI, lantus to 12 units -Have added novolog 3 units with meals per Diabetic Coordinator recs  Severe dehydration without creatinine elevation, POA - increase PO intake as tolerated - completed fluid recussitation  Deconditioning, ambulatory dysfunction, POA Failure to thrive - continue dietary supplement, marinol - PT: SNF vs HHPT w/ 24-hr supervision, still desats with ambulation/mobility -Dispo pending  CAD status post CABG - ASA, metoprolol  HTN - metoprolol, amlodipine,  losartan  DVT prophylaxis: Lovenox subq Code Status: Full Family Communication: Pt in room, family not at bedside  Status is: Inpatient  Remains inpatient appropriate because:Unsafe d/c plan and Inpatient level of care appropriate due to severity of illness   Dispo: The patient is from: Home              Anticipated d/c is to: SNF              Anticipated d/c date is: > 3 days              Patient currently is not medically stable to d/c.   Difficult to place patient No    Consultants:     Procedures:     Antimicrobials: Anti-infectives (From admission, onward)   Start     Dose/Rate Route Frequency Ordered Stop   06/19/20 1100  remdesivir 100 mg in sodium chloride 0.9 % 100 mL IVPB        100 mg 200 mL/hr over 30 Minutes Intravenous  Once 06/19/20 1003 06/20/20 0859   06/15/20 1000  remdesivir 100 mg in sodium chloride 0.9 % 100 mL IVPB       "Followed by" Linked Group Details   100 mg 200 mL/hr over 30 Minutes Intravenous Daily Jun 25, 2020 1912 06/19/20 0959   06/25/2020 2100  remdesivir 200 mg in sodium chloride 0.9% 250 mL IVPB       "Followed by" Linked Group Details   200 mg 580 mL/hr over 30 Minutes Intravenous Once 06/25/2020 1912 Jun 25, 2020 2242       Subjective: Without complaints today  Objective: Vitals:   06/25/20 0800 06/25/20 0823 06/25/20 2237 06/26/20 0528  BP:  (!) 127/98 117/60 100/60  Pulse:  80 70 66  Resp: 20 18 20 20   Temp:  98.1 F (36.7 C) (!) 97.5 F (36.4 C) 98.2 F (36.8 C)  TempSrc:      SpO2: 90%  91% 94%  Weight:      Height:        Intake/Output Summary (Last 24 hours) at 06/26/2020 1728 Last data filed at 06/26/2020 0846 Gross per 24 hour  Intake --  Output 500 ml  Net -500 ml   Filed Weights   06/16/2020 1039  Weight: 108.9 kg    Examination:  General exam: Appears calm and comfortable  Respiratory system: No audible wheezing. Respiratory effort normal. Cardiovascular system: perfused, no notable JVD Gastrointestinal  system: Abdomen is nondistended, nontender Central nervous system: Alert and oriented. No focal neurological deficits. Extremities: Symmetric 5 x 5 power. Skin: No rashes, lesions  Psychiatry: Judgement and insight appear normal. Mood & affect appropriate.   Data Reviewed: I have personally reviewed following labs and imaging studies  CBC: Recent Labs  Lab 06/22/20 0211 06/23/20 0255 06/24/20 0419 06/25/20 0331 06/26/20 0304  WBC 11.4* 9.1 12.4* 11.7* 10.9*  NEUTROABS  --   --   --   --  9.6*  HGB 16.1 15.8 16.0 17.1* 16.4  HCT 47.2 46.3 47.3 48.6 47.0  MCV 88.1 88.9 89.1 87.7 88.0  PLT 390 368 391 414* 885   Basic Metabolic Panel: Recent Labs  Lab 06/22/20 0211 06/23/20 0255 06/24/20 0419 06/25/20 0331 06/26/20 0304  NA 138 137 136 137 136  K 4.6 5.1 5.4* 5.3* 5.2*  CL 102 102 102 101 101  CO2 25 26 26 26 24   GLUCOSE 218* 365* 322* 229* 267*  BUN 18 22 23 22  27*  CREATININE 0.84 0.88 0.96 0.82 0.98  CALCIUM 8.7* 9.1 9.0 9.3 8.8*  MG  --   --   --  2.5* 2.6*   GFR: Estimated Creatinine Clearance: 84 mL/min (by C-G formula based on SCr of 0.98 mg/dL). Liver Function Tests: Recent Labs  Lab 06/26/20 0304  AST 16  ALT 39  ALKPHOS 64  BILITOT 1.3*  PROT 6.1*  ALBUMIN 2.8*   No results for input(s): LIPASE, AMYLASE in the last 168 hours. No results for input(s): AMMONIA in the last 168 hours. Coagulation Profile: No results for input(s): INR, PROTIME in the last 168 hours. Cardiac Enzymes: No results for input(s): CKTOTAL, CKMB, CKMBINDEX, TROPONINI in the last 168 hours. BNP (last 3 results) No results for input(s): PROBNP in the last 8760 hours. HbA1C: No results for input(s): HGBA1C in the last 72 hours. CBG: Recent Labs  Lab 06/25/20 1627 06/25/20 2238 06/26/20 0833 06/26/20 1219 06/26/20 1650  GLUCAP 372* 268* 242* 332* 298*   Lipid Profile: No results for input(s): CHOL, HDL, LDLCALC, TRIG, CHOLHDL, LDLDIRECT in the last 72 hours. Thyroid  Function Tests: No results for input(s): TSH, T4TOTAL, FREET4, T3FREE, THYROIDAB in the last 72 hours. Anemia Panel: Recent Labs    06/26/20 0304  FERRITIN 1,040*   Sepsis Labs: No results for input(s): PROCALCITON, LATICACIDVEN in the last 168 hours.  No results found for this or any previous visit (from the past 240 hour(s)).   Radiology Studies: No results found.  Scheduled Meds: . amLODipine  10 mg Oral Q2000  . aspirin EC  81 mg Oral Q2000  . baricitinib  4 mg Oral Daily  . diclofenac Sodium  2 g Topical Q2000  . dronabinol  2.5 mg Oral BID AC  . enoxaparin (  LOVENOX) injection  40 mg Subcutaneous Q12H  . feeding supplement  237 mL Oral BID BM  . insulin aspart  0-15 Units Subcutaneous TID WC  . insulin aspart  0-5 Units Subcutaneous QHS  . insulin aspart  3 Units Subcutaneous TID WC  . insulin glargine  12 Units Subcutaneous QHS  . Ipratropium-Albuterol  1 puff Inhalation TID  . methylPREDNISolone (SOLU-MEDROL) injection  40 mg Intravenous Q12H  . nicotine  21 mg Transdermal Daily  . polyethylene glycol  17 g Oral BID   Continuous Infusions:   LOS: 12 days   Marylu Lund, MD [facility] Pager On Amion  If 7PM-7AM, please contact night-coverage 06/26/2020, 5:28 PM

## 2020-06-26 NOTE — Plan of Care (Signed)
Patient remains on 7-9L HF O2. DX with Covid on 06/10/20. Denies SOB or pain at this time. Safety precautions maintained.

## 2020-06-26 NOTE — TOC Progression Note (Signed)
Transition of Care [facility]) - Progression Note    Patient Details  Name: Bradley French MRN: [id number] Date of Birth: 1941-02-09  Transition of [facility] CM/SW Contact  Joanne Chars, LCSW Phone Number: 06/26/2020, 4:04 PM  Clinical Narrative:   CSW received phone call from [place] [facility] will not allow her to have any more [place] clients in the building at this time.  CSW received call from [facility]--no available beds currently.     Expected Discharge Plan: [place] Barriers to Discharge: Continued Medical Work up,SNF Pending bed offer  Expected Discharge Plan and Services Expected Discharge Plan: [place] Choice: [place] arrangements for the past 2 months: Single Family Home                                       Social Determinants of Health (SDOH) Interventions    Readmission Risk Interventions No flowsheet data found.

## 2020-06-27 DIAGNOSIS — R0902 Hypoxemia: Secondary | ICD-10-CM

## 2020-06-27 DIAGNOSIS — U071 COVID-19: Secondary | ICD-10-CM | POA: Diagnosis not present

## 2020-06-27 LAB — GLUCOSE, CAPILLARY
Glucose-Capillary: 209 mg/dL — ABNORMAL HIGH (ref 70–99)
Glucose-Capillary: 327 mg/dL — ABNORMAL HIGH (ref 70–99)
Glucose-Capillary: 404 mg/dL — ABNORMAL HIGH (ref 70–99)
Glucose-Capillary: 353 mg/dL — ABNORMAL HIGH (ref 70–99)

## 2020-06-27 LAB — D-DIMER, QUANTITATIVE: D-Dimer, Quant: 1.93 ug/mL-FEU — ABNORMAL HIGH (ref 0.00–0.50)

## 2020-06-27 LAB — C-REACTIVE PROTEIN: CRP: 0.5 mg/dL (ref <1.0)

## 2020-06-27 MED ORDER — INSULIN ASPART 100 UNIT/ML ~~LOC~~ SOLN
5.0000 [IU] | Freq: Three times a day (TID) | SUBCUTANEOUS | Status: DC
  Administered 2020-06-27 – 2020-06-28 (×3): 5 [IU] via SUBCUTANEOUS

## 2020-06-27 MED ORDER — GLUCERNA SHAKE PO LIQD
237.0000 mL | Freq: Three times a day (TID) | ORAL | Status: DC
  Administered 2020-06-27 – 2020-07-03 (×15): 237 mL via ORAL
  Filled 2020-06-27 (×3): qty 237

## 2020-06-27 MED ORDER — INSULIN GLARGINE 100 UNIT/ML ~~LOC~~ SOLN
14.0000 [IU] | Freq: Every day | SUBCUTANEOUS | Status: DC
  Administered 2020-06-27: 14 [IU] via SUBCUTANEOUS
  Filled 2020-06-27 (×2): qty 0.14

## 2020-06-27 MED ORDER — ADULT MULTIVITAMIN W/MINERALS CH
1.0000 | ORAL_TABLET | Freq: Every day | ORAL | Status: DC
  Administered 2020-06-27 – 2020-07-10 (×13): 1 via ORAL
  Filled 2020-06-27 (×14): qty 1

## 2020-06-27 NOTE — Plan of Care (Signed)

## 2020-06-27 NOTE — Progress Notes (Signed)
PROGRESS NOTE    Bradley French  [id number] DOB: [birth year] DOA: 06/30/2020 PCP: [facility]    Brief Narrative:  80 y.o.malewith medical history significant ofCAD s/p CABG in 2006, HTN, IIDM, OSA on CPAP,COPD, presented with cough and increasing short of breath. Patient started to have symptoms of dry cough and shortness of breath 6 days ago, and he went to possible COVID-19 5 days ago. He also had subjective fever and chills, loss of appetite but no abdominal pain or diarrhea. He has not been eating or drinking much for the last 3 to 4 days. He was vaccinated for COVID x 2. Went to see his PCP who checked his pulse ox in the office and found low and told him to come to ED. COVID-positive with chest x-ray showed bilateral multifocal infiltrates.88% on room air, stabilized on 3L 95%.  Assessment & Plan:   Active Problems:   COVID-19 virus infection   COVID-19  Acute hypoxic respiratory failure secondary toCOVID PNA COPD exacerbation - completed remdes 06/19/20 - continue steroids and baricitinib (End 1/28) - Remains on high flow, continues to require around 7L - Now off isolation. Encourage pt to work w/ PT and ambulate -continue solumedrol, lovenox, combivent; -Cont to encourage ambulation  DM2s - continue SSI, glucose checks, DM diet -continue SSI -lantus increased to 12 units and novolog increased to 5 units per diabetic coordinator  Severe dehydration without creatinine elevation, POA - increase PO intake as tolerated - completed fluid recussitation  Deconditioning, ambulatory dysfunction, POA Failure to thrive - continue dietary supplement, marinol - PT: SNF vs HHPT w/ 24-hr supervision -Dispo pending.   CAD status post CABG - ASA, metoprolol  HTN - Continue with metoprolol, amlodipine, losartan  DVT prophylaxis: Lovenox subq Code Status: Full Family Communication: Pt in room,  family not at bedside  Status is: Inpatient  Remains inpatient appropriate because:Unsafe d/c plan and Inpatient level of care appropriate due to severity of illness   Dispo: The patient is from: Home              Anticipated d/c is to: SNF              Anticipated d/c date is: > 3 days              Patient currently is not medically stable to d/c.   Difficult to place patient No    Consultants:     Procedures:     Antimicrobials: Anti-infectives (From admission, onward)   Start     Dose/Rate Route Frequency Ordered Stop   06/19/20 1100  remdesivir 100 mg in sodium chloride 0.9 % 100 mL IVPB        100 mg 200 mL/hr over 30 Minutes Intravenous  Once 06/19/20 1003 06/20/20 0859   06/15/20 1000  remdesivir 100 mg in sodium chloride 0.9 % 100 mL IVPB       "Followed by" Linked Group Details   100 mg 200 mL/hr over 30 Minutes Intravenous Daily 06/09/2020 1912 06/19/20 0959   06/01/2020 2100  remdesivir 200 mg in sodium chloride 0.9% 250 mL IVPB       "Followed by" Linked Group Details   200 mg 580 mL/hr over 30 Minutes Intravenous Once 06/03/2020 1912 06/13/2020 2242      Subjective: Eager to go home soon. Reports feeling better, although pt remains on high flow O2  Objective: Vitals:   06/26/20 0528 06/26/20 1400 06/26/20 2211 06/27/20 0555  BP: 100/60 95/85 111/63 Marland Kitchen)  112/55  Pulse: 66  71 67  Resp: 20 16 17    Temp: 98.2 F (36.8 C) 98.5 F (36.9 C) 98.4 F (36.9 C) 98.2 F (36.8 C)  TempSrc:  Oral Oral   SpO2: 94%  97% 97%  Weight:      Height:        Intake/Output Summary (Last 24 hours) at 06/27/2020 1503 Last data filed at 06/27/2020 5643 Gross per 24 hour  Intake --  Output 400 ml  Net -400 ml   Filed Weights   06/05/2020 1039  Weight: 108.9 kg    Examination: General exam: Awake, laying in bed, in nad Respiratory system: Normal respiratory effort, no audible wheezing Cardiovascular system: perfused, no notable jvd Gastrointestinal system: Soft,  nondistended Central nervous system: CN2-12 grossly intact, strength intact Extremities: Perfused, no clubbing Skin: Normal skin turgor, no notable skin lesions seen Psychiatry: Mood normal // no visual hallucinations   Data Reviewed: I have personally reviewed following labs and imaging studies  CBC: Recent Labs  Lab 06/22/20 0211 06/23/20 0255 06/24/20 0419 06/25/20 0331 06/26/20 0304  WBC 11.4* 9.1 12.4* 11.7* 10.9*  NEUTROABS  --   --   --   --  9.6*  HGB 16.1 15.8 16.0 17.1* 16.4  HCT 47.2 46.3 47.3 48.6 47.0  MCV 88.1 88.9 89.1 87.7 88.0  PLT 390 368 391 414* 329   Basic Metabolic Panel: Recent Labs  Lab 06/22/20 0211 06/23/20 0255 06/24/20 0419 06/25/20 0331 06/26/20 0304  NA 138 137 136 137 136  K 4.6 5.1 5.4* 5.3* 5.2*  CL 102 102 102 101 101  CO2 25 26 26 26 24   GLUCOSE 218* 365* 322* 229* 267*  BUN 18 22 23 22  27*  CREATININE 0.84 0.88 0.96 0.82 0.98  CALCIUM 8.7* 9.1 9.0 9.3 8.8*  MG  --   --   --  2.5* 2.6*   GFR: Estimated Creatinine Clearance: 84 mL/min (by C-G formula based on SCr of 0.98 mg/dL). Liver Function Tests: Recent Labs  Lab 06/26/20 0304  AST 16  ALT 39  ALKPHOS 64  BILITOT 1.3*  PROT 6.1*  ALBUMIN 2.8*   No results for input(s): LIPASE, AMYLASE in the last 168 hours. No results for input(s): AMMONIA in the last 168 hours. Coagulation Profile: No results for input(s): INR, PROTIME in the last 168 hours. Cardiac Enzymes: No results for input(s): CKTOTAL, CKMB, CKMBINDEX, TROPONINI in the last 168 hours. BNP (last 3 results) No results for input(s): PROBNP in the last 8760 hours. HbA1C: No results for input(s): HGBA1C in the last 72 hours. CBG: Recent Labs  Lab 06/26/20 1219 06/26/20 1650 06/26/20 2007 06/27/20 0938 06/27/20 1154  GLUCAP 332* 298* 245* 209* 327*   Lipid Profile: No results for input(s): CHOL, HDL, LDLCALC, TRIG, CHOLHDL, LDLDIRECT in the last 72 hours. Thyroid Function Tests: No results for  input(s): TSH, T4TOTAL, FREET4, T3FREE, THYROIDAB in the last 72 hours. Anemia Panel: Recent Labs    06/26/20 0304  FERRITIN 1,040*   Sepsis Labs: No results for input(s): PROCALCITON, LATICACIDVEN in the last 168 hours.  No results found for this or any previous visit (from the past 240 hour(s)).   Radiology Studies: No results found.  Scheduled Meds: . amLODipine  10 mg Oral Q2000  . aspirin EC  81 mg Oral Q2000  . diclofenac Sodium  2 g Topical Q2000  . dronabinol  2.5 mg Oral BID AC  . enoxaparin (LOVENOX) injection  40 mg Subcutaneous Q12H  .  feeding supplement  237 mL Oral BID BM  . insulin aspart  0-15 Units Subcutaneous TID WC  . insulin aspart  0-5 Units Subcutaneous QHS  . insulin aspart  5 Units Subcutaneous TID WC  . insulin glargine  14 Units Subcutaneous QHS  . Ipratropium-Albuterol  1 puff Inhalation TID  . methylPREDNISolone (SOLU-MEDROL) injection  40 mg Intravenous Q12H  . multivitamin with minerals  1 tablet Oral Daily  . nicotine  21 mg Transdermal Daily  . polyethylene glycol  17 g Oral BID   Continuous Infusions:   LOS: 13 days   Marylu Lund, MD [facility] Pager On Amion  If 7PM-7AM, please contact night-coverage 06/27/2020, 3:03 PM

## 2020-06-27 NOTE — Progress Notes (Signed)
Inpatient Diabetes Program Recommendations  AACE/ADA: New Consensus Statement on Inpatient Glycemic Control (2015)  Target Ranges:  Prepandial:   less than 140 mg/dL      Peak postprandial:   less than 180 mg/dL (1-2 hours)      Critically ill patients:  140 - 180 mg/dL   Lab Results  Component Value Date   GLUCAP 327 (H) 06/27/2020   HGBA1C 5.9 (H) 04/12/2020    Review of Glycemic Control  Diabetes history: DM2 Outpatient Diabetes medications: metformin 500 mg QAM Current orders for Inpatient glycemic control: Lantus 12 units QHS, Novolog 0-15 units TID with meals and 0-5 HS + 3 units TID.  On Solumedrol 40 mg Q12H HgbA1C - 5.9%  Inpatient Diabetes Program Recommendations:     Increase Lantus to 14 units QHS Increase Novolog to 5 units TID with meals if eating > 50%.  Continue to follow.  Thank you. Lorenda Peck, RD, LDN, CDE Inpatient Diabetes Coordinator [phone]

## 2020-06-27 NOTE — Progress Notes (Signed)
Initial Nutrition Assessment  DOCUMENTATION CODES:   Not applicable  INTERVENTION:   Liberalize diet to REGULAR    Ensure Enlive po BID, each supplement provides 350 kcal and 20 grams of protein  Magic cup TID with meals, each supplement provides 290 kcal and 9 grams of protein  MVI daily   NUTRITION DIAGNOSIS:   Increased nutrient needs related to acute illness (COVID PNA) as evidenced by estimated needs.  GOAL:   Patient will meet greater than or equal to 90% of their needs  MONITOR:   PO intake,Supplement acceptance,Weight trends,Labs,I & O's  REASON FOR ASSESSMENT:   LOS    ASSESSMENT:   Patient with PMH significant for CAD s/p CABG, HTN, DM, and COPD. Presents this admission with COVID PNA.   Pt denies decreased appetite PTA. Typically skips breakfast and eat two meals throughout the day. Meals consist of meat, vegetable, grain. Appetite this admission slow to progress. Pt complaining that food is served cold and lacks taste.CBGs elevated most likely due to steroid use. Given advanced age and increased nutrition need with COVID, recommend liberalization of diet to regular. Pt taking Ensure BID and would like to continue.   Pt endorses a UBW of 240 lb and a recent wt loss of 20 lbs. Admission weight looks to be stated. Will need to obtain actual weight to assess for weight loss.   Medications: marinol BID, SS novolog, lantus, solumedrol, miralax Labs: K 5.2 (H) Mg 2.6 (H) CBG 209-327  Diet Order:   Diet Order            Diet heart healthy/carb modified Room service appropriate? Yes; Fluid consistency: Thin  Diet effective now                 EDUCATION NEEDS:   Education needs have been addressed  Skin:  Skin Assessment: Reviewed RN Assessment  Last BM:  1/26  Height:   Ht Readings from Last 1 Encounters:  06/22/2020 6\' 4"  (1.93 m)    Weight:   Wt Readings from Last 1 Encounters:  06/19/2020 108.9 kg    BMI:  Body mass index is 29.21  kg/m.  Estimated Nutritional Needs:   Kcal:  2500-2700 kcal  Protein:  125-145 grams  Fluid:  >/= 2 L/day  Mariana Single RD, LDN Clinical Nutrition Pager listed in [place]

## 2020-06-28 DIAGNOSIS — U071 COVID-19: Secondary | ICD-10-CM | POA: Diagnosis not present

## 2020-06-28 DIAGNOSIS — R0902 Hypoxemia: Secondary | ICD-10-CM | POA: Diagnosis not present

## 2020-06-28 LAB — GLUCOSE, CAPILLARY
Glucose-Capillary: 248 mg/dL — ABNORMAL HIGH (ref 70–99)
Glucose-Capillary: 179 mg/dL — ABNORMAL HIGH (ref 70–99)
Glucose-Capillary: 192 mg/dL — ABNORMAL HIGH (ref 70–99)
Glucose-Capillary: 368 mg/dL — ABNORMAL HIGH (ref 70–99)
Glucose-Capillary: 370 mg/dL — ABNORMAL HIGH (ref 70–99)

## 2020-06-28 MED ORDER — INSULIN GLARGINE 100 UNIT/ML ~~LOC~~ SOLN
18.0000 [IU] | Freq: Every day | SUBCUTANEOUS | Status: DC
  Administered 2020-06-28 – 2020-06-29 (×2): 18 [IU] via SUBCUTANEOUS
  Filled 2020-06-28 (×3): qty 0.18

## 2020-06-28 MED ORDER — ALBUTEROL SULFATE (2.5 MG/3ML) 0.083% IN NEBU
2.5000 mg | INHALATION_SOLUTION | RESPIRATORY_TRACT | Status: DC | PRN
  Administered 2020-06-29: 2.5 mg via RESPIRATORY_TRACT
  Filled 2020-06-28: qty 3

## 2020-06-28 MED ORDER — INSULIN ASPART 100 UNIT/ML ~~LOC~~ SOLN
10.0000 [IU] | Freq: Three times a day (TID) | SUBCUTANEOUS | Status: DC
  Administered 2020-06-28 – 2020-07-01 (×7): 10 [IU] via SUBCUTANEOUS

## 2020-06-28 NOTE — Progress Notes (Signed)
PROGRESS NOTE    Bradley French  [id number] DOB: June 03, 1940 DOA: 06/13/2020 PCP: [facility]    Brief Narrative:  80 y.o.malewith medical history significant ofCAD s/p CABG in 2006, HTN, IIDM, OSA on CPAP,COPD, presented with cough and increasing short of breath. Patient started to have symptoms of dry cough and shortness of breath 6 days ago, and he went to possible COVID-19 5 days ago. He also had subjective fever and chills, loss of appetite but no abdominal pain or diarrhea. He has not been eating or drinking much for the last 3 to 4 days. He was vaccinated for COVID x 2. Went to see his PCP who checked his pulse ox in the office and found low and told him to come to ED. COVID-positive with chest x-ray showed bilateral multifocal infiltrates.88% on room air, stabilized on 3L 95%.  Assessment & Plan:   Active Problems:   COVID-19 virus infection   COVID-19  Acute hypoxic respiratory failure secondary toCOVID PNA COPD exacerbation - completed remdes 06/19/20 - continued on steroids and baricitinib (End 1/28) - Remains on high flow, continues to require around 7L - Now off isolation. Encourage pt to work w/ PT and ambulate -continue solumedrol, lovenox, combivent; -Cont to encourage ambulation  DM2s - continue SSI, glucose checks, DM diet -continue SSI -lantus increased to 18 units and novolog increased to 10 units per diabetic coordinator  Severe dehydration without creatinine elevation, POA - increase PO intake as tolerated - completed fluid recussitation -Stable at this time  Deconditioning, ambulatory dysfunction, POA Failure to thrive - continue dietary supplement, marinol - PT: SNF vs HHPT w/ 24-hr supervision -Pending dispo. TOC following   CAD status post CABG - ASA, metoprolol  HTN - Continue with metoprolol, amlodipine, losartan  DVT prophylaxis: Lovenox subq Code Status:  Full Family Communication: Pt in room, family not at bedside  Status is: Inpatient  Remains inpatient appropriate because:Unsafe d/c plan and Inpatient level of care appropriate due to severity of illness   Dispo: The patient is from: Home              Anticipated d/c is to: SNF              Anticipated d/c date is: > 3 days              Patient currently is not medically stable to d/c.   Difficult to place patient No    Consultants:     Procedures:     Antimicrobials: Anti-infectives (From admission, onward)   Start     Dose/Rate Route Frequency Ordered Stop   06/19/20 1100  remdesivir 100 mg in sodium chloride 0.9 % 100 mL IVPB        100 mg 200 mL/hr over 30 Minutes Intravenous  Once 06/19/20 1003 06/20/20 0859   06/15/20 1000  remdesivir 100 mg in sodium chloride 0.9 % 100 mL IVPB       "Followed by" Linked Group Details   100 mg 200 mL/hr over 30 Minutes Intravenous Daily 06/16/2020 1912 06/19/20 0959   06/30/2020 2100  remdesivir 200 mg in sodium chloride 0.9% 250 mL IVPB       "Followed by" Linked Group Details   200 mg 580 mL/hr over 30 Minutes Intravenous Once 06/20/2020 1912 06/28/2020 2242      Subjective: Reports feeling somewhat better today  Objective: Vitals:   06/27/20 0555 06/27/20 1621 06/27/20 2142 06/28/20 0508  BP: (!) 112/55 118/67 136/65 (!) 126/58  Pulse: 67 86 93 62  Resp:  20 19 17   Temp: 98.2 F (36.8 C) 98 F (36.7 C) 97.6 F (36.4 C) 97.9 F (36.6 C)  TempSrc:  Oral Oral   SpO2: 97% 92% 92% 94%  Weight:      Height:        Intake/Output Summary (Last 24 hours) at 06/28/2020 1541 Last data filed at 06/28/2020 1200 Gross per 24 hour  Intake 240 ml  Output 1500 ml  Net -1260 ml   Filed Weights   2020/06/23 1039  Weight: 108.9 kg    Examination: General exam: Conversant, in no acute distress Respiratory system: normal chest rise, clear, no audible wheezing Cardiovascular system: regular rhythm, s1-s2 Gastrointestinal system:  Nondistended, nontender, pos BS Central nervous system: No seizures, no tremors Extremities: No cyanosis, no joint deformities Skin: No rashes, no pallor Psychiatry: Affect normal // no auditory hallucinations   Data Reviewed: I have personally reviewed following labs and imaging studies  CBC: Recent Labs  Lab 06/22/20 0211 06/23/20 0255 06/24/20 0419 06/25/20 0331 06/26/20 0304  WBC 11.4* 9.1 12.4* 11.7* 10.9*  NEUTROABS  --   --   --   --  9.6*  HGB 16.1 15.8 16.0 17.1* 16.4  HCT 47.2 46.3 47.3 48.6 47.0  MCV 88.1 88.9 89.1 87.7 88.0  PLT 390 368 391 414* 299   Basic Metabolic Panel: Recent Labs  Lab 06/22/20 0211 06/23/20 0255 06/24/20 0419 06/25/20 0331 06/26/20 0304  NA 138 137 136 137 136  K 4.6 5.1 5.4* 5.3* 5.2*  CL 102 102 102 101 101  CO2 25 26 26 26 24   GLUCOSE 218* 365* 322* 229* 267*  BUN 18 22 23 22  27*  CREATININE 0.84 0.88 0.96 0.82 0.98  CALCIUM 8.7* 9.1 9.0 9.3 8.8*  MG  --   --   --  2.5* 2.6*   GFR: Estimated Creatinine Clearance: 84 mL/min (by C-G formula based on SCr of 0.98 mg/dL). Liver Function Tests: Recent Labs  Lab 06/26/20 0304  AST 16  ALT 39  ALKPHOS 64  BILITOT 1.3*  PROT 6.1*  ALBUMIN 2.8*   No results for input(s): LIPASE, AMYLASE in the last 168 hours. No results for input(s): AMMONIA in the last 168 hours. Coagulation Profile: No results for input(s): INR, PROTIME in the last 168 hours. Cardiac Enzymes: No results for input(s): CKTOTAL, CKMB, CKMBINDEX, TROPONINI in the last 168 hours. BNP (last 3 results) No results for input(s): PROBNP in the last 8760 hours. HbA1C: No results for input(s): HGBA1C in the last 72 hours. CBG: Recent Labs  Lab 06/27/20 2144 06/28/20 0743 06/28/20 1152 06/28/20 1156 06/28/20 1533  GLUCAP 353* 248* 192* 179* 368*   Lipid Profile: No results for input(s): CHOL, HDL, LDLCALC, TRIG, CHOLHDL, LDLDIRECT in the last 72 hours. Thyroid Function Tests: No results for input(s): TSH,  T4TOTAL, FREET4, T3FREE, THYROIDAB in the last 72 hours. Anemia Panel: Recent Labs    06/26/20 0304  FERRITIN 1,040*   Sepsis Labs: No results for input(s): PROCALCITON, LATICACIDVEN in the last 168 hours.  No results found for this or any previous visit (from the past 240 hour(s)).   Radiology Studies: No results found.  Scheduled Meds: . amLODipine  10 mg Oral Q2000  . aspirin EC  81 mg Oral Q2000  . diclofenac Sodium  2 g Topical Q2000  . dronabinol  2.5 mg Oral BID AC  . enoxaparin (LOVENOX) injection  40 mg Subcutaneous Q12H  . feeding  supplement (GLUCERNA SHAKE)  237 mL Oral TID BM  . insulin aspart  0-15 Units Subcutaneous TID WC  . insulin aspart  0-5 Units Subcutaneous QHS  . insulin aspart  5 Units Subcutaneous TID WC  . insulin glargine  14 Units Subcutaneous QHS  . Ipratropium-Albuterol  1 puff Inhalation TID  . methylPREDNISolone (SOLU-MEDROL) injection  40 mg Intravenous Q12H  . multivitamin with minerals  1 tablet Oral Daily  . nicotine  21 mg Transdermal Daily  . polyethylene glycol  17 g Oral BID   Continuous Infusions:   LOS: 14 days   Marylu Lund, MD [facility] Pager On Amion  If 7PM-7AM, please contact night-coverage 06/28/2020, 3:41 PM

## 2020-06-28 NOTE — Evaluation (Signed)
Occupational Therapy Evaluation Patient Details Name: Bradley French MRN: [id number] DOB: June 22, 1940 Today's Date: 06/28/2020    History of Present Illness 80 yo male presenting with cough and shortness of breath. Tested COVID-19 positive on 1/9. Vaccinated x2. PMH including CAD s/p CABG in 2006, HTN, IIDM, OSA on CPAP, and COPD.   Clinical Impression   PTA, pt was living with his wife and was independent with use of SPC for mobility. Pt currently requiring Min A for ADLs and functional mobility. Pt presenting with poor activity tolerance as seen by SOB, fatigue, and drops in SpO2. During transfers to/from [facility], SpO2 dropping to 84-80% on 10L via HFNC. Pt requiring seated rest break with cues for purse lip breathing to recover to 90s. Pt would benefit from further acute OT to facilitate safe dc. Recommend dc to SNF for further OT to optimize safety, independence with ADLs, and return to PLOF.   Upon arrival, SpO2 90% on 7L via HFNC. During movement, SpO2 dropping to 80-84% on 10L HFNC. Recovering back to 94% on 10L and returned to 7L at end of session.     Follow Up Recommendations  SNF    Equipment Recommendations  3 in 1 bedside commode;Tub/shower seat    Recommendations for Other Services PT consult     Precautions / Restrictions Precautions Precautions: Fall Precaution Comments: monitor sats Restrictions Weight Bearing Restrictions: No      Mobility Bed Mobility Overal bed mobility: Needs Assistance Bed Mobility: Supine to Sit     Supine to sit: Supervision;HOB elevated     General bed mobility comments: Supervision for safety and increased time and effort    Transfers Overall transfer level: Needs assistance Equipment used: Rolling walker (2 wheeled) Transfers: Sit to/from Stand Sit to Stand: Min guard;Min assist         General transfer comment: Min Guard A for safety and occasional Min A for gaining balance    Balance Overall balance assessment: Needs  assistance Sitting-balance support: No upper extremity supported;Feet supported Sitting balance-Leahy Scale: Good     Standing balance support: During functional activity;Bilateral upper extremity supported Standing balance-Leahy Scale: Poor                             ADL either performed or assessed with clinical judgement   ADL Overall ADL's : Needs assistance/impaired Eating/Feeding: Set up;Sitting   Grooming: Minimal assistance;Sitting   Upper Body Bathing: Minimal assistance;Sitting   Lower Body Bathing: Minimal assistance;Sit to/from stand   Upper Body Dressing : Minimal assistance;Sitting   Lower Body Dressing: Minimal assistance;Sit to/from stand Lower Body Dressing Details (indicate cue type and reason): Min A for balance in standing. Pt able to adjust socks while sitting at EOB Toilet Transfer: Minimal assistance;Stand-pivot;Ambulation;BSC;RW Toilet Transfer Details (indicate cue type and reason): Min A for balance and safety. Pt fatigues quickly and presents with SOB. SpO2 dropping to 80% on 10L via HFNC Toileting- Clothing Manipulation and Hygiene: Minimal assistance;Sit to/from stand Toileting - Clothing Manipulation Details (indicate cue type and reason): In standing pt performing posterior peri care with Min A for standing balance and safety     Functional mobility during ADLs: Minimal assistance;Rolling walker General ADL Comments: Pt presenting with poor activity tolerance, strength, cognition, and balance     Vision Baseline Vision/History: Wears glasses Wears Glasses: At all times Patient Visual Report: No change from baseline       Perception     Praxis  Pertinent Vitals/Pain Pain Assessment: Faces Faces Pain Scale: No hurt Pain Intervention(s): Monitored during session;Repositioned     Hand Dominance Right   Extremity/Trunk Assessment Upper Extremity Assessment Upper Extremity Assessment: Overall WFL for tasks assessed    Lower Extremity Assessment Lower Extremity Assessment: Generalized weakness   Cervical / Trunk Assessment Cervical / Trunk Assessment: Normal   Communication Communication Communication: No difficulties   Cognition Arousal/Alertness: Awake/alert Behavior During Therapy: Flat affect Overall Cognitive Status: Impaired/Different from baseline Area of Impairment: Attention;Memory;Following commands;Safety/judgement;Awareness;Problem solving                   Current Attention Level: Sustained Memory: Decreased short-term memory Following Commands: Follows one step commands inconsistently;Follows one step commands with increased time Safety/Judgement: Decreased awareness of safety;Decreased awareness of deficits Awareness: Intellectual Problem Solving: Slow processing;Requires verbal cues General Comments: Pt not saying much and presenting with flat affect. Smiling once when discussing his time with the [employer]. Pt perseverating on topic of calling his wife; pt picking up room phone at random times, turnign it on, and then just holding it to his ear. When asked what happened, pt stating "it says your call can't be completed as dialed. it keeps doing that." However, pt never actually dialed a number. Pt requiring increased time and cues throughout   General Comments  Upon arrival, SpO2 90% on 7L via HFNC. During movement, SpO2 dropping to 80-84% on 10L HFNC. Recovering back to 94% on 10L and returned to 7L at end of session    Exercises     Shoulder Instructions      Home Living Family/patient expects to be discharged to:: Private residence Living Arrangements: Spouse/significant other;Other relatives (Son) Available Help at Discharge: Family;Available 24 hours/day Type of Home: House Home Access: Stairs to enter [employer] of Steps: 3 or 5 Entrance Stairs-Rails: Can reach both;Left;Right Home Layout: One level     Bathroom Shower/Tub: Contractor: Handicapped height     Home Equipment: Cane - single point          Prior Functioning/Environment Level of Independence: Independent with assistive device(s)        Comments: ADLs, IADLs, driving        OT Problem List: Decreased strength;Decreased range of motion;Decreased activity tolerance;Impaired balance (sitting and/or standing);Decreased cognition;Decreased safety awareness;Decreased knowledge of use of DME or AE;Decreased knowledge of precautions;Cardiopulmonary status limiting activity      OT Treatment/Interventions: Self-care/ADL training;Therapeutic exercise;Energy conservation;DME and/or AE instruction;Therapeutic activities;Patient/family education    OT Goals(Current goals can be found in the care plan section) Acute Rehab OT Goals Patient Stated Goal: Agreeable to OOB OT Goal Formulation: With patient Time For Goal Achievement: 08-09-20 Potential to Achieve Goals: Good  OT Frequency: Min 2X/week   Barriers to D/C:            Co-evaluation              AM-PAC OT "6 Clicks" Daily Activity     Outcome Measure Help from another person eating meals?: A Little Help from another person taking care of personal grooming?: A Little Help from another person toileting, which includes using toliet, bedpan, or urinal?: A Little Help from another person bathing (including washing, rinsing, drying)?: A Little Help from another person to put on and taking off regular upper body clothing?: A Little Help from another person to put on and taking off regular lower body clothing?: A Little 6 Click Score: 18   End of Session  Equipment Utilized During Treatment: Rolling walker;Oxygen Nurse Communication: Mobility status;Other (comment) (Decreased cognition)  Activity Tolerance: Patient tolerated treatment well Patient left: in chair;with call bell/phone within reach;with chair alarm set  OT Visit Diagnosis: Unsteadiness on feet (R26.81);Other abnormalities of  gait and mobility (R26.89);Muscle weakness (generalized) (M62.81)                Time: 2694-8546 OT Time Calculation (min): 31 min Charges:  OT General Charges $OT Visit: 1 Visit OT Evaluation $OT Eval Moderate Complexity: 1 Mod OT Treatments $Self Care/Home Management : 8-22 mins  Haru Anspaugh MSOT, OTR/L Acute Rehab Pager: [phone] Office: [place] 06/28/2020, 1:38 PM

## 2020-06-28 NOTE — Progress Notes (Signed)
Physical Therapy Treatment Patient Details Name: Bradley French MRN: [id number] DOB: [birth year] Today's Date: 06/28/2020    History of Present Illness 80 yo male presenting with cough and shortness of breath. Tested COVID-19 positive on 1/9. Vaccinated x2. PMH including CAD s/p CABG in 2006, HTN, IIDM, OSA on CPAP, and COPD.    PT Comments    Pt appears better cognitively, however, remains very dyspnic with any mobility, Sats 92% on 8L O2 [place] O2 92% and HR 106 bpm.  Pt DOE 3/4 even with just sitting EOB.  Did not want to preform second bout of gait or exercises/OOB activity.  Despite this, he is progressing from my last session with him. Goals updated.    Follow Up Recommendations  SNF     Equipment Recommendations  Other (comment) (O2)    Recommendations for Other Services       Precautions / Restrictions Precautions Precautions: Fall Precaution Comments: monitor sats    Mobility  Bed Mobility Overal bed mobility: Modified Independent Bed Mobility: Supine to Sit     Supine to sit: Supervision;HOB elevated     General bed mobility comments: Pt moves swiftly to the EOB.  Transfers Overall transfer level: Needs assistance Equipment used: Rolling walker (2 wheeled) Transfers: Sit to/from Stand Sit to Stand: Min guard         General transfer comment: Min guard assist to come to standing from elevated bed x 3.  Ambulation/Gait Ambulation/Gait assistance: Min guard Gait Distance (Feet): 15 Feet Assistive device: Rolling walker (2 wheeled) Gait Pattern/deviations: Step-through pattern;Staggering right;Staggering left     General Gait Details: Pt with staggering gait pattern.  Cues to keep RW on the ground as he turned.  Increased his O2 during gait due to significant increased in DOE upon sitting EOB. O2 92% HR 106.  Not agreeable to second round and not agreeable to OOB to chair.  He did at least walk today which is more than my last session with him.   Stairs              Wheelchair Mobility    Modified Rankin (Stroke Patients Only)       Balance Overall balance assessment: Needs assistance Sitting-balance support: Feet supported;Bilateral upper extremity supported Sitting balance-Leahy Scale: Fair     Standing balance support: Bilateral upper extremity supported Standing balance-Leahy Scale: Poor Standing balance comment: relaint on RW and PT                            Cognition Arousal/Alertness: Awake/alert Behavior During Therapy: Impulsive Overall Cognitive Status: No family/caregiver present to determine baseline cognitive functioning Area of Impairment: Attention;Memory;Following commands;Safety/judgement;Awareness;Problem solving                   Current Attention Level: Sustained Memory: Decreased short-term memory Following Commands: Follows one step commands inconsistently;Follows one step commands with increased time Safety/Judgement: Decreased awareness of safety;Decreased awareness of deficits Awareness: Intellectual Problem Solving: Slow processing;Requires verbal cues General Comments: Pt much brighter, more alert, seems to be cognitively better than the last time I saw him.  Still fairly self limiting.      Exercises Other Exercises Other Exercises: Pt reports he knows he is supposed to be doing his IS and Flutter valve every hour, but reports non-compliance when I asked if he had.    General Comments General comments (skin integrity, edema, etc.): Upon arrival, SpO2 90% on 7L via HFNC. During movement,  SpO2 dropping to 80-84% on 10L HFNC. Recovering back to 94% on 10L and returned to 7L at end of session      Pertinent Vitals/Pain Pain Assessment: No/denies pain Pain Score: 0-No pain Faces Pain Scale: No hurt Pain Intervention(s): Monitored during session;Repositioned    Home Living Family/patient expects to be discharged to:: Private residence Living Arrangements:  Spouse/significant other;Other relatives (Son) Available Help at Discharge: Family;Available 24 hours/day Type of Home: House Home Access: Stairs to enter Entrance Stairs-Rails: Can reach both;Left;Right Home Layout: One level Home Equipment: Cane - single point      Prior Function Level of Independence: Independent with assistive device(s)      Comments: ADLs, IADLs, driving   PT Goals (current goals can now be found in the care plan section) Acute Rehab PT Goals Patient Stated Goal: none indicated, agreeable to walk, but not get up. PT Goal Formulation: With patient Time For Goal Achievement: 08-04-20 Potential to Achieve Goals: Good Progress towards PT goals: Progressing toward goals    Frequency    Min 2X/week      PT Plan Current plan remains appropriate    Co-evaluation              AM-PAC PT "6 Clicks" Mobility   Outcome Measure  Help needed turning from your back to your side while in a flat bed without using bedrails?: A Little Help needed moving from lying on your back to sitting on the side of a flat bed without using bedrails?: A Little Help needed moving to and from a bed to a chair (including a wheelchair)?: A Little Help needed standing up from a chair using your arms (e.g., wheelchair or bedside chair)?: A Little Help needed to walk in hospital room?: A Little Help needed climbing 3-5 steps with a railing? : A Lot 6 Click Score: 17    End of Session Equipment Utilized During Treatment: Oxygen Activity Tolerance: Patient tolerated treatment well Patient left: in bed;with call bell/phone within reach;with bed alarm set   PT Visit Diagnosis: Unsteadiness on feet (R26.81);Muscle weakness (generalized) (M62.81);Other abnormalities of gait and mobility (R26.89)     Time: 5701-7793 PT Time Calculation (min) (ACUTE ONLY): 12 min  Charges:  $Gait Training: 8-22 mins                     Verdene Lennert, PT, DPT  Acute Rehabilitation [phone]  pager [phone]) [phone] office

## 2020-06-28 NOTE — Plan of Care (Signed)

## 2020-06-28 NOTE — Progress Notes (Signed)
Inpatient Diabetes Program Recommendations  AACE/ADA: New Consensus Statement on Inpatient Glycemic Control (2015)  Target Ranges:  Prepandial:   less than 140 mg/dL      Peak postprandial:   less than 180 mg/dL (1-2 hours)      Critically ill patients:  140 - 180 mg/dL   Lab Results  Component Value Date   GLUCAP 248 (H) 06/28/2020   HGBA1C 5.9 (H) 04/12/2020    Review of Glycemic Control Results for SRIMAN, TALLY (MRN [id number]) as of 06/28/2020 10:39  Ref. Range 06/27/2020 09:38 06/27/2020 11:54 06/27/2020 16:10 06/27/2020 21:44 06/28/2020 07:43  Glucose-Capillary Latest Ref Range: 70 - 99 mg/dL 209 (H)  Novolog 8 units  Solumedrol 40 327 (H)  Novolog 14 units 404 (H)  Novolog 20 units 353 (H)  Novolog 4 units  Lantus 14 units  Solumedrol 40 mg 248 (H)  Novolog 10 units Solumedrol 40 mg    Diabetes history: DM2 Outpatient Diabetes medications: metformin 500 mg QAM Current orders for Inpatient glycemic control:  Lantus 14 units Novolog 0-15 units tid + hs Novolog 5 units tid meal coverage  Solumedrol 40 mg Q12 hours Glucerna tid between meals  Inpatient Diabetes Program Recommendations:    If steroids remain at current dose consider: - Increase Lantus to 18 units - Increase Novolog meal coverage to 10 units  Thanks,  Tama Headings RN, MSN, BC-ADM Inpatient Diabetes Coordinator Team Pager [phone] (8a-5p)

## 2020-06-28 NOTE — TOC Progression Note (Signed)
Transition of Care [facility]) - Progression Note    Patient Details  Name: Bradley French MRN: [id number] Date of Birth: 1940-09-20  Transition of [facility]) CM/SW Contact  Joanne Chars, LCSW Phone Number: 06/28/2020, 8:50 AM  Clinical Narrative:  CSW spoke with pt wife, updated her that SNFs in [place] unable to make bed offers.  She did visit pt yesterday and pt got up and moved around, she is planning to come back today.  She asked for update from MD. MD notified.     Expected Discharge Plan: [place] Barriers to Discharge: Continued Medical Work up,SNF Pending bed offer  Expected Discharge Plan and Services Expected Discharge Plan: [place] Choice: [place] arrangements for the past 2 months: Single Family Home                                       Social Determinants of Health (SDOH) Interventions    Readmission Risk Interventions No flowsheet data found.

## 2020-06-28 NOTE — Plan of Care (Signed)
Safety precautions maintained. Using ISP. O2 decreased 4Ln/c. Sats 94%

## 2020-06-29 DIAGNOSIS — R0902 Hypoxemia: Secondary | ICD-10-CM | POA: Diagnosis not present

## 2020-06-29 DIAGNOSIS — U071 COVID-19: Secondary | ICD-10-CM | POA: Diagnosis not present

## 2020-06-29 LAB — CBC
HCT: 48.3 % (ref 39.0–52.0)
Hemoglobin: 16.5 g/dL (ref 13.0–17.0)
MCH: 30.1 pg (ref 26.0–34.0)
MCHC: 34.2 g/dL (ref 30.0–36.0)
MCV: 88 fL (ref 80.0–100.0)
Platelets: 304 10*3/uL (ref 150–400)
RBC: 5.49 MIL/uL (ref 4.22–5.81)
RDW: 13.1 % (ref 11.5–15.5)
WBC: 13.4 10*3/uL — ABNORMAL HIGH (ref 4.0–10.5)
nRBC: 0 % (ref 0.0–0.2)

## 2020-06-29 LAB — COMPREHENSIVE METABOLIC PANEL
ALT: 54 U/L — ABNORMAL HIGH (ref 0–44)
AST: 18 U/L (ref 15–41)
Albumin: 2.7 g/dL — ABNORMAL LOW (ref 3.5–5.0)
Alkaline Phosphatase: 62 U/L (ref 38–126)
Anion gap: 8 (ref 5–15)
BUN: 33 mg/dL — ABNORMAL HIGH (ref 8–23)
CO2: 25 mmol/L (ref 22–32)
Calcium: 9.2 mg/dL (ref 8.9–10.3)
Chloride: 100 mmol/L (ref 98–111)
Creatinine, Ser: 1.02 mg/dL (ref 0.61–1.24)
GFR, Estimated: >60 mL/min (ref >60)
Glucose, Bld: 313 mg/dL — ABNORMAL HIGH (ref 70–99)
Potassium: 5.8 mmol/L — ABNORMAL HIGH (ref 3.5–5.1)
Sodium: 133 mmol/L — ABNORMAL LOW (ref 135–145)
Total Bilirubin: 1.2 mg/dL (ref 0.3–1.2)
Total Protein: 5.8 g/dL — ABNORMAL LOW (ref 6.5–8.1)

## 2020-06-29 LAB — GLUCOSE, CAPILLARY
Glucose-Capillary: 256 mg/dL — ABNORMAL HIGH (ref 70–99)
Glucose-Capillary: 242 mg/dL — ABNORMAL HIGH (ref 70–99)
Glucose-Capillary: 362 mg/dL — ABNORMAL HIGH (ref 70–99)
Glucose-Capillary: 365 mg/dL — ABNORMAL HIGH (ref 70–99)

## 2020-06-29 NOTE — Plan of Care (Signed)
  Problem: Education: Goal: Knowledge of risk factors and measures for prevention of condition will improve 06/29/2020 1058 by Burnice Logan, LPN Outcome: Progressing 06/29/2020 0454 by Burnice Logan, LPN Outcome: Progressing   Problem: Coping: Goal: Psychosocial and spiritual needs will be supported 06/29/2020 1058 by Burnice Logan, LPN Outcome: Progressing 06/29/2020 0454 by Burnice Logan, LPN Outcome: Progressing   Problem: Respiratory: Goal: Will maintain a patent airway 06/29/2020 1058 by Burnice Logan, LPN Outcome: Progressing 06/29/2020 0454 by Burnice Logan, LPN Outcome: Progressing Goal: Complications related to the disease process, condition or treatment will be avoided or minimized 06/29/2020 1058 by Burnice Logan, LPN Outcome: Progressing 06/29/2020 0454 by Burnice Logan, LPN Outcome: Progressing   Problem: Education: Goal: Knowledge of General Education information will improve Description: Including pain rating scale, medication(s)/side effects and non-pharmacologic comfort measures 06/29/2020 1058 by Burnice Logan, LPN Outcome: Progressing 06/29/2020 0454 by Burnice Logan, LPN Outcome: Progressing   Problem: Health Behavior/Discharge Planning: Goal: Ability to manage health-related needs will improve 06/29/2020 1058 by Burnice Logan, LPN Outcome: Progressing 06/29/2020 0454 by Burnice Logan, LPN Outcome: Progressing   Problem: Clinical Measurements: Goal: Ability to maintain clinical measurements within normal limits will improve 06/29/2020 1058 by Burnice Logan, LPN Outcome: Progressing 06/29/2020 0454 by Burnice Logan, LPN Outcome: Progressing Goal: Will remain free from infection 06/29/2020 1058 by Burnice Logan, LPN Outcome: Progressing 06/29/2020 0454 by Burnice Logan, LPN Outcome: Progressing Goal: Diagnostic test results will improve 06/29/2020 1058 by Burnice Logan, LPN Outcome: Progressing 06/29/2020 0454 by Burnice Logan, LPN Outcome: Progressing Goal: Respiratory complications will improve 06/29/2020 1058 by Burnice Logan, LPN Outcome: Progressing 06/29/2020 0454 by Burnice Logan, LPN Outcome: Progressing Goal: Cardiovascular complication will be avoided 06/29/2020 1058 by Burnice Logan, LPN Outcome: Progressing 06/29/2020 0454 by Burnice Logan, LPN Outcome: Progressing   Problem: Activity: Goal: Risk for activity intolerance will decrease 06/29/2020 1058 by Burnice Logan, LPN Outcome: Progressing 06/29/2020 0454 by Burnice Logan, LPN Outcome: Progressing   Problem: Nutrition: Goal: Adequate nutrition will be maintained 06/29/2020 1058 by Burnice Logan, LPN Outcome: Progressing 06/29/2020 0454 by Burnice Logan, LPN Outcome: Progressing   Problem: Coping: Goal: Level of anxiety will decrease 06/29/2020 1058 by Burnice Logan, LPN Outcome: Progressing 06/29/2020 0454 by Burnice Logan, LPN Outcome: Progressing   Problem: Elimination: Goal: Will not experience complications related to bowel motility 06/29/2020 1058 by Burnice Logan, LPN Outcome: Progressing 06/29/2020 0454 by Burnice Logan, LPN Outcome: Progressing Goal: Will not experience complications related to urinary retention 06/29/2020 1058 by Burnice Logan, LPN Outcome: Progressing 06/29/2020 0454 by Burnice Logan, LPN Outcome: Progressing   Problem: Pain Managment: Goal: General experience of comfort will improve 06/29/2020 1058 by Burnice Logan, LPN Outcome: Progressing 06/29/2020 0454 by Burnice Logan, LPN Outcome: Progressing   Problem: Safety: Goal: Ability to remain free from injury will improve 06/29/2020 1058 by Burnice Logan, LPN Outcome: Progressing 06/29/2020 0454 by Burnice Logan, LPN Outcome: Progressing   Problem: Skin Integrity: Goal: Risk  for impaired skin integrity will decrease 06/29/2020 1058 by Burnice Logan, LPN Outcome: Progressing 06/29/2020 0454 by Burnice Logan, LPN Outcome: Progressing

## 2020-06-29 NOTE — Plan of Care (Signed)

## 2020-06-29 NOTE — Progress Notes (Signed)
PROGRESS NOTE    Bradley French  [id number]:[id number] DOB: June 09, 1940 DOA: 23-Jun-2020 PCP: [facility]    Brief Narrative:  80 y.o.malewith medical history significant ofCAD s/p CABG in 2006, HTN, IIDM, OSA on CPAP,COPD, presented with cough and increasing short of breath. Patient started to have symptoms of dry cough and shortness of breath 6 days ago, and he went to possible COVID-19 5 days ago. He also had subjective fever and chills, loss of appetite but no abdominal pain or diarrhea. He has not been eating or drinking much for the last 3 to 4 days. He was vaccinated for COVID x 2. Went to see his PCP who checked his pulse ox in the office and found low and told him to come to ED. COVID-positive with chest x-ray showed bilateral multifocal infiltrates.88% on room air, stabilized on 3L 95%.  Assessment & Plan:   Active Problems:   COVID-19 virus infection   COVID-19  Acute hypoxic respiratory failure secondary toCOVID PNA COPD exacerbation - completed remdes 06/19/20 - continued on steroids and baricitinib (End 1/28) - Remains on high flow, continues to require around 7L - Now off isolation. Encourage pt to work w/ PT and ambulate -continue solumedrol, lovenox, combivent; -Cont to encourage ambulation as tolerated -Continue with bronchodilator as needed  DM2s - continue SSI, glucose checks, DM diet -continue SSI -lantus increased to 18 units and novolog increased to 10 units per diabetic coordinator  Severe dehydration without creatinine elevation, POA - increase PO intake as tolerated - completed fluid recussitation -Remains stable at this time  Deconditioning, ambulatory dysfunction, POA Failure to thrive - continue dietary supplement, marinol - PT: SNF vs HHPT w/ 24-hr supervision -Pending dispo. TOC continues to follow  CAD status post CABG - ASA, metoprolol  HTN - Continue with metoprolol,  amlodipine, losartan  DVT prophylaxis: Lovenox subq Code Status: Full Family Communication: Pt in room, family not at bedside  Status is: Inpatient  Remains inpatient appropriate because:Unsafe d/c plan and Inpatient level of care appropriate due to severity of illness   Dispo: The patient is from: Home              Anticipated d/c is to: SNF              Anticipated d/c date is: > 3 days              Patient currently is not medically stable to d/c.   Difficult to place patient No    Consultants:     Procedures:     Antimicrobials: Anti-infectives (From admission, onward)   Start     Dose/Rate Route Frequency Ordered Stop   06/19/20 1100  remdesivir 100 mg in sodium chloride 0.9 % 100 mL IVPB        100 mg 200 mL/hr over 30 Minutes Intravenous  Once 06/19/20 1003 06/20/20 0859   06/15/20 1000  remdesivir 100 mg in sodium chloride 0.9 % 100 mL IVPB       "Followed by" Linked Group Details   100 mg 200 mL/hr over 30 Minutes Intravenous Daily 2020/06/23 1912 06/19/20 0959   23-Jun-2020 2100  remdesivir 200 mg in sodium chloride 0.9% 250 mL IVPB       "Followed by" Linked Group Details   200 mg 580 mL/hr over 30 Minutes Intravenous Once 06-23-2020 1912 06/23/2020 2242      Subjective: Without complaints this AM  Objective: Vitals:   06/28/20 2145 06/29/20 0412 06/29/20 1300 06/29/20 1539  BP: 112/65 (!) 114/55 113/60   Pulse: 81 91 92   Resp: 19 17 (!) 24   Temp: 98 F (36.7 C) 97.7 F (36.5 C) 97.7 F (36.5 C)   TempSrc: Oral     SpO2: 97% 94% 98% 97%  Weight:      Height:        Intake/Output Summary (Last 24 hours) at 06/29/2020 1556 Last data filed at 06/28/2020 1723 Gross per 24 hour  Intake 240 ml  Output 225 ml  Net 15 ml   Filed Weights   2020/06/18 1039  Weight: 108.9 kg    Examination: General exam: Conversant, in no acute distress Respiratory system: normal chest rise, clear, no audible wheezing Cardiovascular system: regular rhythm,  s1-s2 Gastrointestinal system: Nondistended, nontender, pos BS Central nervous system: No seizures, no tremors Extremities: No cyanosis, no joint deformities Skin: No rashes, no pallor Psychiatry: Affect normal // no auditory hallucinations   Data Reviewed: I have personally reviewed following labs and imaging studies  CBC: Recent Labs  Lab 06/23/20 0255 06/24/20 0419 06/25/20 0331 06/26/20 0304 06/29/20 0216  WBC 9.1 12.4* 11.7* 10.9* 13.4*  NEUTROABS  --   --   --  9.6*  --   HGB 15.8 16.0 17.1* 16.4 16.5  HCT 46.3 47.3 48.6 47.0 48.3  MCV 88.9 89.1 87.7 88.0 88.0  PLT 368 391 414* 395 297   Basic Metabolic Panel: Recent Labs  Lab 06/23/20 0255 06/24/20 0419 06/25/20 0331 06/26/20 0304 06/29/20 0216  NA 137 136 137 136 133*  K 5.1 5.4* 5.3* 5.2* 5.8*  CL 102 102 101 101 100  CO2 26 26 26 24 25   GLUCOSE 365* 322* 229* 267* 313*  BUN 22 23 22  27* 33*  CREATININE 0.88 0.96 0.82 0.98 1.02  CALCIUM 9.1 9.0 9.3 8.8* 9.2  MG  --   --  2.5* 2.6*  --    GFR: Estimated Creatinine Clearance: 80.7 mL/min (by C-G formula based on SCr of 1.02 mg/dL). Liver Function Tests: Recent Labs  Lab 06/26/20 0304 06/29/20 0216  AST 16 18  ALT 39 54*  ALKPHOS 64 62  BILITOT 1.3* 1.2  PROT 6.1* 5.8*  ALBUMIN 2.8* 2.7*   No results for input(s): LIPASE, AMYLASE in the last 168 hours. No results for input(s): AMMONIA in the last 168 hours. Coagulation Profile: No results for input(s): INR, PROTIME in the last 168 hours. Cardiac Enzymes: No results for input(s): CKTOTAL, CKMB, CKMBINDEX, TROPONINI in the last 168 hours. BNP (last 3 results) No results for input(s): PROBNP in the last 8760 hours. HbA1C: No results for input(s): HGBA1C in the last 72 hours. CBG: Recent Labs  Lab 06/28/20 1156 06/28/20 1533 06/28/20 2143 06/29/20 0804 06/29/20 1217  GLUCAP 179* 368* 370* 256* 242*   Lipid Profile: No results for input(s): CHOL, HDL, LDLCALC, TRIG, CHOLHDL, LDLDIRECT in  the last 72 hours. Thyroid Function Tests: No results for input(s): TSH, T4TOTAL, FREET4, T3FREE, THYROIDAB in the last 72 hours. Anemia Panel: No results for input(s): VITAMINB12, FOLATE, FERRITIN, TIBC, IRON, RETICCTPCT in the last 72 hours. Sepsis Labs: No results for input(s): PROCALCITON, LATICACIDVEN in the last 168 hours.  No results found for this or any previous visit (from the past 240 hour(s)).   Radiology Studies: No results found.  Scheduled Meds: . amLODipine  10 mg Oral Q2000  . aspirin EC  81 mg Oral Q2000  . diclofenac Sodium  2 g Topical Q2000  . dronabinol  2.5 mg  Oral BID AC  . enoxaparin (LOVENOX) injection  40 mg Subcutaneous Q12H  . feeding supplement (GLUCERNA SHAKE)  237 mL Oral TID BM  . insulin aspart  0-15 Units Subcutaneous TID WC  . insulin aspart  0-5 Units Subcutaneous QHS  . insulin aspart  10 Units Subcutaneous TID WC  . insulin glargine  18 Units Subcutaneous QHS  . Ipratropium-Albuterol  1 puff Inhalation TID  . methylPREDNISolone (SOLU-MEDROL) injection  40 mg Intravenous Q12H  . multivitamin with minerals  1 tablet Oral Daily  . nicotine  21 mg Transdermal Daily  . polyethylene glycol  17 g Oral BID   Continuous Infusions:   LOS: 15 days   Marylu Lund, MD [facility] Pager On Amion  If 7PM-7AM, please contact night-coverage 06/29/2020, 3:56 PM

## 2020-06-30 DIAGNOSIS — R0902 Hypoxemia: Secondary | ICD-10-CM | POA: Diagnosis not present

## 2020-06-30 DIAGNOSIS — U071 COVID-19: Secondary | ICD-10-CM | POA: Diagnosis not present

## 2020-06-30 LAB — BASIC METABOLIC PANEL
Anion gap: 11 (ref 5–15)
Anion gap: 13 (ref 5–15)
BUN: 31 mg/dL — ABNORMAL HIGH (ref 8–23)
BUN: 33 mg/dL — ABNORMAL HIGH (ref 8–23)
CO2: 23 mmol/L (ref 22–32)
CO2: 23 mmol/L (ref 22–32)
Calcium: 9 mg/dL (ref 8.9–10.3)
Calcium: 9 mg/dL (ref 8.9–10.3)
Chloride: 99 mmol/L (ref 98–111)
Chloride: 99 mmol/L (ref 98–111)
Creatinine, Ser: 1.12 mg/dL (ref 0.61–1.24)
Creatinine, Ser: 1.07 mg/dL (ref 0.61–1.24)
GFR, Estimated: >60 mL/min (ref >60)
GFR, Estimated: >60 mL/min (ref >60)
Glucose, Bld: 331 mg/dL — ABNORMAL HIGH (ref 70–99)
Glucose, Bld: 273 mg/dL — ABNORMAL HIGH (ref 70–99)
Potassium: 6.8 mmol/L (ref 3.5–5.1)
Potassium: 6.8 mmol/L (ref 3.5–5.1)
Sodium: 133 mmol/L — ABNORMAL LOW (ref 135–145)
Sodium: 135 mmol/L (ref 135–145)

## 2020-06-30 LAB — POTASSIUM: Potassium: 5.8 mmol/L — ABNORMAL HIGH (ref 3.5–5.1)

## 2020-06-30 LAB — GLUCOSE, CAPILLARY
Glucose-Capillary: 200 mg/dL — ABNORMAL HIGH (ref 70–99)
Glucose-Capillary: 328 mg/dL — ABNORMAL HIGH (ref 70–99)
Glucose-Capillary: 278 mg/dL — ABNORMAL HIGH (ref 70–99)
Glucose-Capillary: 279 mg/dL — ABNORMAL HIGH (ref 70–99)

## 2020-06-30 MED ORDER — INSULIN ASPART 100 UNIT/ML IV SOLN
10.0000 [IU] | Freq: Once | INTRAVENOUS | Status: AC
  Administered 2020-06-30: 10 [IU] via INTRAVENOUS

## 2020-06-30 MED ORDER — ALBUTEROL SULFATE (2.5 MG/3ML) 0.083% IN NEBU
2.5000 mg | INHALATION_SOLUTION | RESPIRATORY_TRACT | Status: DC | PRN
  Administered 2020-07-06: 2.5 mg via RESPIRATORY_TRACT
  Filled 2020-06-30: qty 3

## 2020-06-30 MED ORDER — SODIUM ZIRCONIUM CYCLOSILICATE 5 G PO PACK
5.0000 g | PACK | Freq: Two times a day (BID) | ORAL | Status: DC
  Filled 2020-06-30: qty 1

## 2020-06-30 MED ORDER — IPRATROPIUM-ALBUTEROL 0.5-2.5 (3) MG/3ML IN SOLN
3.0000 mL | Freq: Four times a day (QID) | RESPIRATORY_TRACT | Status: DC
  Administered 2020-06-30 (×3): 3 mL via RESPIRATORY_TRACT
  Filled 2020-06-30 (×3): qty 3

## 2020-06-30 MED ORDER — SODIUM BICARBONATE 8.4 % IV SOLN
50.0000 meq | Freq: Once | INTRAVENOUS | Status: AC
  Administered 2020-06-30: 50 meq via INTRAVENOUS
  Filled 2020-06-30: qty 50

## 2020-06-30 MED ORDER — ALBUTEROL SULFATE (2.5 MG/3ML) 0.083% IN NEBU
10.0000 mg | INHALATION_SOLUTION | Freq: Once | RESPIRATORY_TRACT | Status: AC
  Administered 2020-06-30: 2.5 mg via RESPIRATORY_TRACT
  Filled 2020-06-30: qty 12

## 2020-06-30 MED ORDER — INSULIN GLARGINE 100 UNIT/ML ~~LOC~~ SOLN
22.0000 [IU] | Freq: Every day | SUBCUTANEOUS | Status: DC
  Administered 2020-06-30: 22 [IU] via SUBCUTANEOUS
  Filled 2020-06-30 (×2): qty 0.22

## 2020-06-30 MED ORDER — SODIUM ZIRCONIUM CYCLOSILICATE 5 G PO PACK
5.0000 g | PACK | Freq: Every day | ORAL | Status: DC
  Administered 2020-06-30: 5 g via ORAL
  Filled 2020-06-30: qty 1

## 2020-06-30 MED ORDER — ALBUTEROL SULFATE (2.5 MG/3ML) 0.083% IN NEBU: 2.5000 mg | INHALATION_SOLUTION | RESPIRATORY_TRACT | Status: DC | PRN

## 2020-06-30 MED ORDER — SODIUM ZIRCONIUM CYCLOSILICATE 5 G PO PACK
5.0000 g | PACK | Freq: Two times a day (BID) | ORAL | Status: DC
  Administered 2020-06-30 – 2020-07-03 (×7): 5 g via ORAL
  Filled 2020-06-30 (×8): qty 1

## 2020-06-30 NOTE — Progress Notes (Signed)
PROGRESS NOTE    Bradley French  [id number]:[id number] DOB: [birth year] DOA: 2020/06/29 PCP: [facility]    Brief Narrative:  80 y.o.malewith medical history significant ofCAD s/p CABG in 2006, HTN, IIDM, OSA on CPAP,COPD, presented with cough and increasing short of breath. Patient started to have symptoms of dry cough and shortness of breath 6 days ago, and he went to possible COVID-19 5 days ago. He also had subjective fever and chills, loss of appetite but no abdominal pain or diarrhea. He has not been eating or drinking much for the last 3 to 4 days. He was vaccinated for COVID x 2. Went to see his PCP who checked his pulse ox in the office and found low and told him to come to ED. COVID-positive with chest x-ray showed bilateral multifocal infiltrates.88% on room air, stabilized on 3L 95%.  Assessment & Plan:   Active Problems:   COVID-19 virus infection   COVID-19  Acute hypoxic respiratory failure secondary toCOVID PNA COPD exacerbation - completed remdes 06/19/20 - continued on steroids and baricitinib (End 1/28) - Remains on high flow, continues to require around 7L - Now off isolation. Encourage pt to work w/ PT and ambulate -continue solumedrol, lovenox, combivent; -Cont to encourage ambulation as pt tolerates -Continue with bronchodilator as needed  DM2s - continue SSI, glucose checks, DM diet -continue SSI -lantus at 18 units and novolog currently at 10 units -glucose remains in the 200-300 range, thus will increase lantus to 22 units  Severe dehydration without creatinine elevation, POA - increase PO intake as tolerated - completed fluid recussitation -Remains stable currently  Deconditioning, ambulatory dysfunction, POA Failure to thrive - continue dietary supplement, marinol - PT: SNF vs HHPT w/ 24-hr supervision -Pending dispo. TOC continues to follow  CAD status post CABG - Continued  with ASA, metoprolol  HTN - Continue with metoprolol, amlodipine, losartan  DVT prophylaxis: Lovenox subq Code Status: Full Family Communication: Pt in room, family not at bedside  Status is: Inpatient  Remains inpatient appropriate because:Unsafe d/c plan and Inpatient level of care appropriate due to severity of illness  Dispo: The patient is from: Home              Anticipated d/c is to: SNF              Anticipated d/c date is: > 3 days              Patient currently is not medically stable to d/c.   Difficult to place patient No   Consultants:     Procedures:     Antimicrobials: Anti-infectives (From admission, onward)   Start     Dose/Rate Route Frequency Ordered Stop   06/19/20 1100  remdesivir 100 mg in sodium chloride 0.9 % 100 mL IVPB        100 mg 200 mL/hr over 30 Minutes Intravenous  Once 06/19/20 1003 06/20/20 0859   06/15/20 1000  remdesivir 100 mg in sodium chloride 0.9 % 100 mL IVPB       "Followed by" Linked Group Details   100 mg 200 mL/hr over 30 Minutes Intravenous Daily 06-29-2020 1912 06/19/20 0959   06-29-2020 2100  remdesivir 200 mg in sodium chloride 0.9% 250 mL IVPB       "Followed by" Linked Group Details   200 mg 580 mL/hr over 30 Minutes Intravenous Once 06/29/2020 1912 2020/06/29 2242      Subjective: No complaints this AM. Pt's wife at bedside  Objective:  Vitals:   06/30/20 0600 06/30/20 1004 06/30/20 1431 06/30/20 1435  BP: 118/71  (!) 121/52   Pulse: 81  92   Resp: 20  18   Temp: 98.2 F (36.8 C)  98.5 F (36.9 C)   TempSrc: Oral     SpO2: 99% 96% 96% 93%  Weight:      Height:       No intake or output data in the 24 hours ending 06/30/20 1541 Filed Weights   06/29/2020 1039  Weight: 108.9 kg    Examination: General exam: Awake, laying in bed, in nad Respiratory system: Normal respiratory effort, no audible wheezing Cardiovascular system: regular rate, s1, s2 Gastrointestinal system: Soft, nondistended, positive  BS Central nervous system: CN2-12 grossly intact, strength intact Extremities: Perfused, no clubbing Skin: Normal skin turgor, no notable skin lesions seen Psychiatry: Mood normal // no visual hallucinations   Data Reviewed: I have personally reviewed following labs and imaging studies  CBC: Recent Labs  Lab 06/24/20 0419 06/25/20 0331 06/26/20 0304 06/29/20 0216  WBC 12.4* 11.7* 10.9* 13.4*  NEUTROABS  --   --  9.6*  --   HGB 16.0 17.1* 16.4 16.5  HCT 47.3 48.6 47.0 48.3  MCV 89.1 87.7 88.0 88.0  PLT 391 414* 395 778   Basic Metabolic Panel: Recent Labs  Lab 06/24/20 0419 06/25/20 0331 06/26/20 0304 06/29/20 0216 06/30/20 1034  NA 136 137 136 133* 133*  K 5.4* 5.3* 5.2* 5.8* 6.8*  CL 102 101 101 100 99  CO2 26 26 24 25 23   GLUCOSE 322* 229* 267* 313* 331*  BUN 23 22 27* 33* 31*  CREATININE 0.96 0.82 0.98 1.02 1.12  CALCIUM 9.0 9.3 8.8* 9.2 9.0  MG  --  2.5* 2.6*  --   --    GFR: Estimated Creatinine Clearance: 73.5 mL/min (by C-G formula based on SCr of 1.12 mg/dL). Liver Function Tests: Recent Labs  Lab 06/26/20 0304 06/29/20 0216  AST 16 18  ALT 39 54*  ALKPHOS 64 62  BILITOT 1.3* 1.2  PROT 6.1* 5.8*  ALBUMIN 2.8* 2.7*   No results for input(s): LIPASE, AMYLASE in the last 168 hours. No results for input(s): AMMONIA in the last 168 hours. Coagulation Profile: No results for input(s): INR, PROTIME in the last 168 hours. Cardiac Enzymes: No results for input(s): CKTOTAL, CKMB, CKMBINDEX, TROPONINI in the last 168 hours. BNP (last 3 results) No results for input(s): PROBNP in the last 8760 hours. HbA1C: No results for input(s): HGBA1C in the last 72 hours. CBG: Recent Labs  Lab 06/29/20 1217 06/29/20 1637 06/29/20 2106 06/30/20 0733 06/30/20 1132  GLUCAP 242* 362* 365* 200* 328*   Lipid Profile: No results for input(s): CHOL, HDL, LDLCALC, TRIG, CHOLHDL, LDLDIRECT in the last 72 hours. Thyroid Function Tests: No results for input(s): TSH,  T4TOTAL, FREET4, T3FREE, THYROIDAB in the last 72 hours. Anemia Panel: No results for input(s): VITAMINB12, FOLATE, FERRITIN, TIBC, IRON, RETICCTPCT in the last 72 hours. Sepsis Labs: No results for input(s): PROCALCITON, LATICACIDVEN in the last 168 hours.  No results found for this or any previous visit (from the past 240 hour(s)).   Radiology Studies: No results found.  Scheduled Meds: . amLODipine  10 mg Oral Q2000  . aspirin EC  81 mg Oral Q2000  . diclofenac Sodium  2 g Topical Q2000  . dronabinol  2.5 mg Oral BID AC  . enoxaparin (LOVENOX) injection  40 mg Subcutaneous Q12H  . feeding supplement (GLUCERNA SHAKE)  237 mL Oral TID BM  . insulin aspart  0-15 Units Subcutaneous TID WC  . insulin aspart  0-5 Units Subcutaneous QHS  . insulin aspart  10 Units Subcutaneous TID WC  . insulin glargine  18 Units Subcutaneous QHS  . ipratropium-albuterol  3 mL Nebulization Q6H  . methylPREDNISolone (SOLU-MEDROL) injection  40 mg Intravenous Q12H  . multivitamin with minerals  1 tablet Oral Daily  . nicotine  21 mg Transdermal Daily  . polyethylene glycol  17 g Oral BID  . sodium zirconium cyclosilicate  5 g Oral BID   Continuous Infusions:   LOS: 16 days   Marylu Lund, MD [facility] Pager On Amion  If 7PM-7AM, please contact night-coverage 06/30/2020, 3:41 PM

## 2020-06-30 NOTE — Progress Notes (Signed)
CRITICAL VALUE ALERT  Critical Value:  6.8  Date & Time Notied:  1249 1/30  Provider Notified: yes, dr. Wyline Copas  Orders Received/Actions taken: pending

## 2020-06-30 NOTE — Plan of Care (Signed)
  Problem: Elimination: Goal: Will not experience complications related to urinary retention Outcome: Progressing   Problem: Pain Managment: Goal: General experience of comfort will improve Outcome: Progressing   Problem: Safety: Goal: Ability to remain free from injury will improve Outcome: Progressing   

## 2020-06-30 NOTE — Progress Notes (Signed)
Upon RT's arrival to pt's room, SpO2 79% on 7L [place]. O2 increased to 9L for SpO2 goal 88-90%. RT will continue to monitor and wean O2 as pt tolerates.

## 2020-06-30 NOTE — Progress Notes (Signed)
CRITICAL VALUE ALERT  Critical Value:  6.8  Date & Time Notied:  07/01/1639  Provider Notified: yes  Orders Received/Actions taken: existing orders followed

## 2020-07-01 ENCOUNTER — Inpatient Hospital Stay (HOSPITAL_COMMUNITY): Payer: No Typology Code available for payment source

## 2020-07-01 DIAGNOSIS — U071 COVID-19: Secondary | ICD-10-CM | POA: Diagnosis not present

## 2020-07-01 DIAGNOSIS — R0902 Hypoxemia: Secondary | ICD-10-CM | POA: Diagnosis not present

## 2020-07-01 LAB — CBC
HCT: 47.4 % (ref 39.0–52.0)
Hemoglobin: 16.8 g/dL (ref 13.0–17.0)
MCH: 31.1 pg (ref 26.0–34.0)
MCHC: 35.4 g/dL (ref 30.0–36.0)
MCV: 87.6 fL (ref 80.0–100.0)
Platelets: 253 10*3/uL (ref 150–400)
RBC: 5.41 MIL/uL (ref 4.22–5.81)
RDW: 13.3 % (ref 11.5–15.5)
WBC: 13.7 10*3/uL — ABNORMAL HIGH (ref 4.0–10.5)
nRBC: 0 % (ref 0.0–0.2)

## 2020-07-01 LAB — COMPREHENSIVE METABOLIC PANEL
ALT: 55 U/L — ABNORMAL HIGH (ref 0–44)
AST: 17 U/L (ref 15–41)
Albumin: 2.8 g/dL — ABNORMAL LOW (ref 3.5–5.0)
Alkaline Phosphatase: 61 U/L (ref 38–126)
Anion gap: 11 (ref 5–15)
BUN: 26 mg/dL — ABNORMAL HIGH (ref 8–23)
CO2: 26 mmol/L (ref 22–32)
Calcium: 9.3 mg/dL (ref 8.9–10.3)
Chloride: 100 mmol/L (ref 98–111)
Creatinine, Ser: 0.94 mg/dL (ref 0.61–1.24)
GFR, Estimated: >60 mL/min (ref >60)
Glucose, Bld: 204 mg/dL — ABNORMAL HIGH (ref 70–99)
Potassium: 5.4 mmol/L — ABNORMAL HIGH (ref 3.5–5.1)
Sodium: 137 mmol/L (ref 135–145)
Total Bilirubin: 0.9 mg/dL (ref 0.3–1.2)
Total Protein: 5.8 g/dL — ABNORMAL LOW (ref 6.5–8.1)

## 2020-07-01 LAB — GLUCOSE, CAPILLARY
Glucose-Capillary: 205 mg/dL — ABNORMAL HIGH (ref 70–99)
Glucose-Capillary: 273 mg/dL — ABNORMAL HIGH (ref 70–99)
Glucose-Capillary: 352 mg/dL — ABNORMAL HIGH (ref 70–99)
Glucose-Capillary: 269 mg/dL — ABNORMAL HIGH (ref 70–99)

## 2020-07-01 MED ORDER — IOHEXOL 350 MG/ML SOLN
60.0000 mL | Freq: Once | INTRAVENOUS | Status: AC | PRN
  Administered 2020-07-01: 60 mL via INTRAVENOUS

## 2020-07-01 MED ORDER — INSULIN GLARGINE 100 UNIT/ML ~~LOC~~ SOLN
25.0000 [IU] | Freq: Every day | SUBCUTANEOUS | Status: DC
  Administered 2020-07-01 – 2020-07-02 (×2): 25 [IU] via SUBCUTANEOUS
  Filled 2020-07-01 (×3): qty 0.25

## 2020-07-01 MED ORDER — IPRATROPIUM-ALBUTEROL 0.5-2.5 (3) MG/3ML IN SOLN
3.0000 mL | Freq: Three times a day (TID) | RESPIRATORY_TRACT | Status: DC
  Administered 2020-07-01 – 2020-07-02 (×5): 3 mL via RESPIRATORY_TRACT
  Filled 2020-07-01 (×5): qty 3

## 2020-07-01 MED ORDER — INSULIN ASPART 100 UNIT/ML ~~LOC~~ SOLN
15.0000 [IU] | Freq: Three times a day (TID) | SUBCUTANEOUS | Status: DC
  Administered 2020-07-01 – 2020-07-02 (×3): 15 [IU] via SUBCUTANEOUS

## 2020-07-01 NOTE — Progress Notes (Signed)
PROGRESS NOTE    Bradley French  JQB:[id number] DOB: September 01, 1940 DOA: 2020/06/22 PCP: [facility]    Brief Narrative:  80 y.o.malewith medical history significant ofCAD s/p CABG in 2006, HTN, IIDM, OSA on CPAP,COPD, presented with cough and increasing short of breath. Patient started to have symptoms of dry cough and shortness of breath 6 days ago, and he went to possible COVID-19 5 days ago. He also had subjective fever and chills, loss of appetite but no abdominal pain or diarrhea. He has not been eating or drinking much for the last 3 to 4 days. He was vaccinated for COVID x 2. Went to see his PCP who checked his pulse ox in the office and found low and told him to come to ED. COVID-positive with chest x-ray showed bilateral multifocal infiltrates.88% on room air, stabilized on 3L 95%.  Assessment & Plan:   Active Problems:   COVID-19 virus infection   COVID-19  Acute hypoxic respiratory failure secondary toCOVID PNA COPD exacerbation - completed remdes 06/19/20 - continued on steroids and baricitinib (End 1/28) - Remains on high flow, overnight acutely needed up to 9L - Now off isolation. Encourage pt to work w/ PT and ambulate -continue solumedrol, lovenox, combivent; -Cont to encourage ambulation as pt tolerates -Continue with bronchodilator as needed -D-dimer noted to peak at over 10 on 1/21, currently 1.93 -Will check CTA chest  DM2s - continue SSI, glucose checks, DM diet -continue SSI -lantus at 25 units and novolog up to 15 units  Severe dehydration without creatinine elevation, POA - increase PO intake as tolerated - completed fluid recussitation -Remains stable currently  Deconditioning, ambulatory dysfunction, POA Failure to thrive - continue dietary supplement, marinol - PT: SNF vs HHPT w/ 24-hr supervision -Pending dispo. TOC is following  CAD status post CABG - Continued with ASA,  metoprolol  HTN - Continue with metoprolol, amlodipine, losartan  DVT prophylaxis: Lovenox subq Code Status: Full Family Communication: Pt in room, family not at bedside  Status is: Inpatient  Remains inpatient appropriate because:Unsafe d/c plan and Inpatient level of care appropriate due to severity of illness  Dispo: The patient is from: Home              Anticipated d/c is to: SNF              Anticipated d/c date is: > 3 days              Patient currently is not medically stable to d/c.   Difficult to place patient No   Consultants:     Procedures:     Antimicrobials: Anti-infectives (From admission, onward)   Start     Dose/Rate Route Frequency Ordered Stop   06/19/20 1100  remdesivir 100 mg in sodium chloride 0.9 % 100 mL IVPB        100 mg 200 mL/hr over 30 Minutes Intravenous  Once 06/19/20 1003 06/20/20 0859   06/15/20 1000  remdesivir 100 mg in sodium chloride 0.9 % 100 mL IVPB       "Followed by" Linked Group Details   100 mg 200 mL/hr over 30 Minutes Intravenous Daily 06-22-2020 1912 06/19/20 0959   22-Jun-2020 2100  remdesivir 200 mg in sodium chloride 0.9% 250 mL IVPB       "Followed by" Linked Group Details   200 mg 580 mL/hr over 30 Minutes Intravenous Once 2020/06/22 1912 22-Jun-2020 2242      Subjective: Reports acute sob last night while ambulating to  bathroom  Objective: Vitals:   07/01/20 0700 07/01/20 0854 07/01/20 1200 07/01/20 1415  BP: 129/69  108/66   Pulse: 83  91   Resp: 20  20   Temp: 98.5 F (36.9 C)  98.5 F (36.9 C)   TempSrc: Oral  Oral   SpO2:  92% 91% 91%  Weight:      Height:        Intake/Output Summary (Last 24 hours) at 07/01/2020 1517 Last data filed at 07/01/2020 1228 Gross per 24 hour  Intake --  Output 550 ml  Net -550 ml   Filed Weights   06/29/2020 1039  Weight: 108.9 kg    Examination: General exam: Conversant, in no acute distress Respiratory system: normal chest rise, clear, no audible  wheezing Cardiovascular system: regular rhythm, s1-s2 Gastrointestinal system: Nondistended, nontender, pos BS Central nervous system: No seizures, no tremors Extremities: No cyanosis, no joint deformities Skin: No rashes, no pallor Psychiatry: Affect normal // no auditory hallucinations   Data Reviewed: I have personally reviewed following labs and imaging studies  CBC: Recent Labs  Lab 06/25/20 0331 06/26/20 0304 06/29/20 0216 07/01/20 0056  WBC 11.7* 10.9* 13.4* 13.7*  NEUTROABS  --  9.6*  --   --   HGB 17.1* 16.4 16.5 16.8  HCT 48.6 47.0 48.3 47.4  MCV 87.7 88.0 88.0 87.6  PLT 414* 395 304 193   Basic Metabolic Panel: Recent Labs  Lab 06/25/20 0331 06/26/20 0304 06/29/20 0216 06/30/20 1034 06/30/20 1550 06/30/20 2016 07/01/20 0056  NA 137 136 133* 133* 135  --  137  K 5.3* 5.2* 5.8* 6.8* 6.8* 5.8* 5.4*  CL 101 101 100 99 99  --  100  CO2 26 24 25 23 23   --  26  GLUCOSE 229* 267* 313* 331* 273*  --  204*  BUN 22 27* 33* 31* 33*  --  26*  CREATININE 0.82 0.98 1.02 1.12 1.07  --  0.94  CALCIUM 9.3 8.8* 9.2 9.0 9.0  --  9.3  MG 2.5* 2.6*  --   --   --   --   --    GFR: Estimated Creatinine Clearance: 87.6 mL/min (by C-G formula based on SCr of 0.94 mg/dL). Liver Function Tests: Recent Labs  Lab 06/26/20 0304 06/29/20 0216 07/01/20 0056  AST 16 18 17   ALT 39 54* 55*  ALKPHOS 64 62 61  BILITOT 1.3* 1.2 0.9  PROT 6.1* 5.8* 5.8*  ALBUMIN 2.8* 2.7* 2.8*   No results for input(s): LIPASE, AMYLASE in the last 168 hours. No results for input(s): AMMONIA in the last 168 hours. Coagulation Profile: No results for input(s): INR, PROTIME in the last 168 hours. Cardiac Enzymes: No results for input(s): CKTOTAL, CKMB, CKMBINDEX, TROPONINI in the last 168 hours. BNP (last 3 results) No results for input(s): PROBNP in the last 8760 hours. HbA1C: No results for input(s): HGBA1C in the last 72 hours. CBG: Recent Labs  Lab 06/30/20 1132 06/30/20 1553  06/30/20 2144 07/01/20 0750 07/01/20 1215  GLUCAP 328* 278* 279* 205* 273*   Lipid Profile: No results for input(s): CHOL, HDL, LDLCALC, TRIG, CHOLHDL, LDLDIRECT in the last 72 hours. Thyroid Function Tests: No results for input(s): TSH, T4TOTAL, FREET4, T3FREE, THYROIDAB in the last 72 hours. Anemia Panel: No results for input(s): VITAMINB12, FOLATE, FERRITIN, TIBC, IRON, RETICCTPCT in the last 72 hours. Sepsis Labs: No results for input(s): PROCALCITON, LATICACIDVEN in the last 168 hours.  No results found for this or any previous  visit (from the past 240 hour(s)).   Radiology Studies: DG CHEST PORT 1 VIEW  Result Date: 07/01/2020 CLINICAL DATA:  Weakness.  Coronavirus infection. EXAM: PORTABLE CHEST 1 VIEW COMPARISON:  06/24/2020 FINDINGS: Previous median sternotomy and CABG. Widespread patchy pulmonary infiltrates persist, similar to the previous study allowing for technical differences. Findings could relate to ongoing pneumonia/ARDS or could be chronic scarring. No lobar consolidation or collapse. No visible effusion. IMPRESSION: No change. Widespread patchy pulmonary infiltrates. Findings could relate to ongoing pneumonia/ARDS or could be chronic scarring. Electronically Signed   By: Nelson Chimes M.D.   On: 07/01/2020 13:21    Scheduled Meds: . amLODipine  10 mg Oral Q2000  . aspirin EC  81 mg Oral Q2000  . diclofenac Sodium  2 g Topical Q2000  . dronabinol  2.5 mg Oral BID AC  . enoxaparin (LOVENOX) injection  40 mg Subcutaneous Q12H  . feeding supplement (GLUCERNA SHAKE)  237 mL Oral TID BM  . insulin aspart  0-15 Units Subcutaneous TID WC  . insulin aspart  0-5 Units Subcutaneous QHS  . insulin aspart  15 Units Subcutaneous TID WC  . insulin glargine  25 Units Subcutaneous QHS  . ipratropium-albuterol  3 mL Nebulization TID  . methylPREDNISolone (SOLU-MEDROL) injection  40 mg Intravenous Q12H  . multivitamin with minerals  1 tablet Oral Daily  . nicotine  21 mg  Transdermal Daily  . polyethylene glycol  17 g Oral BID  . sodium zirconium cyclosilicate  5 g Oral BID   Continuous Infusions:   LOS: 17 days   Marylu Lund, MD [facility] Pager On Amion  If 7PM-7AM, please contact night-coverage 07/01/2020, 3:17 PM

## 2020-07-01 NOTE — Plan of Care (Signed)
  Problem: Respiratory: Goal: Will maintain a patent airway Outcome: Progressing   Problem: Respiratory: Goal: Complications related to the disease process, condition or treatment will be avoided or minimized Outcome: Progressing   Problem: Nutrition: Goal: Adequate nutrition will be maintained Outcome: Progressing   Problem: Coping: Goal: Level of anxiety will decrease Outcome: Progressing   Problem: Clinical Measurements: Goal: Respiratory complications will improve Outcome: Not Progressing  Pt continues to have increasing oxygen demand

## 2020-07-01 NOTE — Progress Notes (Signed)
Inpatient Diabetes Program Recommendations  AACE/ADA: New Consensus Statement on Inpatient Glycemic Control (2015)  Target Ranges:  Prepandial:   less than 140 mg/dL      Peak postprandial:   less than 180 mg/dL (1-2 hours)      Critically ill patients:  140 - 180 mg/dL   Lab Results  Component Value Date   GLUCAP 205 (H) 07/01/2020   HGBA1C 5.9 (H) 04/12/2020    Review of Glycemic Control Results for KAULIN, CHAVES (MRN [id number]) as of 07/01/2020 10:52  Ref. Range 06/30/2020 11:32 06/30/2020 15:53 06/30/2020 21:44 07/01/2020 07:50  Glucose-Capillary Latest Ref Range: 70 - 99 mg/dL 328 (H) 278 (H) 279 (H) 205 (H)   Diabetes history:DM2 Outpatient Diabetes medications:metformin 500 mg QAM Current orders for Inpatient glycemic control:  Lantus 22 units Novolog 0-15 units tid + hs Novolog 10 units tid meal coverage  Solumedrol 40 mg Q12 hours Glucerna tid between meals  Inpatient Diabetes Program Recommendations:    If steroids remain at current dose consider: - Increase Lantus to 25 units QHS - Increase Novolog meal coverage to 15 units TID (assuming patient is consuming >50% of meals) -If appropriate, change diet to carb modified.  Thanks, Bronson Curb, MSN, RNC-OB Diabetes Coordinator [phone] (8a-5p)

## 2020-07-02 DIAGNOSIS — U071 COVID-19: Secondary | ICD-10-CM | POA: Diagnosis not present

## 2020-07-02 DIAGNOSIS — R0902 Hypoxemia: Secondary | ICD-10-CM | POA: Diagnosis not present

## 2020-07-02 LAB — GLUCOSE, CAPILLARY
Glucose-Capillary: 160 mg/dL — ABNORMAL HIGH (ref 70–99)
Glucose-Capillary: 282 mg/dL — ABNORMAL HIGH (ref 70–99)
Glucose-Capillary: 143 mg/dL — ABNORMAL HIGH (ref 70–99)
Glucose-Capillary: 239 mg/dL — ABNORMAL HIGH (ref 70–99)

## 2020-07-02 LAB — CBC
HCT: 46 % (ref 39.0–52.0)
Hemoglobin: 16.3 g/dL (ref 13.0–17.0)
MCH: 31 pg (ref 26.0–34.0)
MCHC: 35.4 g/dL (ref 30.0–36.0)
MCV: 87.6 fL (ref 80.0–100.0)
Platelets: 229 10*3/uL (ref 150–400)
RBC: 5.25 MIL/uL (ref 4.22–5.81)
RDW: 13.4 % (ref 11.5–15.5)
WBC: 12.7 10*3/uL — ABNORMAL HIGH (ref 4.0–10.5)
nRBC: 0 % (ref 0.0–0.2)

## 2020-07-02 LAB — COMPREHENSIVE METABOLIC PANEL
ALT: 62 U/L — ABNORMAL HIGH (ref 0–44)
AST: 20 U/L (ref 15–41)
Albumin: 2.7 g/dL — ABNORMAL LOW (ref 3.5–5.0)
Alkaline Phosphatase: 61 U/L (ref 38–126)
Anion gap: 8 (ref 5–15)
BUN: 26 mg/dL — ABNORMAL HIGH (ref 8–23)
CO2: 25 mmol/L (ref 22–32)
Calcium: 8.9 mg/dL (ref 8.9–10.3)
Chloride: 102 mmol/L (ref 98–111)
Creatinine, Ser: 0.77 mg/dL (ref 0.61–1.24)
GFR, Estimated: >60 mL/min (ref >60)
Glucose, Bld: 143 mg/dL — ABNORMAL HIGH (ref 70–99)
Potassium: 5.7 mmol/L — ABNORMAL HIGH (ref 3.5–5.1)
Sodium: 135 mmol/L (ref 135–145)
Total Bilirubin: 1.2 mg/dL (ref 0.3–1.2)
Total Protein: 6.3 g/dL — ABNORMAL LOW (ref 6.5–8.1)

## 2020-07-02 MED ORDER — CEFEPIME HCL 2 G IJ SOLR
2.0000 g | Freq: Three times a day (TID) | INTRAMUSCULAR | Status: AC
  Administered 2020-07-02 – 2020-07-07 (×15): 2 g via INTRAVENOUS
  Filled 2020-07-02 (×16): qty 2

## 2020-07-02 MED ORDER — INSULIN ASPART 100 UNIT/ML ~~LOC~~ SOLN
18.0000 [IU] | Freq: Three times a day (TID) | SUBCUTANEOUS | Status: DC
  Administered 2020-07-02 – 2020-07-03 (×2): 18 [IU] via SUBCUTANEOUS

## 2020-07-02 MED ORDER — VANCOMYCIN HCL 10 G IV SOLR
2250.0000 mg | Freq: Once | INTRAVENOUS | Status: AC
  Administered 2020-07-02: 2250 mg via INTRAVENOUS
  Filled 2020-07-02: qty 2000

## 2020-07-02 MED ORDER — IPRATROPIUM-ALBUTEROL 20-100 MCG/ACT IN AERS
1.0000 | INHALATION_SPRAY | Freq: Four times a day (QID) | RESPIRATORY_TRACT | Status: DC
  Administered 2020-07-02 – 2020-07-10 (×26): 1 via RESPIRATORY_TRACT
  Filled 2020-07-02: qty 4

## 2020-07-02 MED ORDER — BUDESONIDE 0.5 MG/2ML IN SUSP: 0.5000 mg | Freq: Two times a day (BID) | RESPIRATORY_TRACT | Status: DC

## 2020-07-02 MED ORDER — MOMETASONE FURO-FORMOTEROL FUM 100-5 MCG/ACT IN AERO
2.0000 | INHALATION_SPRAY | Freq: Two times a day (BID) | RESPIRATORY_TRACT | Status: DC
  Administered 2020-07-03 – 2020-07-10 (×15): 2 via RESPIRATORY_TRACT
  Filled 2020-07-02 (×2): qty 8.8

## 2020-07-02 MED ORDER — IPRATROPIUM-ALBUTEROL 0.5-2.5 (3) MG/3ML IN SOLN: 3.0000 mL | RESPIRATORY_TRACT | Status: DC

## 2020-07-02 MED ORDER — VANCOMYCIN HCL 1500 MG/300ML IV SOLN
1500.0000 mg | Freq: Two times a day (BID) | INTRAVENOUS | Status: DC
Start: 2020-07-03 — End: 2020-07-04
  Administered 2020-07-03 – 2020-07-04 (×3): 1500 mg via INTRAVENOUS
  Filled 2020-07-02 (×4): qty 300

## 2020-07-02 MED ORDER — GUAIFENESIN-DM 100-10 MG/5ML PO SYRP
15.0000 mL | ORAL_SOLUTION | ORAL | Status: DC | PRN
  Administered 2020-07-03 – 2020-07-10 (×2): 15 mL via ORAL
  Filled 2020-07-02 (×2): qty 15

## 2020-07-02 MED ORDER — ARFORMOTEROL TARTRATE 15 MCG/2ML IN NEBU: 15.0000 ug | INHALATION_SOLUTION | Freq: Two times a day (BID) | RESPIRATORY_TRACT | Status: DC

## 2020-07-02 NOTE — Progress Notes (Signed)
Transfered pt to bedside commode, O2 dropped to 79%, increased O2 to 15LPM, O2 did not increase, nonrebreather applied. O2 increased to 88%. Transferred back to bed, HFNC reapplied at 11LPM, O2 90%.
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
## 2020-07-02 NOTE — Progress Notes (Signed)
Physical Therapy Treatment Patient Details Name: Bradley French MRN: [id number] DOB: 1940-09-19 Today's Date: 07/02/2020    History of Present Illness 80 yo male presenting with cough and shortness of breath. Tested COVID-19 positive on 1/9. Vaccinated x2. PMH including CAD s/p CABG in 2006, HTN, IIDM, OSA on CPAP, and COPD.    PT Comments    Pt looks worse than his last PT session with me.  He has increased WOB, increased RR at rest, and increased time of recovery after very little mobility (we stood EOB only today, unable to walk).  O2 sats dropped to 77% on 12 L HFNC and it took nearly 10 mins to return to the upper 80s after returning to supine.  This is a significant decline over last session where he was able to walk to the door and back with RW on  7L.  PT will continue to follow acutely for safe mobility progression   Follow Up Recommendations  SNF     Equipment Recommendations  Other (comment) (O2)    Recommendations for Other Services       Precautions / Restrictions Precautions Precautions: Fall Precaution Comments: monitor sats    Mobility  Bed Mobility Overal bed mobility: Modified Independent             General bed mobility comments: Pt moves quickly to EOB with HOB minimally raised and light use of rail.  Transfers Overall transfer level: Needs assistance Equipment used: Rolling walker (2 wheeled) Transfers: Sit to/from Stand Sit to Stand: Min guard         General transfer comment: Min guard due to speed of movement, impulsivity, reaches immediately for urinal no hands on RW with LOB min assist to recover, incontinent of urine on the floor, himself, legs, socks, gown.  Ambulation/Gait                 Stairs             Wheelchair Mobility    Modified Rankin (Stroke Patients Only)       Balance Overall balance assessment: Needs assistance Sitting-balance support: Feet supported;No upper extremity supported;Bilateral upper  extremity supported;Single extremity supported Sitting balance-Leahy Scale: Poor Sitting balance - Comments: needs external support for balance. Pt able to stand ~45 seconds before needing to sit down.  DOE 3-4/4, O2 on 12L HFNC and Pt RR in the mid 5s.  Sats decreased to 77 HR 110 bpm.  It took greater than 10 mins to recover in supine back to the upper 80s on 12 L.  PT noticed that his resting RR was high at rest with (+) accessory muscle use sats at 90% on 12 L when PT entered the room.                                    Cognition Arousal/Alertness: Awake/alert Behavior During Therapy: Flat affect;Impulsive Overall Cognitive Status: No family/caregiver present to determine baseline cognitive functioning                     Current Attention Level: Sustained   Following Commands: Follows one step commands consistently Safety/Judgement: Decreased awareness of safety Awareness: Intellectual   General Comments: Pt remains impulsive and quick to move, stating, "why is it so hard to breathe?" I related it back to his recent COVID infection and the damage that it did to his lungs.  Exercises Other Exercises Other Exercises: Pt reports compliance with flutter valve and IS.  Did not feel up to OOB to chair and could not attempt gait due to intolerance of just standing to urinate.    General Comments        Pertinent Vitals/Pain Pain Assessment: No/denies pain Pain Score: 0-No pain    Home Living                      Prior Function            PT Goals (current goals can now be found in the care plan section) Progress towards PT goals: Not progressing toward goals - comment (increased RR, WOB, and decreased sats on more O2 today)    Frequency    Min 2X/week      PT Plan Current plan remains appropriate    Co-evaluation              AM-PAC PT "6 Clicks" Mobility   Outcome Measure  Help needed turning from your back to your side  while in a flat bed without using bedrails?: A Little Help needed moving from lying on your back to sitting on the side of a flat bed without using bedrails?: A Little Help needed moving to and from a bed to a chair (including a wheelchair)?: A Little Help needed standing up from a chair using your arms (e.g., wheelchair or bedside chair)?: A Little Help needed to walk in hospital room?: A Little Help needed climbing 3-5 steps with a railing? : A Lot 6 Click Score: 17    End of Session Equipment Utilized During Treatment: Oxygen Activity Tolerance: Patient limited by fatigue Patient left: in bed;with bed alarm set;with call bell/phone within reach   PT Visit Diagnosis: Unsteadiness on feet (R26.81);Muscle weakness (generalized) (M62.81);Other abnormalities of gait and mobility (R26.89)     Time: 2951-8841 PT Time Calculation (min) (ACUTE ONLY): 19 min  Charges:  $Therapeutic Activity: 8-22 mins                     Verdene Lennert, PT, DPT  Acute Rehabilitation [phone] pager [phone]) [phone] office

## 2020-07-02 NOTE — Progress Notes (Signed)
Pharmacy Antibiotic Note  Bradley French is a 80 y.o. male admitted on Jun 24, 2020 with acute hypoxic respiratory failure secondary to COVID pneumonia.  Pharmacy has been consulted for cefepime and vancomycin dosing for HCAP (for 5-day course).  WBC 12.7, afebrile, Scr 0.77, CrCl 102.9 ml/min  Plan: Cefepime 2 gm IV Q 8 hrs X 5 days Vancomycin 2250 mg IV X 1, followed by vancomycin 1500 mg IV Q 12 hrs X 5 days (estimated vancomycin AUC, using Scr 0.8, is 467; goal vancomycin AUC is 400-550) Monitor WBC, temp, clinical improvement, renal function, vancomycin levels if indicated  Height: 6\' 4"  (193 cm) Weight: 108.9 kg (240 lb) IBW/kg (Calculated) : 86.8  Temp (24hrs), Avg:97.9 F (36.6 C), Min:97.3 F (36.3 C), Max:98.5 F (36.9 C)  Recent Labs  Lab 06/26/20 0304 06/29/20 0216 06/30/20 1034 06/30/20 1550 07/01/20 0056 07/02/20 0226  WBC 10.9* 13.4*  --   --  13.7* 12.7*  CREATININE 0.98 1.02 1.12 1.07 0.94 0.77    Estimated Creatinine Clearance: 102.9 mL/min (by C-G formula based on SCr of 0.77 mg/dL).    Allergies  Allergen Reactions  . Sulfonamide Derivatives Rash    Antimicrobials this admission: Remdesivir: 1/14-1/20 (total of 5 doses given) Cefepime: 2/1 >> (5 days) Vancomycin: 2/1 >> (5 days)  Microbiology results: 1/10 COVID: positive  Thank you for allowing pharmacy to be a part of this patient's care.  Gillermina Hu, PharmD, BCPS, [facility] Clinical Pharmacist 07/02/2020 5:28 PM

## 2020-07-02 NOTE — Plan of Care (Signed)

## 2020-07-02 NOTE — Progress Notes (Signed)
PROGRESS NOTE    Bradley French  [id number] DOB: [birth year] DOA: 07/01/2020 PCP: [facility]    Brief Narrative:  80 y.o.malewith medical history significant ofCAD s/p CABG in 2006, HTN, IIDM, OSA on CPAP,COPD, presented with cough and increasing short of breath. Patient started to have symptoms of dry cough and shortness of breath 6 days ago, and he went to possible COVID-19 5 days ago. He also had subjective fever and chills, loss of appetite but no abdominal pain or diarrhea. He has not been eating or drinking much for the last 3 to 4 days. He was vaccinated for COVID x 2. Went to see his PCP who checked his pulse ox in the office and found low and told him to come to ED. COVID-positive with chest x-ray showed bilateral multifocal infiltrates.88% on room air, stabilized on 3L 95%.  Assessment & Plan:   Active Problems:   COVID-19 virus infection   COVID-19  Acute hypoxic respiratory failure secondary toCOVID PNA COPD exacerbation - completed remdes 06/19/20 - continued on steroids and baricitinib (End 1/28) - Remains on high flow, overnight acutely needed up to 11L today on ambulation - Now off isolation. Encourage pt to work w/ PT and ambulate -continued on solumedrol, combivent; -Cont to encourage ambulation as pt tolerates -D-dimer noted to peak at over 10 on 1/21, currently 1.93 -CTA chest ordered and reviewed. Findings worrisome for multifocal PNA suggestive of covid PNA with small to moderate pneumomediastinum. Given imaging findings as well as difficulty weaning O2, would consult Pulm for additional recs  DM2s - continue SSI, glucose checks, DM diet -continue SSI -lantus at 25 units and novolog up to 18 units  Severe dehydration without creatinine elevation, POA - increase PO intake as tolerated - completed fluid recussitation -Pt remains stable at this time  Deconditioning, ambulatory dysfunction,  POA Failure to thrive - continue dietary supplement, marinol - PT: SNF vs HHPT w/ 24-hr supervision -TOC following for snf placement  CAD status post CABG - Continued with ASA, metoprolol  HTN - Continue with metoprolol, amlodipine, losartan -BP stable and controlled at this time  DVT prophylaxis: Lovenox subq Code Status: Full Family Communication: Pt in room, family not at bedside  Status is: Inpatient  Remains inpatient appropriate because:Unsafe d/c plan and Inpatient level of care appropriate due to severity of illness  Dispo: The patient is from: Home              Anticipated d/c is to: SNF              Anticipated d/c date is: > 3 days              Patient currently is not medically stable to d/c.   Difficult to place patient No   Consultants:     Procedures:     Antimicrobials: Anti-infectives (From admission, onward)   Start     Dose/Rate Route Frequency Ordered Stop   06/19/20 1100  remdesivir 100 mg in sodium chloride 0.9 % 100 mL IVPB        100 mg 200 mL/hr over 30 Minutes Intravenous  Once 06/19/20 1003 06/20/20 0859   06/15/20 1000  remdesivir 100 mg in sodium chloride 0.9 % 100 mL IVPB       "Followed by" Linked Group Details   100 mg 200 mL/hr over 30 Minutes Intravenous Daily 06/04/2020 1912 06/19/20 0959   06/13/2020 2100  remdesivir 200 mg in sodium chloride 0.9% 250 mL IVPB       "  Followed by" Linked Group Details   200 mg 580 mL/hr over 30 Minutes Intravenous Once 06/08/2020 1912 06/16/2020 2242      Subjective: Marked sob upon walking with RN this AM, now on 11LL high flow O2  Objective: Vitals:   07/02/20 0503 07/02/20 0736 07/02/20 0800 07/02/20 1338  BP: 107/61  119/67   Pulse: 87  96   Resp: 18  18   Temp: 97.9 F (36.6 C)  98 F (36.7 C)   TempSrc: Oral  Oral   SpO2: 90% 91%  95%  Weight:      Height:        Intake/Output Summary (Last 24 hours) at 07/02/2020 1522 Last data filed at 07/02/2020 1457 Gross per 24  hour  Intake --  Output 900 ml  Net -900 ml   Filed Weights   06/20/2020 1039  Weight: 108.9 kg    Examination: General exam: Awake, laying in bed, in nad Respiratory system: Normal respiratory effort, no wheezing Cardiovascular system: regular rate, s1, s2 Gastrointestinal system: Soft, nondistended, positive BS Central nervous system: CN2-12 grossly intact, strength intact Extremities: Perfused, no clubbing Skin: Normal skin turgor, no notable skin lesions seen Psychiatry: Mood normal // no visual hallucinations   Data Reviewed: I have personally reviewed following labs and imaging studies  CBC: Recent Labs  Lab 06/26/20 0304 06/29/20 0216 07/01/20 0056 07/02/20 0226  WBC 10.9* 13.4* 13.7* 12.7*  NEUTROABS 9.6*  --   --   --   HGB 16.4 16.5 16.8 16.3  HCT 47.0 48.3 47.4 46.0  MCV 88.0 88.0 87.6 87.6  PLT 395 304 253 315   Basic Metabolic Panel: Recent Labs  Lab 06/26/20 0304 06/29/20 0216 06/30/20 1034 06/30/20 1550 06/30/20 2016 07/01/20 0056 07/02/20 0226  NA 136 133* 133* 135  --  137 135  K 5.2* 5.8* 6.8* 6.8* 5.8* 5.4* 5.7*  CL 101 100 99 99  --  100 102  CO2 24 25 23 23   --  26 25  GLUCOSE 267* 313* 331* 273*  --  204* 143*  BUN 27* 33* 31* 33*  --  26* 26*  CREATININE 0.98 1.02 1.12 1.07  --  0.94 0.77  CALCIUM 8.8* 9.2 9.0 9.0  --  9.3 8.9  MG 2.6*  --   --   --   --   --   --    GFR: Estimated Creatinine Clearance: 102.9 mL/min (by C-G formula based on SCr of 0.77 mg/dL). Liver Function Tests: Recent Labs  Lab 06/26/20 0304 06/29/20 0216 07/01/20 0056 07/02/20 0226  AST 16 18 17 20   ALT 39 54* 55* 62*  ALKPHOS 64 62 61 61  BILITOT 1.3* 1.2 0.9 1.2  PROT 6.1* 5.8* 5.8* 6.3*  ALBUMIN 2.8* 2.7* 2.8* 2.7*   No results for input(s): LIPASE, AMYLASE in the last 168 hours. No results for input(s): AMMONIA in the last 168 hours. Coagulation Profile: No results for input(s): INR, PROTIME in the last 168 hours. Cardiac Enzymes: No results  for input(s): CKTOTAL, CKMB, CKMBINDEX, TROPONINI in the last 168 hours. BNP (last 3 results) No results for input(s): PROBNP in the last 8760 hours. HbA1C: No results for input(s): HGBA1C in the last 72 hours. CBG: Recent Labs  Lab 07/01/20 1215 07/01/20 1709 07/01/20 1949 07/02/20 0846 07/02/20 1153  GLUCAP 273* 352* 269* 160* 282*   Lipid Profile: No results for input(s): CHOL, HDL, LDLCALC, TRIG, CHOLHDL, LDLDIRECT in the last 72 hours. Thyroid Function Tests: No  results for input(s): TSH, T4TOTAL, FREET4, T3FREE, THYROIDAB in the last 72 hours. Anemia Panel: No results for input(s): VITAMINB12, FOLATE, FERRITIN, TIBC, IRON, RETICCTPCT in the last 72 hours. Sepsis Labs: No results for input(s): PROCALCITON, LATICACIDVEN in the last 168 hours.  No results found for this or any previous visit (from the past 240 hour(s)).   Radiology Studies: CT ANGIO CHEST PE W OR WO CONTRAST  Result Date: 07/01/2020 CLINICAL DATA:  80 year old male with concern for pulmonary embolism. EXAM: CT ANGIOGRAPHY CHEST WITH CONTRAST TECHNIQUE: Multidetector CT imaging of the chest was performed using the standard protocol during bolus administration of intravenous contrast. Multiplanar CT image reconstructions and MIPs were obtained to evaluate the vascular anatomy. CONTRAST:  28mL OMNIPAQUE IOHEXOL 350 MG/ML SOLN COMPARISON:  Chest CT dated 04/27/2020. FINDINGS: Evaluation of this exam is limited due to respiratory motion artifact. Cardiovascular: There is no cardiomegaly or pericardial effusion. There is coronary vascular calcification and postsurgical changes of CABG. Moderate calcified and noncalcified atherosclerotic plaque of the thoracic aorta. Focal dilatation of the midthoracic descending aorta measuring up to 3.5 cm similar to prior CT. No aortic dissection. The origins of the great vessels of the aortic arch appear patent as visualized. Evaluation of the pulmonary arteries is limited due to  respiratory motion artifact. No pulmonary artery embolus identified. Mediastinum/Nodes: No hilar or mediastinal adenopathy. The esophagus and the thyroid gland are grossly unremarkable. No mediastinal fluid collection. There is small to moderate pneumomediastinum. Lungs/Pleura: Background of emphysema. Bilateral pulmonary opacities with patchy areas of ground-glass opacity involving the peripheral and subpleural lung most consistent with multifocal pneumonia and suggestive of COVID pneumonia. Clinical correlation and follow-up recommended. There is no pleural effusion pneumothorax. The central airways are patent. Upper Abdomen: Small gallstones.  Scattered colonic diverticula. Musculoskeletal: Degenerative changes of the spine. Median sternotomy wires. No acute osseous pathology. Review of the MIP images confirms the above findings. IMPRESSION: 1. No CT evidence of pulmonary embolism. 2. Small to moderate pneumomediastinum. 3. Multifocal pneumonia and suggestive of COVID pneumonia. Clinical correlation and follow-up recommended. 4. Aortic Atherosclerosis (ICD10-I70.0) and Emphysema (ICD10-J43.9). Electronically Signed   By: Anner Crete M.D.   On: 07/01/2020 18:36   DG CHEST PORT 1 VIEW  Result Date: 07/01/2020 CLINICAL DATA:  Weakness.  Coronavirus infection. EXAM: PORTABLE CHEST 1 VIEW COMPARISON:  06/24/2020 FINDINGS: Previous median sternotomy and CABG. Widespread patchy pulmonary infiltrates persist, similar to the previous study allowing for technical differences. Findings could relate to ongoing pneumonia/ARDS or could be chronic scarring. No lobar consolidation or collapse. No visible effusion. IMPRESSION: No change. Widespread patchy pulmonary infiltrates. Findings could relate to ongoing pneumonia/ARDS or could be chronic scarring. Electronically Signed   By: Nelson Chimes M.D.   On: 07/01/2020 13:21    Scheduled Meds: . amLODipine  10 mg Oral Q2000  . aspirin EC  81 mg Oral Q2000  .  diclofenac Sodium  2 g Topical Q2000  . dronabinol  2.5 mg Oral BID AC  . enoxaparin (LOVENOX) injection  40 mg Subcutaneous Q12H  . feeding supplement (GLUCERNA SHAKE)  237 mL Oral TID BM  . insulin aspart  0-15 Units Subcutaneous TID WC  . insulin aspart  0-5 Units Subcutaneous QHS  . insulin aspart  15 Units Subcutaneous TID WC  . insulin glargine  25 Units Subcutaneous QHS  . ipratropium-albuterol  3 mL Nebulization TID  . methylPREDNISolone (SOLU-MEDROL) injection  40 mg Intravenous Q12H  . multivitamin with minerals  1 tablet Oral Daily  .  nicotine  21 mg Transdermal Daily  . polyethylene glycol  17 g Oral BID  . sodium zirconium cyclosilicate  5 g Oral BID   Continuous Infusions:   LOS: 18 days   Marylu Lund, MD [facility] Pager On Amion  If 7PM-7AM, please contact night-coverage 07/02/2020, 3:22 PM

## 2020-07-02 NOTE — Progress Notes (Signed)
OT Cancellation Note  Patient Details Name: Bradley French MRN: [id number] DOB: [birth year]   Cancelled Treatment:    Reason Eval/Treat Not Completed: Fatigue/lethargy limiting ability to participate;Other (comment) pt asleep upon arrival declining OOB mobility, ADL participation or sitting EOB to eat lunch. Will check back as time allows.   Corinne Ports K., COTA/L Acute Rehabilitation Services [phone]   [name] 07/02/2020, 1:06 PM

## 2020-07-02 NOTE — Progress Notes (Signed)
Inpatient Diabetes Program Recommendations  AACE/ADA: New Consensus Statement on Inpatient Glycemic Control (2015)  Target Ranges:  Prepandial:   less than 140 mg/dL      Peak postprandial:   less than 180 mg/dL (1-2 hours)      Critically ill patients:  140 - 180 mg/dL   Lab Results  Component Value Date   GLUCAP 282 (H) 07/02/2020   HGBA1C 5.9 (H) 04/12/2020    Review of Glycemic Control  Diabetes history:DM2 Outpatient Diabetes medications:metformin 500 mg QAM Current orders for Inpatient glycemic control: Lantus 25 units Novolog 0-15 units tid + hs Novolog 15 units tid meal coverage  Solumedrol 40 mg Q12 hours Glucerna tid between meals  Inpatient Diabetes Program Recommendations:  Could consider further increasing Novolog meal coverage to 18 units TID (assuming patient is consuming >50% of meals)  Thanks, Bronson Curb, MSN, RNC-OB Diabetes Coordinator [phone] (8a-5p)

## 2020-07-02 DEATH — deceased

## 2020-07-03 ENCOUNTER — Inpatient Hospital Stay (HOSPITAL_COMMUNITY): Payer: No Typology Code available for payment source

## 2020-07-03 DIAGNOSIS — U071 COVID-19: Secondary | ICD-10-CM | POA: Diagnosis not present

## 2020-07-03 LAB — GLUCOSE, CAPILLARY
Glucose-Capillary: 85 mg/dL (ref 70–99)
Glucose-Capillary: 168 mg/dL — ABNORMAL HIGH (ref 70–99)
Glucose-Capillary: 153 mg/dL — ABNORMAL HIGH (ref 70–99)
Glucose-Capillary: 168 mg/dL — ABNORMAL HIGH (ref 70–99)
Glucose-Capillary: 191 mg/dL — ABNORMAL HIGH (ref 70–99)

## 2020-07-03 LAB — CBC
HCT: 48.5 % (ref 39.0–52.0)
Hemoglobin: 15.9 g/dL (ref 13.0–17.0)
MCH: 29.8 pg (ref 26.0–34.0)
MCHC: 32.8 g/dL (ref 30.0–36.0)
MCV: 91 fL (ref 80.0–100.0)
Platelets: 163 10*3/uL (ref 150–400)
RBC: 5.33 MIL/uL (ref 4.22–5.81)
RDW: 13.6 % (ref 11.5–15.5)
WBC: 11.8 10*3/uL — ABNORMAL HIGH (ref 4.0–10.5)
nRBC: 0 % (ref 0.0–0.2)

## 2020-07-03 LAB — COMPREHENSIVE METABOLIC PANEL
ALT: 60 U/L — ABNORMAL HIGH (ref 0–44)
AST: 20 U/L (ref 15–41)
Albumin: 2.6 g/dL — ABNORMAL LOW (ref 3.5–5.0)
Alkaline Phosphatase: 53 U/L (ref 38–126)
Anion gap: 9 (ref 5–15)
BUN: 24 mg/dL — ABNORMAL HIGH (ref 8–23)
CO2: 25 mmol/L (ref 22–32)
Calcium: 8.7 mg/dL — ABNORMAL LOW (ref 8.9–10.3)
Chloride: 103 mmol/L (ref 98–111)
Creatinine, Ser: 0.73 mg/dL (ref 0.61–1.24)
GFR, Estimated: >60 mL/min (ref >60)
Glucose, Bld: 93 mg/dL (ref 70–99)
Potassium: 5.2 mmol/L — ABNORMAL HIGH (ref 3.5–5.1)
Sodium: 137 mmol/L (ref 135–145)
Total Bilirubin: 1.3 mg/dL — ABNORMAL HIGH (ref 0.3–1.2)
Total Protein: 5.3 g/dL — ABNORMAL LOW (ref 6.5–8.1)

## 2020-07-03 MED ORDER — INSULIN ASPART 100 UNIT/ML ~~LOC~~ SOLN
5.0000 [IU] | Freq: Three times a day (TID) | SUBCUTANEOUS | Status: DC
  Administered 2020-07-03 – 2020-07-09 (×12): 5 [IU] via SUBCUTANEOUS

## 2020-07-03 MED ORDER — PROSOURCE PLUS PO LIQD
30.0000 mL | Freq: Three times a day (TID) | ORAL | Status: DC
  Administered 2020-07-03 – 2020-07-10 (×10): 30 mL via ORAL
  Filled 2020-07-03 (×23): qty 30

## 2020-07-03 MED ORDER — INSULIN GLARGINE 100 UNIT/ML ~~LOC~~ SOLN
10.0000 [IU] | Freq: Every day | SUBCUTANEOUS | Status: DC
  Administered 2020-07-03 – 2020-07-09 (×7): 10 [IU] via SUBCUTANEOUS
  Filled 2020-07-03 (×8): qty 0.1

## 2020-07-03 MED ORDER — GLUCERNA SHAKE PO LIQD
237.0000 mL | Freq: Four times a day (QID) | ORAL | Status: DC
  Administered 2020-07-03 – 2020-07-10 (×21): 237 mL via ORAL
  Filled 2020-07-03: qty 237

## 2020-07-03 NOTE — Plan of Care (Signed)
  Problem: Respiratory: Goal: Will maintain a patent airway Outcome: Progressing   Problem: Elimination: Goal: Will not experience complications related to bowel motility Outcome: Progressing   Problem: Elimination: Goal: Will not experience complications related to urinary retention Outcome: Progressing   Problem: Safety: Goal: Ability to remain free from injury will improve Outcome: Progressing

## 2020-07-03 NOTE — Progress Notes (Signed)
NAME:  Bradley French, MRN:  [id number], DOB:  1941-03-08, LOS: 18 ADMISSION DATE:  07-09-20, CONSULTATION DATE:  2/1 REFERRING MD:  Wyline Copas, CHIEF COMPLAINT:  Dyspnea   Brief History:  80 y/o male vaccinated against COVID (no booster) admitted with COVID Pneumonia on 1/14, has been treated with baricitinib, steroids, but PCCM was consulted on hospital day 18 for persistent hypoxemia.  Started on antibiotics by PCCM for HCAP on 2/1.  Past Medical History:  Tobacco abuse, OSA on CPAP, reported COPD, CAD s/p CABG 2006, DMT2  COVID vaccinated x 2  Significant Hospital Events:  1/14 admitted [facility]  Consults:  Pulmonary 2/1  Procedures:    Significant Diagnostic Tests:  2/1 CTA PE >> 1. No CT evidence of pulmonary embolism. 2. Small to moderate pneumomediastinum. 3. Multifocal pneumonia and suggestive of COVID pneumonia. Clinical correlation and follow-up recommended. 4. Aortic Atherosclerosis and Emphysema    Micro Data:  1/10 SARS positive   Antimicrobials:  1/14 remdesivir >> 1/19 1/14 baricitinib >> 1/28 1/14 solumedrol >> 2/1 vanc > 2/1 cefepime >   Interim History / Subjective:  Feels better today Still has cough, chest congestion  Objective   Blood pressure 110/71, pulse 79, temperature 97.6 F (36.4 C), resp. rate (!) 21, height 6\' 4"  (1.93 m), weight 108.9 kg, SpO2 96 %.        Intake/Output Summary (Last 24 hours) at 07/03/2020 1033 Last data filed at 07/03/2020 0609 Gross per 24 hour  Intake --  Output 725 ml  Net -725 ml   Filed Weights   July 09, 2020 1039  Weight: 108.9 kg    Examination: General:  Resting comfortably in bed HENT: NCAT OP clear PULM: Crackles 1/2 way up bilateraly, normal effort CV: RRR, no mgr GI: BS+, soft, nontender MSK: normal bulk and tone Neuro: awake, alert, no distress, MAEW   Resolved Hospital Problem list     Assessment & Plan:  COVID 19 pneumonia: resolved, now dealing with persistent hypoxemia from lung injury from  [place] off steroids  Acute respiratory failure with hypoxemia Wean off O2 to maintain O2 saturation > 88%  HCAP Vanc/cefepime  mucinex Out of bed, flutter, incentive spirometry  Continue to monitor  Best practice (evaluated daily)   Per [facility]  Goals of Care:   Per [facility]  Labs   CBC: Recent Labs  Lab 06/29/20 0216 07/01/20 0056 07/02/20 0226 07/03/20 0359  WBC 13.4* 13.7* 12.7* 11.8*  HGB 16.5 16.8 16.3 15.9  HCT 48.3 47.4 46.0 48.5  MCV 88.0 87.6 87.6 91.0  PLT 304 253 229 315    Basic Metabolic Panel: Recent Labs  Lab 06/30/20 1034 06/30/20 1550 06/30/20 2016 07/01/20 0056 07/02/20 0226 07/03/20 0359  NA 133* 135  --  137 135 137  K 6.8* 6.8* 5.8* 5.4* 5.7* 5.2*  CL 99 99  --  100 102 103  CO2 23 23  --  26 25 25   GLUCOSE 331* 273*  --  204* 143* 93  BUN 31* 33*  --  26* 26* 24*  CREATININE 1.12 1.07  --  0.94 0.77 0.73  CALCIUM 9.0 9.0  --  9.3 8.9 8.7*   GFR: Estimated Creatinine Clearance: 102.9 mL/min (by C-G formula based on SCr of 0.73 mg/dL). Recent Labs  Lab 06/29/20 0216 07/01/20 0056 07/02/20 0226 07/03/20 0359  WBC 13.4* 13.7* 12.7* 11.8*    Liver Function Tests: Recent Labs  Lab 06/29/20 0216 07/01/20 0056 07/02/20 0226 07/03/20 0359  AST 18 17  20 20  ALT 54* 55* 62* 60*  ALKPHOS 62 61 61 53  BILITOT 1.2 0.9 1.2 1.3*  PROT 5.8* 5.8* 6.3* 5.3*  ALBUMIN 2.7* 2.8* 2.7* 2.6*   No results for input(s): LIPASE, AMYLASE in the last 168 hours. No results for input(s): AMMONIA in the last 168 hours.  ABG No results found for: PHART, PCO2ART, PO2ART, HCO3, TCO2, ACIDBASEDEF, O2SAT   Coagulation Profile: No results for input(s): INR, PROTIME in the last 168 hours.  Cardiac Enzymes: No results for input(s): CKTOTAL, CKMB, CKMBINDEX, TROPONINI in the last 168 hours.  HbA1C: Hgb A1c MFr Bld  Date/Time Value Ref Range Status  04/12/2020 11:30 AM 5.9 (H) 4.8 - 5.6 % Final    Comment:    (NOTE) Pre diabetes:           5.7%-6.4%  Diabetes:              >6.4%  Glycemic control for   <7.0% adults with diabetes   10/04/2013 01:35 AM 6.4 (H) <5.7 % Final    Comment:    (NOTE)                                                                       According to the ADA Clinical Practice Recommendations for 2011, when HbA1c is used as a screening test:  >=6.5%   Diagnostic of Diabetes Mellitus           (if abnormal result is confirmed) 5.7-6.4%   Increased risk of developing Diabetes Mellitus References:Diagnosis and Classification of Diabetes Mellitus,Diabetes GURK,2706,23(JSEGB 1):S62-S69 and Standards of Medical Care in         Diabetes - 2011,Diabetes Care,2011,34 (Suppl 1):S11-S61.    CBG: Recent Labs  Lab 07/02/20 0846 07/02/20 1153 07/02/20 1636 07/02/20 2156 07/03/20 0734  GLUCAP 160* 282* 143* 239* 85      Critical care time: n/a    Roselie Awkward, MD [facility] Pager: [phone] Cell: [phone] If no response, call [phone]

## 2020-07-03 NOTE — Progress Notes (Signed)
   NAME:  Bradley French, MRN:  [id number], DOB:  06-09-1940, LOS: 33 ADMISSION DATE:  06/12/2020, CONSULTATION DATE:  07/02/2020 REFERRING MD:  Dr. Wyline Copas, CHIEF COMPLAINT:  COVID pneumonia   Brief History:  80 year old male admitted with COVID pneumonia, since unable to wean off salter HFNC.  Pulmonary consulted for further recommendations.   Past Medical History:  Tobacco abuse, OSA on CPAP, reported COPD, CAD s/p CABG 2006, DMT2  COVID vaccinated x 2  Significant Hospital Events:  1/14 admitted [place]  Consults:  Pulmonary 2/1  Procedures:   Significant Diagnostic Tests:  2/1 CTA PE >> 1. No CT evidence of pulmonary embolism. 2. Small to moderate pneumomediastinum. 3. Multifocal pneumonia and suggestive of COVID pneumonia. Clinical correlation and follow-up recommended. 4. Aortic Atherosclerosis and Emphysema  Esophagram completed on 2/2 There was poor esophageal motility noted 13 mm barium tablet would not pass beyond distal esophagitis felt secondary to significant dysmotility Micro Data:  1/10 SARS positive   Antimicrobials:  1/14 remdesivir >> 1/19 1/14 baricitinib >> 1/28 1/14 solumedrol >>off  Interim History / Subjective:  Says he feels a little better still has significant fairly persistent nonproductive cough  Objective   Blood pressure 110/71, pulse 79, temperature 97.6 F (36.4 C), resp. rate (Abnormal) 21, height 6\' 4"  (1.93 m), weight 108.9 kg, SpO2 96 %.        Intake/Output Summary (Last 24 hours) at 07/03/2020 1011 Last data filed at 07/03/2020 0609 Gross per 24 hour  Intake no documentation  Output 725 ml  Net -725 ml   Filed Weights   06/28/2020 1039  Weight: 108.9 kg   Examination:  General 80 year old male patient lying in bed he is currently in no acute distress HEENT normocephalic atraumatic no jugular venous distention is appreciated Pulmonary: Diminished bases.  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  Looking at his esophagram I wonder if his pneumonia was a consequence of aspiration given poor esophageal motility, often times persistent hypoxia mixed with esophageal dysfunction creates high risk of aspiration Plan Currently day #3 vancomycin and cefepime, will check MRSA PCR, if negative discontinue vancomycin Reflux precautions Add pantoprazole      Best practice (evaluated daily)  Diet: per primary  Pain/Anxiety/Delirium protocol (if indicated): tylenol prn  VAP protocol (if indicated): n/a DVT prophylaxis: lovenox GI prophylaxis: n/a Glucose control: per primary  Mobility: PT Disposition: PCU     Erick Colace ACNP-BC [place] Pager # [phone] OR # [phone] if no answer

## 2020-07-03 NOTE — Plan of Care (Signed)
  Problem: Nutrition: Goal: Adequate nutrition will be maintained Outcome: Progressing   Problem: Elimination: Goal: Will not experience complications related to urinary retention Outcome: Progressing   Problem: Pain Managment: Goal: General experience of comfort will improve Outcome: Progressing   Problem: Safety: Goal: Ability to remain free from injury will improve Outcome: Progressing   Problem: Clinical Measurements: Goal: Respiratory complications will improve Outcome: Not Progressing  Pt O2 demand continues to increase

## 2020-07-03 NOTE — Progress Notes (Addendum)
Nutrition Follow Up  DOCUMENTATION CODES:   Not applicable  INTERVENTION:   Recommend Cortrak placement given ongoing poor intake x 19 days. Patient would like to try increased daily supplementation first.    Glucerna Shake po QID, each supplement provides 220 kcal and 10 grams of protein  30 ml ProSource Plus TID, each supplement provides 100 kcals and 15 grams protein.   Magic cup TID with meals, each supplement provides 290 kcal and 9 grams of protein  MVI daily   NUTRITION DIAGNOSIS:   Increased nutrient needs related to acute illness (COVID PNA) as evidenced by estimated needs.  Ongoing  GOAL:   Patient will meet greater than or equal to 90% of their needs   Not meeting   MONITOR:   PO intake,Supplement acceptance,Weight trends,Labs,I & O's  REASON FOR ASSESSMENT:   LOS    ASSESSMENT:   Patient with PMH significant for CAD s/p CABG, HTN, DM, and COPD. Presents this admission with COVID PNA.   Patient has not shown progression with PO intake. States he is finishing around 0-25% of each meal. He is taking Glucerna 2-3 times daily and enjoys the taste better than Ensure. Discussed increased need for kcal/protein given COVID PNA. Recommended patient have Cortrak placed given poor PO intake x 19 days. Patient would like to try to increased supplementation to avoid this.   Admission weight: 108.9 kg (stated)  Medications: marinol, SS novolog, lantus, miralax, lokelma Labs: K 5.2 (H) CBG 85-282  Diet Order:   Diet Order            Diet regular Room service appropriate? Yes; Fluid consistency: Thin  Diet effective now                 EDUCATION NEEDS:   Education needs have been addressed  Skin:  Skin Assessment: Reviewed RN Assessment  Last BM:  2/1  Height:   Ht Readings from Last 1 Encounters:  06-17-2020 6\' 4"  (1.93 m)    Weight:   Wt Readings from Last 1 Encounters:  06-17-2020 108.9 kg    BMI:  Body mass index is 29.21 kg/m.  Estimated  Nutritional Needs:   Kcal:  2500-2700 kcal  Protein:  125-145 grams  Fluid:  >/= 2 L/day  Mariana Single RD, LDN Clinical Nutrition Pager listed in [place]

## 2020-07-03 NOTE — Progress Notes (Signed)
PROGRESS NOTE  Bradley French WUJ:[id number] DOB: 12-20-1940 DOA: 06-24-2020 PCP: [facility]   LOS: 19 days   Brief Narrative / Interim history:  80 y.o.malewith medical history significant ofCAD s/p CABG in 2006, HTN, IIDM, OSA on CPAP,COPD, presented with cough and increasing short of breath. Patient started to have symptoms of dry cough and shortness of breath 6 days ago, and he went to possible COVID-19 5 days ago. He also had subjective fever and chills, loss of appetite but no abdominal pain or diarrhea. He has not been eating or drinking much for the last 3 to 4 days. He was vaccinated for COVID x 2. Went to see his PCP who checked his pulse ox in the office and found low and told him to come to ED. COVID-positive with chest x-ray showed bilateral multifocal infiltrates.88% on room air, stabilized on 3L 95%.  Subjective / 24h Interval events: Denies any significant shortness of breath, no chest pain.  Overall feels better  Assessment & Plan: Principal Problem Acute hypoxic respiratory failure due to COVID-19 pneumonia, pneumomediastinum, concern for HCAP-has been treated with baricitinib, Remdesivir, steroids.  Now has persistent hypoxemia. -Pulmonary following, appreciate input -Remains on high flow, 10 L.  Currently off isolation -CT angiogram with findings of small to moderate pneumomediastinum  -Currently on Vanco cefepime  Active Problems Type 2 diabetes mellitus-continue Lantus, NovoLog.  Now off steroids decrease doses to avoid hypoglycemia  Deconditioning, ambulatory dysfunction, POA -PT recommends SNF, placement pending  Essential hypertension -Continue home regimen, blood pressure stable  Scheduled Meds: . amLODipine  10 mg Oral Q2000  . aspirin EC  81 mg Oral Q2000  . diclofenac Sodium  2 g Topical Q2000  . dronabinol  2.5 mg Oral BID AC  . enoxaparin (LOVENOX) injection  40 mg Subcutaneous Q12H  . feeding supplement (GLUCERNA SHAKE)  237  mL Oral TID BM  . insulin aspart  0-15 Units Subcutaneous TID WC  . insulin aspart  0-5 Units Subcutaneous QHS  . insulin aspart  5 Units Subcutaneous TID WC  . insulin glargine  10 Units Subcutaneous QHS  . Ipratropium-Albuterol  1 puff Inhalation Q6H  . mometasone-formoterol  2 puff Inhalation BID  . multivitamin with minerals  1 tablet Oral Daily  . nicotine  21 mg Transdermal Daily  . polyethylene glycol  17 g Oral BID  . sodium zirconium cyclosilicate  5 g Oral BID   Continuous Infusions: . ceFEPime (MAXIPIME) IV 2 g (07/03/20 1000)  . vancomycin 1,500 mg (07/03/20 0646)   PRN Meds:.acetaminophen, albuterol, guaiFENesin-dextromethorphan, ondansetron **OR** ondansetron (ZOFRAN) IV  Diet Orders (From admission, onward)    Start     Ordered   06/27/20 1225  Diet regular Room service appropriate? Yes; Fluid consistency: Thin  Diet effective now       Question Answer Comment  Room service appropriate? Yes   Fluid consistency: Thin      06/27/20 1224          DVT prophylaxis: enoxaparin (LOVENOX) injection 40 mg Start: 06/21/20 1100     Code Status: Full Code  Family Communication: Discussed with patient  Status is: Inpatient  Remains inpatient appropriate because:Inpatient level of care appropriate due to severity of illness   Dispo: The patient is from: Home              Anticipated d/c is to: SNF              Anticipated d/c date is: 2 days  Patient currently is not medically stable to d/c.   Difficult to place patient No  Level of care: Telemetry Medical  Consultants:  Pulmonary   Procedures:  none  Microbiology  none  Antimicrobials: Vancomycin / Cefepime 2/1 >>   Objective: Vitals:   07/02/20 2134 07/02/20 2154 07/03/20 0608 07/03/20 0844  BP:  125/68 110/71   Pulse:  89 79   Resp:  20 (!) 21   Temp:  98.6 F (37 C) 97.6 F (36.4 C)   TempSrc:      SpO2: 94%  99% 96%  Weight:      Height:        Intake/Output Summary  (Last 24 hours) at 07/03/2020 1127 Last data filed at 07/03/2020 2330 Gross per 24 hour  Intake --  Output 725 ml  Net -725 ml   Filed Weights   06/19/2020 1039  Weight: 108.9 kg    Examination:  Constitutional: NAD Eyes: no scleral icterus ENMT: Mucous membranes are moist.  Neck: normal, supple Respiratory: Bibasilar rhonchi, no wheezing, no crackles Cardiovascular: Regular rate and rhythm, no murmurs / rubs / gallops. No LE edema. Abdomen: non distended, no tenderness. Bowel sounds positive.  Musculoskeletal: no clubbing / cyanosis.  Skin: no rashes Neurologic: CN 2-12 grossly intact. Strength 5/5 in all 4.   Data Reviewed: I have independently reviewed following labs and imaging studies   CBC: Recent Labs  Lab 06/29/20 0216 07/01/20 0056 07/02/20 0226 07/03/20 0359  WBC 13.4* 13.7* 12.7* 11.8*  HGB 16.5 16.8 16.3 15.9  HCT 48.3 47.4 46.0 48.5  MCV 88.0 87.6 87.6 91.0  PLT 304 253 229 076   Basic Metabolic Panel: Recent Labs  Lab 06/30/20 1034 06/30/20 1550 06/30/20 2016 07/01/20 0056 07/02/20 0226 07/03/20 0359  NA 133* 135  --  137 135 137  K 6.8* 6.8* 5.8* 5.4* 5.7* 5.2*  CL 99 99  --  100 102 103  CO2 23 23  --  26 25 25   GLUCOSE 331* 273*  --  204* 143* 93  BUN 31* 33*  --  26* 26* 24*  CREATININE 1.12 1.07  --  0.94 0.77 0.73  CALCIUM 9.0 9.0  --  9.3 8.9 8.7*   Liver Function Tests: Recent Labs  Lab 06/29/20 0216 07/01/20 0056 07/02/20 0226 07/03/20 0359  AST 18 17 20 20   ALT 54* 55* 62* 60*  ALKPHOS 62 61 61 53  BILITOT 1.2 0.9 1.2 1.3*  PROT 5.8* 5.8* 6.3* 5.3*  ALBUMIN 2.7* 2.8* 2.7* 2.6*   Coagulation Profile: No results for input(s): INR, PROTIME in the last 168 hours. HbA1C: No results for input(s): HGBA1C in the last 72 hours. CBG: Recent Labs  Lab 07/02/20 1153 07/02/20 1636 07/02/20 2156 07/03/20 0734 07/03/20 1121  GLUCAP 282* 143* 239* 85 168*    No results found for this or any previous visit (from the past 240  hour(s)).   Radiology Studies: DG ESOPHAGUS W SINGLE CM (SOL OR THIN BA)  Result Date: 07/03/2020 CLINICAL DATA:  Chronic cough, possible aspiration. EXAM: ESOPHOGRAM / BARIUM SWALLOW / BARIUM TABLET STUDY TECHNIQUE: Combined double contrast and single contrast examination performed using effervescent crystals, thick barium liquid, and thin barium liquid. The patient was observed with fluoroscopy swallowing a 13 mm barium sulphate tablet. FLUOROSCOPY TIME:  Fluoroscopy Time:  1 minutes 54 seconds. Radiation Exposure Index (if provided by the fluoroscopic device): Number of Acquired Spot Images: 0 COMPARISON:  CT chest 07/01/2020. FINDINGS: A limited examination was  performed in the recumbent LPO position. Patient drank thin barium. Poor esophageal motility with tertiary contractions. No esophageal stricture. A 13 mm barium tablet would not pass beyond the distal esophagus, despite lack of a stricture, likely due to dysmotility. IMPRESSION: 13 mm barium tablet would not pass beyond the distal esophagus, likely due to dysmotility. No evidence of a stricture. Electronically Signed   By: Lorin Picket M.D.   On: 07/03/2020 09:52    Marzetta Board, MD, PhD [facility]  Between 7 am - 7 pm I am available, please contact me via Amion or Securechat  Between 7 pm - 7 am I am not available, please contact night coverage MD/APP via Amion

## 2020-07-04 ENCOUNTER — Encounter (HOSPITAL_COMMUNITY): Payer: Self-pay | Admitting: Internal Medicine

## 2020-07-04 DIAGNOSIS — J9601 Acute respiratory failure with hypoxia: Secondary | ICD-10-CM | POA: Diagnosis not present

## 2020-07-04 DIAGNOSIS — U071 COVID-19: Secondary | ICD-10-CM | POA: Diagnosis not present

## 2020-07-04 LAB — BASIC METABOLIC PANEL
Anion gap: 9 (ref 5–15)
BUN: 19 mg/dL (ref 8–23)
CO2: 23 mmol/L (ref 22–32)
Calcium: 8.7 mg/dL — ABNORMAL LOW (ref 8.9–10.3)
Chloride: 104 mmol/L (ref 98–111)
Creatinine, Ser: 0.65 mg/dL (ref 0.61–1.24)
GFR, Estimated: >60 mL/min (ref >60)
Glucose, Bld: 97 mg/dL (ref 70–99)
Potassium: 4.4 mmol/L (ref 3.5–5.1)
Sodium: 136 mmol/L (ref 135–145)

## 2020-07-04 LAB — CBC
HCT: 45.8 % (ref 39.0–52.0)
Hemoglobin: 15.2 g/dL (ref 13.0–17.0)
MCH: 29.7 pg (ref 26.0–34.0)
MCHC: 33.2 g/dL (ref 30.0–36.0)
MCV: 89.6 fL (ref 80.0–100.0)
Platelets: 157 10*3/uL (ref 150–400)
RBC: 5.11 MIL/uL (ref 4.22–5.81)
RDW: 13.5 % (ref 11.5–15.5)
WBC: 11.5 10*3/uL — ABNORMAL HIGH (ref 4.0–10.5)
nRBC: 0 % (ref 0.0–0.2)

## 2020-07-04 LAB — GLUCOSE, CAPILLARY
Glucose-Capillary: 85 mg/dL (ref 70–99)
Glucose-Capillary: 105 mg/dL — ABNORMAL HIGH (ref 70–99)
Glucose-Capillary: 220 mg/dL — ABNORMAL HIGH (ref 70–99)
Glucose-Capillary: 223 mg/dL — ABNORMAL HIGH (ref 70–99)

## 2020-07-04 NOTE — Progress Notes (Signed)
PT Cancellation Note  Patient Details Name: PURCELL JUNGBLUTH MRN: [id number] DOB: 06-26-1940   Cancelled Treatment:      Pt declined PT.  Attempted to encourage/educate but he refused all activity.  He had attempted getting OOB earlier with nursing staff and had a drop in O2 with difficult recovery.  Will f/u as able.  Abran Richard, PT Acute Rehab Services Pager [phone] [facility] [place] 07/04/2020, 4:54 PM

## 2020-07-04 NOTE — Progress Notes (Signed)
Per request of MD, while readying patient to ambulate, stood him up bedside.  O2 immediately desaturated to High 70's.  Patient did not rebound, tech assisted patient back to bed.  O2 sat rebounded to upper 80's, but only while resting in bed.  Have been unable to titrate oxygen down.

## 2020-07-04 NOTE — Plan of Care (Signed)
  Problem: Respiratory: Goal: Will maintain a patent airway Outcome: Progressing   Problem: Clinical Measurements: Goal: Cardiovascular complication will be avoided Outcome: Progressing   Problem: Nutrition: Goal: Adequate nutrition will be maintained Outcome: Progressing   Problem: Clinical Measurements: Goal: Respiratory complications will improve Outcome: Not Progressing

## 2020-07-04 NOTE — Progress Notes (Signed)
PROGRESS NOTE  DARCY CORDNER WUJ:[id number] DOB: [birth year] DOA: 06/22/2020 PCP: [facility]   LOS: 20 days   Brief Narrative / Interim history:  80 y.o.malewith medical history significant ofCAD s/p CABG in 2006, HTN, IIDM, OSA on CPAP,COPD, presented with cough and increasing short of breath. Patient started to have symptoms of dry cough and shortness of breath 6 days ago, and he went to possible COVID-19 5 days ago. He also had subjective fever and chills, loss of appetite but no abdominal pain or diarrhea. He has not been eating or drinking much for the last 3 to 4 days. He was vaccinated for COVID x 2. Went to see his PCP who checked his pulse ox in the office and found low and told him to come to ED. COVID-positive with chest x-ray showed bilateral multifocal infiltrates  Subjective / 24h Interval events: Overall feels about the same.  Denies any significant shortness of breath.  No chest pain  Assessment & Plan: Principal Problem Acute hypoxic respiratory failure due to COVID-19 pneumonia, pneumomediastinum, concern for HCAP-has been treated with baricitinib, Remdesivir, steroids.  Now has persistent hypoxemia. -Pulmonary following, appreciate input -Remains on high flow, 13 L currently.  Currently off isolation -CT angiogram with findings of small to moderate pneumomediastinum  -Currently on Vanco cefepime started on 2/1, today day #3, possibly 5-7 days  Active Problems Type 2 diabetes mellitus-continue Lantus, NovoLog.  Now off steroids decrease doses to avoid hypoglycemia  CBG (last 3)  Recent Labs    07/03/20 2050 07/03/20 2128 07/04/20 0846  GLUCAP 191* 168* 5    Deconditioning, ambulatory dysfunction, POA -PT recommends SNF, placement pending  Essential hypertension -Continue home regimen, blood pressure stable  Scheduled Meds: . (feeding supplement) PROSource Plus  30 mL Oral TID BM  . amLODipine  10 mg Oral Q2000  . aspirin EC  81 mg  Oral Q2000  . diclofenac Sodium  2 g Topical Q2000  . dronabinol  2.5 mg Oral BID AC  . enoxaparin (LOVENOX) injection  40 mg Subcutaneous Q12H  . feeding supplement (GLUCERNA SHAKE)  237 mL Oral QID  . insulin aspart  0-15 Units Subcutaneous TID WC  . insulin aspart  0-5 Units Subcutaneous QHS  . insulin aspart  5 Units Subcutaneous TID WC  . insulin glargine  10 Units Subcutaneous QHS  . Ipratropium-Albuterol  1 puff Inhalation Q6H  . mometasone-formoterol  2 puff Inhalation BID  . multivitamin with minerals  1 tablet Oral Daily  . nicotine  21 mg Transdermal Daily  . polyethylene glycol  17 g Oral BID   Continuous Infusions: . ceFEPime (MAXIPIME) IV 2 g (07/04/20 1014)  . vancomycin 1,500 mg (07/04/20 0617)   PRN Meds:.acetaminophen, albuterol, guaiFENesin-dextromethorphan, ondansetron **OR** ondansetron (ZOFRAN) IV  Diet Orders (From admission, onward)    Start     Ordered   06/27/20 1225  Diet regular Room service appropriate? Yes; Fluid consistency: Thin  Diet effective now       Question Answer Comment  Room service appropriate? Yes   Fluid consistency: Thin      06/27/20 1224          DVT prophylaxis: enoxaparin (LOVENOX) injection 40 mg Start: 06/21/20 1100     Code Status: Full Code  Family Communication: Discussed with patient, wife  Status is: Inpatient  Remains inpatient appropriate because:Inpatient level of care appropriate due to severity of illness   Dispo: The patient is from: Home  Anticipated d/c is to: SNF              Anticipated d/c date is: 2 days              Patient currently is not medically stable to d/c.   Difficult to place patient No  Level of care: Telemetry Medical  Consultants:  Pulmonary   Procedures:  none  Microbiology  none  Antimicrobials: Vancomycin / Cefepime 2/1 >>   Objective: Vitals:   07/04/20 0618 07/04/20 0620 07/04/20 0754 07/04/20 0845  BP:      Pulse:      Resp:      Temp:       TempSrc:      SpO2: (!) 87% 91% 94% 93%  Weight:      Height:        Intake/Output Summary (Last 24 hours) at 07/04/2020 1115 Last data filed at 07/04/2020 0617 Gross per 24 hour  Intake 100 ml  Output 800 ml  Net -700 ml   Filed Weights   06/03/2020 1039  Weight: 108.9 kg    Examination:  Constitutional: No distress Eyes: No icterus ENMT: mmm Neck: normal, supple Respiratory: Bibasilar rhonchi, no wheezing, no crackles Cardiovascular: Regular rate and rhythm, no murmurs, no peripheral edema Abdomen: Soft, nontender, nondistended, bowel sounds positive Musculoskeletal: no clubbing / cyanosis.  Skin: No rashes seen Neurologic: Nonfocal  Data Reviewed: I have independently reviewed following labs and imaging studies   CBC: Recent Labs  Lab 06/29/20 0216 07/01/20 0056 07/02/20 0226 07/03/20 0359 07/04/20 0253  WBC 13.4* 13.7* 12.7* 11.8* 11.5*  HGB 16.5 16.8 16.3 15.9 15.2  HCT 48.3 47.4 46.0 48.5 45.8  MCV 88.0 87.6 87.6 91.0 89.6  PLT 304 253 229 163 811   Basic Metabolic Panel: Recent Labs  Lab 06/30/20 1550 06/30/20 2016 07/01/20 0056 07/02/20 0226 07/03/20 0359 07/04/20 0253  NA 135  --  137 135 137 136  K 6.8* 5.8* 5.4* 5.7* 5.2* 4.4  CL 99  --  100 102 103 104  CO2 23  --  26 25 25 23   GLUCOSE 273*  --  204* 143* 93 97  BUN 33*  --  26* 26* 24* 19  CREATININE 1.07  --  0.94 0.77 0.73 0.65  CALCIUM 9.0  --  9.3 8.9 8.7* 8.7*   Liver Function Tests: Recent Labs  Lab 06/29/20 0216 07/01/20 0056 07/02/20 0226 07/03/20 0359  AST 18 17 20 20   ALT 54* 55* 62* 60*  ALKPHOS 62 61 61 53  BILITOT 1.2 0.9 1.2 1.3*  PROT 5.8* 5.8* 6.3* 5.3*  ALBUMIN 2.7* 2.8* 2.7* 2.6*   Coagulation Profile: No results for input(s): INR, PROTIME in the last 168 hours. HbA1C: No results for input(s): HGBA1C in the last 72 hours. CBG: Recent Labs  Lab 07/03/20 1121 07/03/20 1627 07/03/20 2050 07/03/20 2128 07/04/20 0846  GLUCAP 168* 153* 191* 168* 85     No results found for this or any previous visit (from the past 240 hour(s)).   Radiology Studies: No results found.  Marzetta Board, MD, PhD [facility]  Between 7 am - 7 pm I am available, please contact me via Amion or Securechat  Between 7 pm - 7 am I am not available, please contact night coverage MD/APP via Amion

## 2020-07-05 DIAGNOSIS — U071 COVID-19: Secondary | ICD-10-CM | POA: Diagnosis not present

## 2020-07-05 DIAGNOSIS — J9601 Acute respiratory failure with hypoxia: Secondary | ICD-10-CM | POA: Diagnosis not present

## 2020-07-05 LAB — GLUCOSE, CAPILLARY
Glucose-Capillary: 132 mg/dL — ABNORMAL HIGH (ref 70–99)
Glucose-Capillary: 184 mg/dL — ABNORMAL HIGH (ref 70–99)
Glucose-Capillary: 148 mg/dL — ABNORMAL HIGH (ref 70–99)

## 2020-07-05 NOTE — Progress Notes (Signed)
PROGRESS NOTE  Bradley French HER:[id number] DOB: 21-May-1941 DOA: 06-27-2020 PCP: [facility]   LOS: 21 days   Brief Narrative / Interim history:  80 y.o.malewith medical history significant ofCAD s/p CABG in 2006, HTN, IIDM, OSA on CPAP,COPD, presented with cough and increasing short of breath. Patient started to have symptoms of dry cough and shortness of breath 6 days ago, and he went to possible COVID-19 5 days ago. He also had subjective fever and chills, loss of appetite but no abdominal pain or diarrhea. He has not been eating or drinking much for the last 3 to 4 days. He was vaccinated for COVID x 2. Went to see his PCP who checked his pulse ox in the office and found low and told him to come to ED. COVID-positive with chest x-ray showed bilateral multifocal infiltrates  Subjective / 24h Interval events: No significant complaints.  Denies any significant shortness of breath.  Assessment & Plan: Principal Problem Acute hypoxic respiratory failure due to COVID-19 pneumonia, pneumomediastinum, concern for HCAP-has been treated with baricitinib, Remdesivir, steroids.  Now has persistent hypoxemia. -Pulmonary following, appreciate input -Remains on high flow, 13 L currently.  Currently off isolation -CT angiogram with findings of small to moderate pneumomediastinum  -Currently on Vanco cefepime started on 2/1, off Vanco 2/3, continue cefepime  Active Problems Type 2 diabetes mellitus-continue Lantus, NovoLog.  Now off steroids, decrease doses to avoid hypoglycemia.  CBGs acceptable  CBG (last 3)  Recent Labs    07/04/20 2133 07/05/20 0720 07/05/20 1154  GLUCAP 223* 132* 184*    Deconditioning, ambulatory dysfunction, POA -PT recommends SNF, placement pending  Essential hypertension -Continue home regimen, blood pressure stable  Scheduled Meds: . (feeding supplement) PROSource Plus  30 mL Oral TID BM  . amLODipine  10 mg Oral Q2000  . aspirin EC  81  mg Oral Q2000  . diclofenac Sodium  2 g Topical Q2000  . dronabinol  2.5 mg Oral BID AC  . enoxaparin (LOVENOX) injection  40 mg Subcutaneous Q12H  . feeding supplement (GLUCERNA SHAKE)  237 mL Oral QID  . insulin aspart  0-15 Units Subcutaneous TID WC  . insulin aspart  0-5 Units Subcutaneous QHS  . insulin aspart  5 Units Subcutaneous TID WC  . insulin glargine  10 Units Subcutaneous QHS  . Ipratropium-Albuterol  1 puff Inhalation Q6H  . mometasone-formoterol  2 puff Inhalation BID  . multivitamin with minerals  1 tablet Oral Daily  . nicotine  21 mg Transdermal Daily  . polyethylene glycol  17 g Oral BID   Continuous Infusions: . ceFEPime (MAXIPIME) IV 2 g (07/05/20 0944)   PRN Meds:.acetaminophen, albuterol, guaiFENesin-dextromethorphan, ondansetron **OR** ondansetron (ZOFRAN) IV  Diet Orders (From admission, onward)    Start     Ordered   06/27/20 1225  Diet regular Room service appropriate? Yes; Fluid consistency: Thin  Diet effective now       Question Answer Comment  Room service appropriate? Yes   Fluid consistency: Thin      06/27/20 1224          DVT prophylaxis: enoxaparin (LOVENOX) injection 40 mg Start: 06/21/20 1100     Code Status: Full Code  Family Communication: Discussed with patient  Status is: Inpatient  Remains inpatient appropriate because:Inpatient level of care appropriate due to severity of illness   Dispo: The patient is from: Home              Anticipated d/c is to: SNF  Anticipated d/c date is: 2 days              Patient currently is not medically stable to d/c.   Difficult to place patient No  Level of care: Telemetry Medical  Consultants:  Pulmonary   Procedures:  none  Microbiology  none  Antimicrobials: Vancomycin / Cefepime 2/1 >>   Objective: Vitals:   07/05/20 0253 07/05/20 0526 07/05/20 0803 07/05/20 1155  BP:  109/63  (!) 99/59  Pulse:  96  98  Resp:  20  20  Temp:  99.4 F (37.4 C)  98.6 F  (37 C)  TempSrc:      SpO2: 93% 94% 91% 91%  Weight:      Height:        Intake/Output Summary (Last 24 hours) at 07/05/2020 1220 Last data filed at 07/05/2020 1006 Gross per 24 hour  Intake 240 ml  Output 500 ml  Net -260 ml   Filed Weights   06/15/2020 1039  Weight: 108.9 kg    Examination:  Constitutional: NAD Eyes: No icterus ENMT: mmm Neck: normal, supple Respiratory: Bibasilar rhonchi, no wheezing, no crackles Cardiovascular: Regular rate and rhythm, no murmurs, no edema Abdomen: Soft, NT, ND, bowel sounds positive Musculoskeletal: no clubbing / cyanosis.  Skin: No rashes seen Neurologic: No focal deficits  Data Reviewed: I have independently reviewed following labs and imaging studies   CBC: Recent Labs  Lab 06/29/20 0216 07/01/20 0056 07/02/20 0226 07/03/20 0359 07/04/20 0253  WBC 13.4* 13.7* 12.7* 11.8* 11.5*  HGB 16.5 16.8 16.3 15.9 15.2  HCT 48.3 47.4 46.0 48.5 45.8  MCV 88.0 87.6 87.6 91.0 89.6  PLT 304 253 229 163 532   Basic Metabolic Panel: Recent Labs  Lab 06/30/20 1550 06/30/20 2016 07/01/20 0056 07/02/20 0226 07/03/20 0359 07/04/20 0253  NA 135  --  137 135 137 136  K 6.8* 5.8* 5.4* 5.7* 5.2* 4.4  CL 99  --  100 102 103 104  CO2 23  --  26 25 25 23   GLUCOSE 273*  --  204* 143* 93 97  BUN 33*  --  26* 26* 24* 19  CREATININE 1.07  --  0.94 0.77 0.73 0.65  CALCIUM 9.0  --  9.3 8.9 8.7* 8.7*   Liver Function Tests: Recent Labs  Lab 06/29/20 0216 07/01/20 0056 07/02/20 0226 07/03/20 0359  AST 18 17 20 20   ALT 54* 55* 62* 60*  ALKPHOS 62 61 61 53  BILITOT 1.2 0.9 1.2 1.3*  PROT 5.8* 5.8* 6.3* 5.3*  ALBUMIN 2.7* 2.8* 2.7* 2.6*   Coagulation Profile: No results for input(s): INR, PROTIME in the last 168 hours. HbA1C: No results for input(s): HGBA1C in the last 72 hours. CBG: Recent Labs  Lab 07/04/20 1144 07/04/20 1619 07/04/20 2133 07/05/20 0720 07/05/20 1154  GLUCAP 105* 220* 223* 132* 184*    No results found for  this or any previous visit (from the past 240 hour(s)).   Radiology Studies: No results found.  Marzetta Board, MD, PhD [facility]  Between 7 am - 7 pm I am available, please contact me via Amion or Securechat  Between 7 pm - 7 am I am not available, please contact night coverage MD/APP via Amion

## 2020-07-05 NOTE — Plan of Care (Signed)

## 2020-07-05 NOTE — Progress Notes (Signed)
Pharmacy Antibiotic Note  Bradley French is a 80 y.o. male admitted on 06/27/2020 with acute hypoxic respiratory failure secondary to COVID pneumonia.  Pharmacy has been consulted for cefepime and vancomycin dosing for HCAP (for 5-day course).  Last WBC on 2/3 - 11.5, SCr stable. Tmax 99.4  Plan: Cefepime 2 gm IV Q 8 hrs X 5 days Monitor WBC, temp, clinical improvement, renal function, vancomycin levels if indicated  Height: 6\' 4"  (193 cm) Weight: 108.9 kg (240 lb) IBW/kg (Calculated) : 86.8  Temp (24hrs), Avg:99 F (37.2 C), Min:98.8 F (37.1 C), Max:99.4 F (37.4 C)  Recent Labs  Lab 06/29/20 0216 06/30/20 1034 06/30/20 1550 07/01/20 0056 07/02/20 0226 07/03/20 0359 07/04/20 0253  WBC 13.4*  --   --  13.7* 12.7* 11.8* 11.5*  CREATININE 1.02   < > 1.07 0.94 0.77 0.73 0.65   < > = values in this interval not displayed.    Estimated Creatinine Clearance: 102.9 mL/min (by C-G formula based on SCr of 0.65 mg/dL).    Allergies  Allergen Reactions  . Sulfonamide Derivatives Rash    Antimicrobials this admission: Remdesivir: 1/14-1/20 (total of 5 doses given) Cefepime: 2/1 >> (5 days) Vancomycin: 2/1 >> 2/3  Microbiology results: 1/10 COVID: positive  Thank you for allowing pharmacy to be a part of this patient's care.  Sloan Leiter, PharmD, BCPS, BCCCP Clinical Pharmacist Please refer to [facility] for [place] numbers 07/05/2020 8:41 AM

## 2020-07-05 NOTE — Progress Notes (Signed)
   NAME:  Bradley French, MRN:  [id number], DOB:  [birth year], LOS: 21 ADMISSION DATE:  07-10-2020, CONSULTATION DATE:  07/02/2020 REFERRING MD:  Dr. Wyline Copas, CHIEF COMPLAINT:  COVID pneumonia   Brief History:  80 year old male admitted with COVID pneumonia, since unable to wean off salter HFNC.  Pulmonary consulted for further recommendations.   Past Medical History:  Tobacco abuse, OSA on CPAP, reported COPD, CAD s/p CABG 2006, DMT2  COVID vaccinated x 2  Significant Hospital Events:  1/14 admitted [place]  Consults:  Pulmonary 2/1  Procedures:   Significant Diagnostic Tests:  2/1 CTA PE >> 1. No CT evidence of pulmonary embolism. 2. Small to moderate pneumomediastinum. 3. Multifocal pneumonia and suggestive of COVID pneumonia. Clinical correlation and follow-up recommended. 4. Aortic Atherosclerosis and Emphysema  Esophagram completed on 2/2 There was poor esophageal motility noted 13 mm barium tablet would not pass beyond distal esophagitis felt secondary to significant dysmotility  Micro Data:  1/10 SARS positive   Antimicrobials:  1/14 remdesivir >> 1/19 1/14 baricitinib >> 1/28 1/14 solumedrol >>off  Interim History / Subjective:  No acute events overnight Patient with flat affect and little interaction with me this morning.  Will answer questions with one-word replies only.  Objective   Blood pressure 109/63, pulse 96, temperature 99.4 F (37.4 C), resp. rate 20, height 6\' 4"  (1.93 m), weight 108.9 kg, SpO2 91 %.        Intake/Output Summary (Last 24 hours) at 07/05/2020 0813 Last data filed at 07/04/2020 2139 Gross per 24 hour  Intake --  Output 500 ml  Net -500 ml   Filed Weights   2020-07-10 1039  Weight: 108.9 kg   Examination: General: Chronically ill appearing elderly male with flat affect lying in bed in no acute distress  HEENT: [place]/AT, MM pink/moist, PERRL, nasal cannula in place Neuro: Alert and oriented x3, nonfocal, limited interaction CV: s1s2 regular  rate and rhythm, no murmur, rubs, or gallops,  PULM: Slightly diminished breath sounds bilaterally, remains on high flow nasal cannula fluctuating between 13 to 15 L high flow GI: soft, bowel sounds active in all 4 quadrants, non-tender, non-distended Extremities: warm/dry, no edema  Skin: no rashes or lesions  Resolved Hospital Problem list   COVID PNA  Assessment & Plan:   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  Looking at his esophagram I wonder if his pneumonia was a consequence of aspiration given poor esophageal motility, often times persistent hypoxia mixed with esophageal dysfunction creates high risk of aspiration Plan: Vancomycin stopped 2/3 Continue Cefepime Continue with reflux precautions  PPI   Best practice (evaluated daily)  Diet: per primary  Pain/Anxiety/Delirium protocol (if indicated): tylenol prn  VAP protocol (if indicated): n/a DVT prophylaxis: lovenox GI prophylaxis: n/a Glucose control: per primary  Mobility: PT Disposition: PCU   Johnsie Cancel, NP-C [facility] Personal contact information can be found on Amion  If no response please page: Adult pulmonary and critical care medicine pager on Amion unitl 7pm After 7pm please call [phone] 07/05/2020, 8:19 AM

## 2020-07-05 NOTE — TOC Progression Note (Signed)
Transition of Care [facility]) - Progression Note    Patient Details  Name: Bradley French MRN: [id number] Date of Birth: 01/18/1941  [facility]) CM/SW Contact  Joanne Chars, LCSW Phone Number: 07/05/2020, 2:54 PM  Clinical Narrative:   CSW spoke with pt wife Blanch Media, updated her that [facility] continues to be our only bed offer.  Pt is still on 15L O2 and she is aware that he cannot go to SNF at that level.      Expected Discharge Plan: [place] Barriers to Discharge: Continued Medical Work up,SNF Pending bed offer  Expected Discharge Plan and Services Expected Discharge Plan: [place] Choice: [place] arrangements for the past 2 months: Single Family Home                                       Social Determinants of Health (SDOH) Interventions    Readmission Risk Interventions No flowsheet data found.

## 2020-07-05 NOTE — Progress Notes (Signed)
Physical Therapy Treatment Patient Details Name: Bradley French MRN: [id number] DOB: 1940-07-03 Today's Date: 07/05/2020    History of Present Illness 80 yo male presenting with cough and shortness of breath. Tested COVID-19 positive on 1/9. Vaccinated x2. PMH including CAD s/p CABG in 2006, HTN, IIDM, OSA on CPAP, and COPD.    PT Comments    Pt was seen for ROM to BLE's with pt declining to get OOB.  He is feeling tired and sats at rest are 89%.  At end of session by exercising carefully in bed, he was at 92% sats.  Pt is using supplemental O2, and is working on breathing exercises in bed to improve his tolerance for activity.  Follow along with him to get OOB and walk next session, but pt did sit up in the chair today for 2 hours.  See for goals of acute PT.   Follow Up Recommendations  SNF     Equipment Recommendations       Recommendations for Other Services       Precautions / Restrictions Precautions Precautions: Fall Precaution Comments: monitor sats Restrictions Weight Bearing Restrictions: No    Mobility  Bed Mobility Overal bed mobility: Needs Assistance Bed Mobility: Rolling Rolling: Min assist            Transfers                 General transfer comment: declined  Ambulation/Gait                 Stairs             Wheelchair Mobility    Modified Rankin (Stroke Patients Only)       Balance Overall balance assessment: Needs assistance     Sitting balance - Comments: declined to sit up in bed                                    Cognition Arousal/Alertness: Awake/alert Behavior During Therapy: Flat affect Overall Cognitive Status: Within Functional Limits for tasks assessed                     Current Attention Level: Selective           General Comments: following along in conversation      Exercises General Exercises - Lower Extremity Ankle Circles/Pumps: AAROM;5 reps Quad Sets: 10  reps;AROM Heel Slides: AAROM;10 reps Hip ABduction/ADduction: AAROM;10 reps Straight Leg Raises: AAROM;10 reps    General Comments General comments (skin integrity, edema, etc.): pt is declining to be up but agreed to do ther exercise      Pertinent Vitals/Pain Pain Assessment: No/denies pain    Home Living                      Prior Function            PT Goals (current goals can now be found in the care plan section) Acute Rehab PT Goals Patient Stated Goal: none PT Goal Formulation: With patient Progress towards PT goals: Progressing toward goals    Frequency    Min 2X/week      PT Plan Current plan remains appropriate    Co-evaluation              AM-PAC PT "6 Clicks" Mobility   Outcome Measure  Help needed turning from your back to your side  while in a flat bed without using bedrails?: A Little Help needed moving from lying on your back to sitting on the side of a flat bed without using bedrails?: A Little Help needed moving to and from a bed to a chair (including a wheelchair)?: A Little Help needed standing up from a chair using your arms (e.g., wheelchair or bedside chair)?: A Little Help needed to walk in hospital room?: A Little Help needed climbing 3-5 steps with a railing? : A Lot 6 Click Score: 17    End of Session Equipment Utilized During Treatment: Oxygen Activity Tolerance: Patient limited by fatigue;Treatment limited secondary to medical complications (Comment) Patient left: in bed;with bed alarm set;with call bell/phone within reach;with family/visitor present Nurse Communication: Mobility status;Other (comment) PT Visit Diagnosis: Muscle weakness (generalized) (M62.81);Unsteadiness on feet (R26.81)     Time: 2330-0762 PT Time Calculation (min) (ACUTE ONLY): 28 min  Charges:  $Therapeutic Exercise: 23-37 mins               Ramond Dial 07/05/2020, 9:01 PM  Mee Hives, PT MS Acute Rehab Dept. Number: [place] and [place]

## 2020-07-06 ENCOUNTER — Inpatient Hospital Stay (HOSPITAL_COMMUNITY): Payer: No Typology Code available for payment source

## 2020-07-06 DIAGNOSIS — R0902 Hypoxemia: Secondary | ICD-10-CM

## 2020-07-06 DIAGNOSIS — U071 COVID-19: Secondary | ICD-10-CM | POA: Diagnosis not present

## 2020-07-06 DIAGNOSIS — J9601 Acute respiratory failure with hypoxia: Secondary | ICD-10-CM | POA: Diagnosis not present

## 2020-07-06 DIAGNOSIS — R0603 Acute respiratory distress: Secondary | ICD-10-CM | POA: Diagnosis not present

## 2020-07-06 LAB — COMPREHENSIVE METABOLIC PANEL
ALT: 41 U/L (ref 0–44)
AST: 13 U/L — ABNORMAL LOW (ref 15–41)
Albumin: 2 g/dL — ABNORMAL LOW (ref 3.5–5.0)
Alkaline Phosphatase: 64 U/L (ref 38–126)
Anion gap: 8 (ref 5–15)
BUN: 13 mg/dL (ref 8–23)
CO2: 22 mmol/L (ref 22–32)
Calcium: 8.5 mg/dL — ABNORMAL LOW (ref 8.9–10.3)
Chloride: 105 mmol/L (ref 98–111)
Creatinine, Ser: 0.67 mg/dL (ref 0.61–1.24)
GFR, Estimated: >60 mL/min (ref >60)
Glucose, Bld: 226 mg/dL — ABNORMAL HIGH (ref 70–99)
Potassium: 4 mmol/L (ref 3.5–5.1)
Sodium: 135 mmol/L (ref 135–145)
Total Bilirubin: 1.2 mg/dL (ref 0.3–1.2)
Total Protein: 4.9 g/dL — ABNORMAL LOW (ref 6.5–8.1)

## 2020-07-06 LAB — CBC
HCT: 41.1 % (ref 39.0–52.0)
Hemoglobin: 13.9 g/dL (ref 13.0–17.0)
MCH: 29.9 pg (ref 26.0–34.0)
MCHC: 33.8 g/dL (ref 30.0–36.0)
MCV: 88.4 fL (ref 80.0–100.0)
Platelets: UNDETERMINED 10*3/uL (ref 150–400)
RBC: 4.65 MIL/uL (ref 4.22–5.81)
RDW: 13.7 % (ref 11.5–15.5)
WBC: 10.8 10*3/uL — ABNORMAL HIGH (ref 4.0–10.5)
nRBC: 0 % (ref 0.0–0.2)

## 2020-07-06 LAB — GLUCOSE, CAPILLARY
Glucose-Capillary: 121 mg/dL — ABNORMAL HIGH (ref 70–99)
Glucose-Capillary: 153 mg/dL — ABNORMAL HIGH (ref 70–99)
Glucose-Capillary: 124 mg/dL — ABNORMAL HIGH (ref 70–99)
Glucose-Capillary: 191 mg/dL — ABNORMAL HIGH (ref 70–99)

## 2020-07-06 LAB — D-DIMER, QUANTITATIVE: D-Dimer, Quant: 0.76 ug/mL-FEU — ABNORMAL HIGH (ref 0.00–0.50)

## 2020-07-06 MED ORDER — FUROSEMIDE 10 MG/ML IJ SOLN
40.0000 mg | Freq: Once | INTRAMUSCULAR | Status: AC
  Administered 2020-07-06: 40 mg via INTRAVENOUS
  Filled 2020-07-06: qty 4

## 2020-07-06 MED ORDER — METHYLPREDNISOLONE SODIUM SUCC 125 MG IJ SOLR
125.0000 mg | Freq: Every day | INTRAMUSCULAR | Status: AC
  Administered 2020-07-06 – 2020-07-08 (×3): 125 mg via INTRAVENOUS
  Filled 2020-07-06 (×3): qty 2

## 2020-07-06 NOTE — Progress Notes (Signed)
Pt in bed labored breathing. O285% on HFNC and nonrebreather. Resp 40. HR 115. MD made aware. Repositioned O2 improved to low 90s Respiratory called for heated high flow.

## 2020-07-06 NOTE — Plan of Care (Signed)

## 2020-07-06 NOTE — Progress Notes (Signed)
   NAME:  Bradley French, MRN:  [id number], DOB:  Sep 14, 1940, LOS: 75 ADMISSION DATE:  06/06/2020, CONSULTATION DATE:  07/02/2020 REFERRING MD:  Dr. Wyline Copas, CHIEF COMPLAINT:  COVID pneumonia   Brief History:  80 year old male admitted with COVID pneumonia, since unable to wean off salter HFNC.  Pulmonary consulted for further recommendations.   Past Medical History:  Tobacco abuse, OSA on CPAP, reported COPD, CAD s/p CABG 2006, DMT2  COVID vaccinated x 2  Significant Hospital Events:  1/14 admitted [place]  Consults:  Pulmonary 2/1  Procedures:   Significant Diagnostic Tests:  2/1 CTA PE >> 1. No CT evidence of pulmonary embolism. 2. Small to moderate pneumomediastinum. 3. Multifocal pneumonia and suggestive of COVID pneumonia. Clinical correlation and follow-up recommended. 4. Aortic Atherosclerosis and Emphysema  Esophagram completed on 2/2 There was poor esophageal motility noted 13 mm barium tablet would not pass beyond distal esophagitis felt secondary to significant dysmotility  Micro Data:  1/10 SARS positive   Antimicrobials:  1/14 remdesivir >> 1/19 1/14 baricitinib >> 1/28 1/14 solumedrol >>off  Interim History / Subjective:  Worsening oxygen. Now on HHFNC, tachypneic.  Objective   Blood pressure 104/63, pulse 97, temperature 98.1 F (36.7 C), temperature source Oral, resp. rate (!) 40, height 6\' 4"  (1.93 m), weight 108.9 kg, SpO2 91 %.    FiO2 (%):  [100 %] 100 %   Intake/Output Summary (Last 24 hours) at 07/06/2020 1543 Last data filed at 07/06/2020 0848 Gross per 24 hour  Intake -  Output 625 ml  Net -625 ml   Filed Weights   06/08/2020 1039  Weight: 108.9 kg   Examination: General: Chronically ill appearing elderly male with flat affect lying in bed in no acute distress  HEENT: 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 Counseled and recommended DNR. Patient contemplating. Discussed with wife at length. Both share concern he is actively dying. Recommended comfort care if worsens and he is unable to make decisions.  Plan: Escalated to heated HFNC, FiO2 100% SPO2 goal >88% Encourage pulmonary hygiene  Solumedrol 125 mg daily x 3 days   H/o COPD Plan: Continue BDs   HCAP with ongoing cough which has been nonproductive Plan: Vancomycin stopped 2/3 Continue Cefepime Continue with reflux precautions  PPI   Best practice (evaluated daily)  Diet: per primary  Pain/Anxiety/Delirium protocol (if indicated): tylenol prn  VAP protocol (if indicated): n/a DVT prophylaxis: lovenox GI prophylaxis: n/a Glucose control: per primary  Mobility: PT Disposition: PCU   Lanier Clam, MD

## 2020-07-06 NOTE — Progress Notes (Signed)
PROGRESS NOTE  Bradley French WUJ:[id number] DOB: 1940/11/09 DOA: 07/02/20 PCP: [facility]   LOS: 22 days   Brief Narrative / Interim history:  80 y.o.malewith medical history significant ofCAD s/p CABG in 2006, HTN, IIDM, OSA on CPAP,COPD, presented with cough and increasing short of breath. Patient started to have symptoms of dry cough and shortness of breath 6 days ago, and he went to possible COVID-19 5 days ago. He also had subjective fever and chills, loss of appetite but no abdominal pain or diarrhea. He has not been eating or drinking much for the last 3 to 4 days. He was vaccinated for COVID x 2. Went to see his PCP who checked his pulse ox in the office and found low and told him to come to ED. COVID-positive with chest x-ray showed bilateral multifocal infiltrates  Subjective / 24h Interval events: Breathing seems to be a little bit worse today however he denies any shortness of breath.  Appears tachypneic.  No chest pain  Assessment & Plan: Principal Problem Acute hypoxic respiratory failure due to COVID-19 pneumonia, pneumomediastinum, concern for HCAP-has been treated with baricitinib, Remdesivir, steroids.  Now has persistent hypoxemia and unfortunately seems to be getting a little bit worse, on 15 L and nonrebreather this morning.  Repeat the chest x-ray today -Pulmonary following, appreciate input -CT angiogram with findings of small to moderate pneumomediastinum  -Currently on Vanco cefepime started on 2/1, off Vanco 2/3, continue cefepime and tomorrow is the last day and complete a 7-day course  Active Problems Type 2 diabetes mellitus-continue Lantus, NovoLog.  Now off steroids, decrease doses to avoid hypoglycemia.  CBGs acceptable  CBG (last 3)  Recent Labs    07/05/20 1154 07/05/20 1656 07/06/20 0728  GLUCAP 184* 148* 121*    Deconditioning, ambulatory dysfunction, POA -PT recommends SNF, placement pending  Essential  hypertension -Continue home regimen, blood pressure stable  Scheduled Meds: . (feeding supplement) PROSource Plus  30 mL Oral TID BM  . amLODipine  10 mg Oral Q2000  . aspirin EC  81 mg Oral Q2000  . diclofenac Sodium  2 g Topical Q2000  . dronabinol  2.5 mg Oral BID AC  . enoxaparin (LOVENOX) injection  40 mg Subcutaneous Q12H  . feeding supplement (GLUCERNA SHAKE)  237 mL Oral QID  . insulin aspart  0-15 Units Subcutaneous TID WC  . insulin aspart  0-5 Units Subcutaneous QHS  . insulin aspart  5 Units Subcutaneous TID WC  . insulin glargine  10 Units Subcutaneous QHS  . Ipratropium-Albuterol  1 puff Inhalation Q6H  . mometasone-formoterol  2 puff Inhalation BID  . multivitamin with minerals  1 tablet Oral Daily  . nicotine  21 mg Transdermal Daily  . polyethylene glycol  17 g Oral BID   Continuous Infusions: . ceFEPime (MAXIPIME) IV 2 g (07/06/20 0842)   PRN Meds:.acetaminophen, albuterol, guaiFENesin-dextromethorphan, ondansetron **OR** ondansetron (ZOFRAN) IV  Diet Orders (From admission, onward)    Start     Ordered   06/27/20 1225  Diet regular Room service appropriate? Yes; Fluid consistency: Thin  Diet effective now       Question Answer Comment  Room service appropriate? Yes   Fluid consistency: Thin      06/27/20 1224          DVT prophylaxis: enoxaparin (LOVENOX) injection 40 mg Start: 06/21/20 1100     Code Status: Full Code  Family Communication: Discussed with patient  Status is: Inpatient  Remains inpatient appropriate  because:Inpatient level of care appropriate due to severity of illness   Dispo: The patient is from: Home              Anticipated d/c is to: SNF              Anticipated d/c date is: 2 days              Patient currently is not medically stable to d/c.   Difficult to place patient No  Level of care: Telemetry Medical  Consultants:  Pulmonary   Procedures:  none  Microbiology  none  Antimicrobials: Vancomycin /  Cefepime 2/1 >>   Objective: Vitals:   07/05/20 2247 07/06/20 0107 07/06/20 0539 07/06/20 0811  BP: 109/64  107/60   Pulse: (!) 109 (!) 108 97 98  Resp: 20 20 20  (!) 28  Temp: 99.6 F (37.6 C)  98.1 F (36.7 C)   TempSrc: Oral  Oral   SpO2: 95% 92% 94% (!) 89%  Weight:      Height:        Intake/Output Summary (Last 24 hours) at 07/06/2020 1019 Last data filed at 07/06/2020 0848 Gross per 24 hour  Intake --  Output 625 ml  Net -625 ml   Filed Weights   06/10/2020 1039  Weight: 108.9 kg    Examination:  Constitutional: More tachypneic Eyes: No icterus ENMT: Moist external drains Neck: normal, supple Respiratory: Bibasilar rhonchi, no wheezing, increased respiratory effort, tachypneic Cardiovascular: Regular rate and rhythm, no murmurs, tachycardic today, no edema Abdomen: Soft, nontender, nondistended, bowel sounds positive Musculoskeletal: no clubbing / cyanosis.  Skin: No rashes seen Neurologic: Nonfocal, equal strength  Data Reviewed: I have independently reviewed following labs and imaging studies   CBC: Recent Labs  Lab 07/01/20 0056 07/02/20 0226 07/03/20 0359 07/04/20 0253 07/06/20 0147  WBC 13.7* 12.7* 11.8* 11.5* 10.8*  HGB 16.8 16.3 15.9 15.2 13.9  HCT 47.4 46.0 48.5 45.8 41.1  MCV 87.6 87.6 91.0 89.6 88.4  PLT 253 229 163 157 PLATELET CLUMPS NOTED ON SMEAR, UNABLE TO ESTIMATE   Basic Metabolic Panel: Recent Labs  Lab 07/01/20 0056 07/02/20 0226 07/03/20 0359 07/04/20 0253 07/06/20 0147  NA 137 135 137 136 135  K 5.4* 5.7* 5.2* 4.4 4.0  CL 100 102 103 104 105  CO2 26 25 25 23 22   GLUCOSE 204* 143* 93 97 226*  BUN 26* 26* 24* 19 13  CREATININE 0.94 0.77 0.73 0.65 0.67  CALCIUM 9.3 8.9 8.7* 8.7* 8.5*   Liver Function Tests: Recent Labs  Lab 07/01/20 0056 07/02/20 0226 07/03/20 0359 07/06/20 0147  AST 17 20 20  13*  ALT 55* 62* 60* 41  ALKPHOS 61 61 53 64  BILITOT 0.9 1.2 1.3* 1.2  PROT 5.8* 6.3* 5.3* 4.9*  ALBUMIN 2.8* 2.7* 2.6*  2.0*   Coagulation Profile: No results for input(s): INR, PROTIME in the last 168 hours. HbA1C: No results for input(s): HGBA1C in the last 72 hours. CBG: Recent Labs  Lab 07/04/20 2133 07/05/20 0720 07/05/20 1154 07/05/20 1656 07/06/20 0728  GLUCAP 223* 132* 184* 148* 121*    No results found for this or any previous visit (from the past 240 hour(s)).   Radiology Studies: No results found.  Marzetta Board, MD, PhD [facility]  Between 7 am - 7 pm I am available, please contact me via Amion or Securechat  Between 7 pm - 7 am I am not available, please contact night coverage MD/APP via Amion

## 2020-07-07 DIAGNOSIS — J9601 Acute respiratory failure with hypoxia: Secondary | ICD-10-CM | POA: Diagnosis not present

## 2020-07-07 DIAGNOSIS — R0902 Hypoxemia: Secondary | ICD-10-CM | POA: Diagnosis not present

## 2020-07-07 DIAGNOSIS — U071 COVID-19: Secondary | ICD-10-CM | POA: Diagnosis not present

## 2020-07-07 DIAGNOSIS — R0603 Acute respiratory distress: Secondary | ICD-10-CM

## 2020-07-07 LAB — COMPREHENSIVE METABOLIC PANEL
ALT: 43 U/L (ref 0–44)
AST: 15 U/L (ref 15–41)
Albumin: 2.1 g/dL — ABNORMAL LOW (ref 3.5–5.0)
Alkaline Phosphatase: 69 U/L (ref 38–126)
Anion gap: 11 (ref 5–15)
BUN: 12 mg/dL (ref 8–23)
CO2: 23 mmol/L (ref 22–32)
Calcium: 8.6 mg/dL — ABNORMAL LOW (ref 8.9–10.3)
Chloride: 102 mmol/L (ref 98–111)
Creatinine, Ser: 0.67 mg/dL (ref 0.61–1.24)
GFR, Estimated: >60 mL/min (ref >60)
Glucose, Bld: 200 mg/dL — ABNORMAL HIGH (ref 70–99)
Potassium: 4.2 mmol/L (ref 3.5–5.1)
Sodium: 136 mmol/L (ref 135–145)
Total Bilirubin: 1.5 mg/dL — ABNORMAL HIGH (ref 0.3–1.2)
Total Protein: 5.9 g/dL — ABNORMAL LOW (ref 6.5–8.1)

## 2020-07-07 LAB — CBC
HCT: 41 % (ref 39.0–52.0)
Hemoglobin: 14.4 g/dL (ref 13.0–17.0)
MCH: 30.6 pg (ref 26.0–34.0)
MCHC: 35.1 g/dL (ref 30.0–36.0)
MCV: 87.2 fL (ref 80.0–100.0)
Platelets: UNDETERMINED 10*3/uL (ref 150–400)
RBC: 4.7 MIL/uL (ref 4.22–5.81)
RDW: 13.6 % (ref 11.5–15.5)
WBC: 9.4 10*3/uL (ref 4.0–10.5)
nRBC: 0 % (ref 0.0–0.2)

## 2020-07-07 LAB — C-REACTIVE PROTEIN: CRP: 27.4 mg/dL — ABNORMAL HIGH (ref <1.0)

## 2020-07-07 LAB — D-DIMER, QUANTITATIVE: D-Dimer, Quant: 0.68 ug/mL-FEU — ABNORMAL HIGH (ref 0.00–0.50)

## 2020-07-07 LAB — GLUCOSE, CAPILLARY
Glucose-Capillary: 198 mg/dL — ABNORMAL HIGH (ref 70–99)
Glucose-Capillary: 292 mg/dL — ABNORMAL HIGH (ref 70–99)
Glucose-Capillary: 289 mg/dL — ABNORMAL HIGH (ref 70–99)
Glucose-Capillary: 312 mg/dL — ABNORMAL HIGH (ref 70–99)

## 2020-07-07 NOTE — Progress Notes (Signed)
   NAME:  Bradley French, MRN:  [id number], DOB:  06/29/1940, LOS: 48 ADMISSION DATE:  06/15/2020, CONSULTATION DATE:  07/02/2020 REFERRING MD:  Dr. Wyline Copas, CHIEF COMPLAINT:  COVID pneumonia   Brief History:  80 year old male admitted with COVID pneumonia, since unable to wean off salter HFNC.  Pulmonary consulted for further recommendations.   Past Medical History:  Tobacco abuse, OSA on CPAP, reported COPD, CAD s/p CABG 2006, DMT2  COVID vaccinated x 2  Significant Hospital Events:  1/14 admitted [facility]  Consults:  Pulmonary 2/1  Procedures:   Significant Diagnostic Tests:  2/1 CTA PE >> 1. No CT evidence of pulmonary embolism. 2. Small to moderate pneumomediastinum. 3. Multifocal pneumonia and suggestive of COVID pneumonia. Clinical correlation and follow-up recommended. 4. Aortic Atherosclerosis and Emphysema  Esophagram completed on 2/2 There was poor esophageal motility noted 13 mm barium tablet would not pass beyond distal esophagitis felt secondary to significant dysmotility  Micro Data:  1/10 SARS positive   Antimicrobials:  1/14 remdesivir >> 1/19 1/14 baricitinib >> 1/28 1/14 solumedrol >>off 2/5 solumedrol   Interim History / Subjective:  WOB a bit better, describes orthostatic changes.  Objective   Blood pressure 106/66, pulse 92, temperature 98.1 F (36.7 C), resp. rate (!) 21, height 6\' 4"  (1.93 m), weight 108.9 kg, SpO2 91 %.    FiO2 (%):  [90 %-100 %] 90 %   Intake/Output Summary (Last 24 hours) at 07/07/2020 1450 Last data filed at 07/07/2020 3664 Gross per 24 hour  Intake --  Output 1100 ml  Net -1100 ml   Filed Weights   06/18/2020 1039  Weight: 108.9 kg   Examination: General: Chronically ill appearing elderly male with flat affect lying in bed in no acute distress  HEENT: [place]/AT, MM pink/moist, PERRL, nasal cannula in place Neuro: Alert and oriented x3, nonfocal, limited interaction CV: s1s2 regular rate and rhythm, no murmur, rubs, or gallops,   PULM: Slightly diminished breath sounds bilaterally, Now on heated HFNC GI: soft, bowel sounds active in all 4 quadrants, non-tender, non-distended Extremities: warm/dry, no edema  Skin: no rashes or lesions  Resolved Hospital Problem list   COVID PNA  Assessment & Plan:   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 Counseled and recommended DNR.  Discussed with wife at length 2/5. We both share concern he is actively dying. Recommended comfort care if worsens and he is unable to make decisions. Dr. Letta Median documents [place] discussion 2/6 that yielded change in code status to DNR. Plan: Escalated to heated HFNC, FiO2 100% SPO2 goal >88% Encourage pulmonary hygiene  Solumedrol 125 mg daily x 3 days (d1 2/5), if responds well can taper, if no response no need for further steroids  H/o COPD Plan: Continue BDs   HCAP with ongoing cough which has been nonproductive Plan: Vancomycin stopped 2/3 Completed course of cefepime Continue with reflux precautions  PPI    Lanier Clam, MD

## 2020-07-07 NOTE — Progress Notes (Signed)
PROGRESS NOTE  Bradley French [id number]:[id number] DOB: 09-13-1940 DOA: 02-Jul-2020 PCP: [facility]   LOS: 23 days   Brief Narrative / Interim history:  80 y.o.malewith medical history significant ofCAD s/p CABG in 2006, HTN, IIDM, OSA on CPAP,COPD, presented with cough and increasing short of breath. Patient started to have symptoms of dry cough and shortness of breath 6 days ago, and he went to possible COVID-19 5 days ago. He also had subjective fever and chills, loss of appetite but no abdominal pain or diarrhea. He has not been eating or drinking much for the last 3 to 4 days. He was vaccinated for COVID x 2. Went to see his PCP who checked his pulse ox in the office and found low and told him to come to ED. COVID-positive with chest x-ray showed bilateral multifocal infiltrates  Subjective / 24h Interval events: Has been placed on heated high flow in the interim, appears more comfortable  Assessment & Plan: Principal Problem Acute hypoxic respiratory failure due to COVID-19 pneumonia, pneumomediastinum, concern for HCAP-has been treated with baricitinib, Remdesivir, steroids.  Despite aggressive treatment his respiratory status is not improving more than 3 weeks out from his admission, suggesting a very poor prognosis.  Over the last couple of days he has gotten worse and now he is on heated high flow.  Back on steroids since 2/5.  Pulmonary following -CT angiogram with findings of small to moderate pneumomediastinum, chest x-ray 2/5 sooner with prior -Completed Vanco cefepime for 7 days, last dose of cefepime 2/6  Active Problems Type 2 diabetes mellitus-continue Lantus, NovoLog.  Now off steroids, decrease doses to avoid hypoglycemia.  CBGs acceptable  CBG (last 3)  Recent Labs    07/06/20 2153 07/07/20 0737 07/07/20 1126  GLUCAP 191* 198* 43*    Deconditioning, ambulatory dysfunction, POA -PT recommends SNF, placement pending  Essential  hypertension -Continue home regimen, blood pressure stable  Goals of care -Long discussion today with the patient's wife as well as daughter over the phone.  He is getting worse, despite maximal therapy continues to decline currently on heated high flow this far out from the initial infection, suggesting severe damage to his lung tissue.  Wife relates that he has been a long term smoker, has known lung disease and emphysematous changes and he has continued to smoke up until he got sick.  She also mentioned that he has been drinking heavily until last month when he was hospitalized.  The patient indicated to his wife that he would not want be intubated.  I think at this point I recommended DNR/DNI, in the case of a code he would unlikely to recover to the point that he would have a meaningful life.  If he were to go on a ventilator is almost certain he would never come off.  She agrees with DNR/DNI.  I recommend we allow more than 1 family member at a time given worsening in the last 24 hours, DNR status and poor prognosis  Scheduled Meds: . (feeding supplement) PROSource Plus  30 mL Oral TID BM  . amLODipine  10 mg Oral Q2000  . aspirin EC  81 mg Oral Q2000  . diclofenac Sodium  2 g Topical Q2000  . dronabinol  2.5 mg Oral BID AC  . enoxaparin (LOVENOX) injection  40 mg Subcutaneous Q12H  . feeding supplement (GLUCERNA SHAKE)  237 mL Oral QID  . insulin aspart  0-15 Units Subcutaneous TID WC  . insulin aspart  0-5 Units  Subcutaneous QHS  . insulin aspart  5 Units Subcutaneous TID WC  . insulin glargine  10 Units Subcutaneous QHS  . Ipratropium-Albuterol  1 puff Inhalation Q6H  . methylPREDNISolone (SOLU-MEDROL) injection  125 mg Intravenous Daily  . mometasone-formoterol  2 puff Inhalation BID  . multivitamin with minerals  1 tablet Oral Daily  . nicotine  21 mg Transdermal Daily  . polyethylene glycol  17 g Oral BID   Continuous Infusions:  PRN Meds:.acetaminophen, albuterol,  guaiFENesin-dextromethorphan, ondansetron **OR** ondansetron (ZOFRAN) IV  Diet Orders (From admission, onward)    Start     Ordered   06/27/20 1225  Diet regular Room service appropriate? Yes; Fluid consistency: Thin  Diet effective now       Question Answer Comment  Room service appropriate? Yes   Fluid consistency: Thin      06/27/20 1224          DVT prophylaxis: enoxaparin (LOVENOX) injection 40 mg Start: 06/21/20 1100     Code Status: Full Code  Family Communication: Discussed with patient  Status is: Inpatient  Remains inpatient appropriate because:Inpatient level of care appropriate due to severity of illness   Dispo: The patient is from: Home              Anticipated d/c is to: SNF or perhaps in hospital death              Anticipated d/c date is: > 3 days              Patient currently is not medically stable to d/c.   Difficult to place patient No  Level of care: Telemetry Medical  Consultants:  Pulmonary   Procedures:  none  Microbiology  none  Antimicrobials: Vancomycin / Cefepime 2/1 >> 2/6   Objective: Vitals:   07/07/20 0038 07/07/20 0540 07/07/20 0755 07/07/20 0922  BP: 121/70 99/70  109/67  Pulse: 88 90 88 86  Resp: 19 20 (!) 30 (!) 21  Temp: 97.7 F (36.5 C) 97.6 F (36.4 C)  (!) 97.4 F (36.3 C)  TempSrc: Oral Oral    SpO2: 93% 94% 97% 95%  Weight:      Height:        Intake/Output Summary (Last 24 hours) at 07/07/2020 1207 Last data filed at 07/07/2020 3536 Gross per 24 hour  Intake --  Output 1100 ml  Net -1100 ml   Filed Weights   2020/07/09 1039  Weight: 108.9 kg    Examination:  Constitutional: More comfortable on heated high flow Eyes: No icterus ENMT: mmm Neck: normal, supple Respiratory: Coarse breath sounds bilaterally, tachypneic Cardiovascular: Regular rate and rhythm, no murmurs Abdomen: Soft, NT, ND, bowel sounds positive Musculoskeletal: no clubbing / cyanosis.  Skin: No rashes Neurologic:  Nonfocal  Data Reviewed: I have independently reviewed following labs and imaging studies   CBC: Recent Labs  Lab 07/02/20 0226 07/03/20 0359 07/04/20 0253 07/06/20 0147 07/07/20 0137  WBC 12.7* 11.8* 11.5* 10.8* 9.4  HGB 16.3 15.9 15.2 13.9 14.4  HCT 46.0 48.5 45.8 41.1 41.0  MCV 87.6 91.0 89.6 88.4 87.2  PLT 229 163 157 PLATELET CLUMPS NOTED ON SMEAR, UNABLE TO ESTIMATE PLATELET CLUMPS NOTED ON SMEAR, UNABLE TO ESTIMATE   Basic Metabolic Panel: Recent Labs  Lab 07/02/20 0226 07/03/20 0359 07/04/20 0253 07/06/20 0147 07/07/20 0137  NA 135 137 136 135 136  K 5.7* 5.2* 4.4 4.0 4.2  CL 102 103 104 105 102  CO2 25 25 23 22  23  GLUCOSE 143* 93 97 226* 200*  BUN 26* 24* 19 13 12   CREATININE 0.77 0.73 0.65 0.67 0.67  CALCIUM 8.9 8.7* 8.7* 8.5* 8.6*   Liver Function Tests: Recent Labs  Lab 07/01/20 0056 07/02/20 0226 07/03/20 0359 07/06/20 0147 07/07/20 0137  AST 17 20 20  13* 15  ALT 55* 62* 60* 41 43  ALKPHOS 61 61 53 64 69  BILITOT 0.9 1.2 1.3* 1.2 1.5*  PROT 5.8* 6.3* 5.3* 4.9* 5.9*  ALBUMIN 2.8* 2.7* 2.6* 2.0* 2.1*   Coagulation Profile: No results for input(s): INR, PROTIME in the last 168 hours. HbA1C: No results for input(s): HGBA1C in the last 72 hours. CBG: Recent Labs  Lab 07/06/20 1205 07/06/20 1711 07/06/20 2153 07/07/20 0737 07/07/20 1126  GLUCAP 153* 124* 191* 198* 292*    No results found for this or any previous visit (from the past 240 hour(s)).   Radiology Studies: No results found.  Marzetta Board, MD, PhD [facility]  Between 7 am - 7 pm I am available, please contact me via Amion or Securechat  Between 7 pm - 7 am I am not available, please contact night coverage MD/APP via Amion

## 2020-07-08 DIAGNOSIS — R0603 Acute respiratory distress: Secondary | ICD-10-CM | POA: Diagnosis not present

## 2020-07-08 DIAGNOSIS — R0902 Hypoxemia: Secondary | ICD-10-CM | POA: Diagnosis not present

## 2020-07-08 DIAGNOSIS — U071 COVID-19: Secondary | ICD-10-CM | POA: Diagnosis not present

## 2020-07-08 DIAGNOSIS — J9601 Acute respiratory failure with hypoxia: Secondary | ICD-10-CM | POA: Diagnosis not present

## 2020-07-08 LAB — CBC
HCT: 40.1 % (ref 39.0–52.0)
Hemoglobin: 14.1 g/dL (ref 13.0–17.0)
MCH: 31 pg (ref 26.0–34.0)
MCHC: 35.2 g/dL (ref 30.0–36.0)
MCV: 88.1 fL (ref 80.0–100.0)
Platelets: 123 10*3/uL — ABNORMAL LOW (ref 150–400)
RBC: 4.55 MIL/uL (ref 4.22–5.81)
RDW: 13.8 % (ref 11.5–15.5)
WBC: 10.4 10*3/uL (ref 4.0–10.5)
nRBC: 0 % (ref 0.0–0.2)

## 2020-07-08 LAB — BASIC METABOLIC PANEL
Anion gap: 9 (ref 5–15)
BUN: 20 mg/dL (ref 8–23)
CO2: 24 mmol/L (ref 22–32)
Calcium: 8.5 mg/dL — ABNORMAL LOW (ref 8.9–10.3)
Chloride: 105 mmol/L (ref 98–111)
Creatinine, Ser: 0.85 mg/dL (ref 0.61–1.24)
GFR, Estimated: >60 mL/min (ref >60)
Glucose, Bld: 283 mg/dL — ABNORMAL HIGH (ref 70–99)
Potassium: 5 mmol/L (ref 3.5–5.1)
Sodium: 138 mmol/L (ref 135–145)

## 2020-07-08 LAB — GLUCOSE, CAPILLARY
Glucose-Capillary: 260 mg/dL — ABNORMAL HIGH (ref 70–99)
Glucose-Capillary: 273 mg/dL — ABNORMAL HIGH (ref 70–99)
Glucose-Capillary: 229 mg/dL — ABNORMAL HIGH (ref 70–99)
Glucose-Capillary: 302 mg/dL — ABNORMAL HIGH (ref 70–99)

## 2020-07-08 NOTE — Progress Notes (Addendum)
NAME:  Bradley French, MRN:  [id number], DOB:  10-Sep-1940, LOS: 24 ADMISSION DATE:  06/29/2020, CONSULTATION DATE:  07/02/2020 REFERRING MD:  Dr. Wyline Copas, CHIEF COMPLAINT:  COVID pneumonia   Brief History:  80 year old male admitted with COVID pneumonia, since unable to wean off salter HFNC.  Pulmonary consulted for further recommendations.   Past Medical History:  Tobacco abuse, OSA on CPAP, reported COPD, CAD s/p CABG 2006, DMT2  COVID vaccinated x 2  Significant Hospital Events:  1/14 admitted [place]  Consults:  Pulmonary 2/1  Procedures:   Significant Diagnostic Tests:  2/1 CTA PE >> 1. No CT evidence of pulmonary embolism. 2. Small to moderate pneumomediastinum. 3. Multifocal pneumonia and suggestive of COVID pneumonia. Clinical correlation and follow-up recommended. 4. Aortic Atherosclerosis and Emphysema  Esophagram completed on 2/2 There was poor esophageal motility noted 13 mm barium tablet would not pass beyond distal esophagitis felt secondary to significant dysmotility  Micro Data:  1/10 SARS positive   Antimicrobials:  1/14 remdesivir >> 1/19 1/14 baricitinib >> 1/28 1/14 solumedrol >>off 2/5 solumedrol   Interim History / Subjective:  States that he feels about the same today without significant improvement, remains on 30L HFNC and 90%  Objective   Blood pressure 112/67, pulse 89, temperature 97.9 F (36.6 C), temperature source Oral, resp. rate (!) 28, height 6\' 4"  (1.93 m), weight 108.9 kg, SpO2 93 %.    FiO2 (%):  [90 %] 90 %   Intake/Output Summary (Last 24 hours) at 07/08/2020 1216 Last data filed at 07/08/2020 0345 Gross per 24 hour  Intake --  Output 850 ml  Net -850 ml   Filed Weights   06/13/2020 1039  Weight: 108.9 kg   General:  Elderly, chronically ill-appearing M in no acute distress HEENT: MM pink/dry, on HFNC Neuro: awake and alert, oriented and conversational without obvious focal deficits  CV: s1s2 rrr, no m/r/g PULM:  No acute  distress, no significant wheezing or rhonchi, decreased air entry in bilateral bases GI: soft, bsx4 active  Extremities: warm/dry, no edema  Skin: no rashes or lesions   Resolved Hospital Problem list   COVID PNA  Assessment & Plan:   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 Counseled and recommended DNR.  Discussed with wife at length 2/5. We both share concern he is actively dying. Recommended comfort care if worsens and he is unable to make decisions. Dr. Letta Median documents [place] discussion 2/6 that yielded change in code status to DNR. Plan: -continue HFNC, requiring stable amount of support 30L 90% FiO2 -SPO2 goal >88% -encourage pulmonary hygeine -finished 3 days Solumedrol today without significant response, no further steroids indicated -As patient is likely in fibrotic stage for covid ARDS, there is likely no further efficacious intervention to offer from a pulmonary standpoint -continue nebs and and IS, supportive care -GOC per hospitalist is to continue aggressive care, but is DNR/DNI      H/o COPD Long term tobacco use without evidence of acute exacerbation  Plan: -Continue bronchodilators      HCAP with ongoing cough which has been nonproductive Plan: Vancomycin stopped 2/3 Completed course of cefepime Continue with reflux precautions  PPI    Critical care time 30 minutes  Bradley Carpen Khamia Stambaugh, PA-C [facility] See Amion for pager If no response to pager , please call [phone] until 7pm After 7:00 pm call Elink  [phone]?[place]

## 2020-07-08 NOTE — Progress Notes (Signed)
PROGRESS NOTE  Bradley French [id number] DOB: 01-07-1941 DOA: 06/23/2020 PCP: [facility]   LOS: 24 days   Brief Narrative / Interim history:  80 y.o.malewith medical history significant ofCAD s/p CABG in 2006, HTN, IIDM, OSA on CPAP,COPD, presented with cough and increasing short of breath. Patient started to have symptoms of dry cough and shortness of breath 6 days ago, and he went to possible COVID-19 5 days ago. He also had subjective fever and chills, loss of appetite but no abdominal pain or diarrhea. He has not been eating or drinking much for the last 3 to 4 days. He was vaccinated for COVID x 2. Went to see his PCP who checked his pulse ox in the office and found low and told him to come to ED. COVID-positive with chest x-ray showed bilateral multifocal infiltrates  Subjective / 24h Interval events: Remains severely hypoxic, appears tachypneic this morning but comfortable  Assessment & Plan: Principal Problem Acute hypoxic respiratory failure due to COVID-19 pneumonia, pneumomediastinum, concern for HCAP-has been treated with baricitinib, Remdesivir, steroids.  Despite aggressive treatment his respiratory status is not improving more than 3 weeks out from his admission, suggesting a very poor prognosis.  Over the last couple of days he has gotten worse and now he is on heated high flow.  Back on steroids since 2/5.  Pulmonary following -CT angiogram with findings of small to moderate pneumomediastinum, chest x-ray 2/5 sooner with prior -Completed Vanco cefepime for 7 days, last dose of cefepime 2/6  Active Problems Type 2 diabetes mellitus-continue Lantus, NovoLog.  Monitor  CBG (last 3)  Recent Labs    07/07/20 1539 07/07/20 2128 07/08/20 0827  GLUCAP 289* 312* 260*    Deconditioning, ambulatory dysfunction, POA -PT recommends SNF, placement pending  Essential hypertension -Continue home regimen, blood pressure stable  Goals of care -Long  discussion today with the patient's wife as well as daughter over the phone on 2/6.  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  Wife relates that he has been a long term smoker, has known lung disease and emphysematous changes and he has continued to smoke up until he got sick.  She also mentioned that he has been drinking heavily until last month when he was hospitalized.  Continue DNR/DNI, oxygen, supportive care short of moving to the ICU/intubation/code.  Scheduled Meds: . (feeding supplement) PROSource Plus  30 mL Oral TID BM  . amLODipine  10 mg Oral Q2000  . aspirin EC  81 mg Oral Q2000  . diclofenac Sodium  2 g Topical Q2000  . dronabinol  2.5 mg Oral BID AC  . enoxaparin (LOVENOX) injection  40 mg Subcutaneous Q12H  . feeding supplement (GLUCERNA SHAKE)  237 mL Oral QID  . insulin aspart  0-15 Units Subcutaneous TID WC  . insulin aspart  0-5 Units Subcutaneous QHS  . insulin aspart  5 Units Subcutaneous TID WC  . insulin glargine  10 Units Subcutaneous QHS  . Ipratropium-Albuterol  1 puff Inhalation Q6H  . mometasone-formoterol  2 puff Inhalation BID  . multivitamin with minerals  1 tablet Oral Daily  . nicotine  21 mg Transdermal Daily  . polyethylene glycol  17 g Oral BID   Continuous Infusions:  PRN Meds:.acetaminophen, albuterol, guaiFENesin-dextromethorphan, ondansetron **OR** ondansetron (ZOFRAN) IV  Diet Orders (From admission, onward)  Start     Ordered   06/27/20 1225  Diet regular Room service appropriate? Yes; Fluid consistency: Thin  Diet effective now       Question Answer Comment  Room service appropriate? Yes   Fluid consistency: Thin      06/27/20 1224          DVT prophylaxis: enoxaparin (LOVENOX) injection 40 mg Start: 06/21/20 1100     Code Status: DNR  Family Communication: Wife at  bedside 2/6  Status is: Inpatient  Remains inpatient appropriate because:Inpatient level of care appropriate due to severity of illness   Dispo: The patient is from: Home              Anticipated d/c is to: SNF or perhaps in hospital death              Anticipated d/c date is: > 3 days              Patient currently is not medically stable to d/c.   Difficult to place patient No  Level of care: Telemetry Medical  Consultants:  Pulmonary   Procedures:  none  Microbiology  none  Antimicrobials: Vancomycin / Cefepime 2/1 >> 2/6   Objective: Vitals:   07/07/20 2200 07/08/20 0501 07/08/20 0734 07/08/20 0800  BP: 105/65 123/65  112/67  Pulse: 88 89 91 89  Resp: 20 20  (!) 28  Temp: 98 F (36.7 C) 98.3 F (36.8 C)  97.9 F (36.6 C)  TempSrc: Oral Axillary  Oral  SpO2: 95% 94% 94% 93%  Weight:      Height:        Intake/Output Summary (Last 24 hours) at 07/08/2020 0946 Last data filed at 07/08/2020 0345 Gross per 24 hour  Intake --  Output 850 ml  Net -850 ml   Filed Weights   07-01-2020 1039  Weight: 108.9 kg    Examination:  Constitutional: No distress on heated high flow but still tachypneic Eyes: no scleral icterus ENMT: mmm Neck: normal, supple Respiratory: Coarse breath sounds bilaterally, tachypneic, no wheezing Cardiovascular: Regular rate and rhythm, no murmurs Abdomen: Soft, NT, ND, bowel sounds positive Musculoskeletal: no clubbing / cyanosis.  Skin: No rashes Neurologic: No focal deficits  Data Reviewed: I have independently reviewed following labs and imaging studies   CBC: Recent Labs  Lab 07/03/20 0359 07/04/20 0253 07/06/20 0147 07/07/20 0137 07/08/20 0207  WBC 11.8* 11.5* 10.8* 9.4 10.4  HGB 15.9 15.2 13.9 14.4 14.1  HCT 48.5 45.8 41.1 41.0 40.1  MCV 91.0 89.6 88.4 87.2 88.1  PLT 163 157 PLATELET CLUMPS NOTED ON SMEAR, UNABLE TO ESTIMATE PLATELET CLUMPS NOTED ON SMEAR, UNABLE TO ESTIMATE 734*   Basic Metabolic Panel: Recent Labs   Lab 07/03/20 0359 07/04/20 0253 07/06/20 0147 07/07/20 0137 07/08/20 0207  NA 137 136 135 136 138  K 5.2* 4.4 4.0 4.2 5.0  CL 103 104 105 102 105  CO2 25 23 22 23 24   GLUCOSE 93 97 226* 200* 283*  BUN 24* 19 13 12 20   CREATININE 0.73 0.65 0.67 0.67 0.85  CALCIUM 8.7* 8.7* 8.5* 8.6* 8.5*   Liver Function Tests: Recent Labs  Lab 07/02/20 0226 07/03/20 0359 07/06/20 0147 07/07/20 0137  AST 20 20 13* 15  ALT 62* 60* 41 43  ALKPHOS 61 53 64 69  BILITOT 1.2 1.3* 1.2 1.5*  PROT 6.3* 5.3* 4.9* 5.9*  ALBUMIN 2.7* 2.6* 2.0* 2.1*   Coagulation Profile: No results for input(s): INR, PROTIME  in the last 168 hours. HbA1C: No results for input(s): HGBA1C in the last 72 hours. CBG: Recent Labs  Lab 07/07/20 0737 07/07/20 1126 07/07/20 1539 07/07/20 2128 07/08/20 0827  GLUCAP 198* 292* 289* 312* 260*    No results found for this or any previous visit (from the past 240 hour(s)).   Radiology Studies: No results found.  Bradley Board, MD, PhD [facility]  Between 7 am - 7 pm I am available, please contact me via Amion or Securechat  Between 7 pm - 7 am I am not available, please contact night coverage MD/APP via Amion

## 2020-07-08 NOTE — Progress Notes (Signed)
OT Cancellation Note  Patient Details Name: LOUAY MYRIE MRN: [id number] DOB: 07-Jul-1940   Cancelled Treatment:    Reason Eval/Treat Not Completed: Patient declined, no reason specified Pt declined exercises or OOB attempts at this time. Will follow-up for OT session as appropriate.  Layla Maw 07/08/2020, 2:23 PM

## 2020-07-08 NOTE — Progress Notes (Signed)
   07/08/20 1357  Clinical Encounter Type  Visited With Patient  Visit Type Initial  Referral From Nurse  Consult/Referral To [place] responded to consult for Mr. Ebron.  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 Gunner.  He sounded surprised about the request, but I asked would he like to have prayer and he stated" it don't hurt" We had prayer and asked if there was anything else I could do, and he said no and thank you.  Chaplain Yasmin Dibello [place] [phone]

## 2020-07-09 DIAGNOSIS — U071 COVID-19: Secondary | ICD-10-CM | POA: Diagnosis not present

## 2020-07-09 DIAGNOSIS — R0603 Acute respiratory distress: Secondary | ICD-10-CM | POA: Diagnosis not present

## 2020-07-09 DIAGNOSIS — R0902 Hypoxemia: Secondary | ICD-10-CM | POA: Diagnosis not present

## 2020-07-09 LAB — GLUCOSE, CAPILLARY
Glucose-Capillary: 228 mg/dL — ABNORMAL HIGH (ref 70–99)
Glucose-Capillary: 266 mg/dL — ABNORMAL HIGH (ref 70–99)
Glucose-Capillary: 140 mg/dL — ABNORMAL HIGH (ref 70–99)
Glucose-Capillary: 240 mg/dL — ABNORMAL HIGH (ref 70–99)

## 2020-07-09 MED ORDER — MORPHINE SULFATE (PF) 2 MG/ML IV SOLN
1.0000 mg | INTRAVENOUS | Status: DC | PRN
  Administered 2020-07-09 – 2020-07-11 (×5): 1 mg via INTRAVENOUS
  Filled 2020-07-09 (×5): qty 1

## 2020-07-09 NOTE — Progress Notes (Signed)
Inpatient Diabetes Program Recommendations  AACE/ADA: New Consensus Statement on Inpatient Glycemic Control (2015)  Target Ranges:  Prepandial:   less than 140 mg/dL      Peak postprandial:   less than 180 mg/dL (1-2 hours)      Critically ill patients:  140 - 180 mg/dL   Lab Results  Component Value Date   GLUCAP 228 (H) 07/09/2020   HGBA1C 5.9 (H) 04/12/2020    Review of Glycemic Control Results for KINTE, TRIM (MRN [id number]) as of 07/09/2020 08:50  Ref. Range 07/08/2020 08:27 07/08/2020 11:56 07/08/2020 16:55 07/08/2020 20:50 07/09/2020 08:26  Glucose-Capillary Latest Ref Range: 70 - 99 mg/dL 260 (H) 273 (H) 229 (H) 302 (H) 228 (H)    Current orders for Inpatient glycemic control:  Lantus 10 units QHS Novolog 0-15 units TID & 0-5 units QHS Novolog 5 units TID with meals  Inpatient Diabetes Program Recommendations:     Lantus 12 units QHS Novolog 7 units TID with meals  Will continue to follow while inpatient.  Thank you, Reche Dixon, RN, BSN Diabetes Coordinator Inpatient Diabetes Program [phone] (team pager from 8a-5p)

## 2020-07-09 NOTE — Progress Notes (Signed)
Pt had Plum off of face when RT entered room. Pt clearly was SOB and having trouble properly taking morning inhalers. FI02 was increased to 100% to help and pt was instructed to breathe in through his nose and out of his mouth. MD at bedside to witness. RT will monitor pt closely throughout day.

## 2020-07-09 NOTE — Progress Notes (Signed)
OT Cancellation Note  Patient Details Name: Bradley French MRN: [id number] DOB: 10-06-1940   Cancelled Treatment:    Reason Eval/Treat Not Completed: Medical issues which prohibited therapy;Patient not medically ready discussed with RN and PT who stated pt not medically appropriate as maxed out on Heated high flow at 100% and decompensated to 70% with rolling with prolonged and difficult recovery. Noted palliative consult. Will follow-up for therapy session as medically appropriate  Layla Maw 07/09/2020, 11:23 AM

## 2020-07-09 NOTE — Progress Notes (Signed)
PROGRESS NOTE  Bradley French IFO:[id number] DOB: 06-09-1940 DOA: 06/15/2020 PCP: [facility]   LOS: 25 days   Brief Narrative / Interim history:  80 y.o.malewith medical history significant ofCAD s/p CABG in 2006, HTN, IIDM, OSA on CPAP,COPD, presented with cough and increasing short of breath. Patient started to have symptoms of dry cough and shortness of breath 6 days ago, and he went to possible COVID-19 5 days ago. He also had subjective fever and chills, loss of appetite but no abdominal pain or diarrhea. He has not been eating or drinking much for the last 3 to 4 days. He was vaccinated for COVID x 2. Went to see his PCP who checked his pulse ox in the office and found low and told him to come to ED. COVID-positive with chest x-ray showed bilateral multifocal infiltrates. 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 He is currently on heated high flow high percent FiO2, goals of care discussions have been held and he is DNR. If he is to get worse family accepting that he will need comfort care measures  Subjective / 24h Interval events: More short of breath this morning, on 100% FiO2 as well as nonrebreather. Tachypneic  Assessment & Plan: Principal Problem Acute hypoxic respiratory failure due to COVID-19 pneumonia, pneumomediastinum, concern for HCAP-has been treated with baricitinib, Remdesivir, steroids.  Despite aggressive treatment his respiratory status is not improving more than 3 weeks out from his admission, suggesting a very poor prognosis.  Over the last couple of days he has gotten worse and now he is on heated high flow. Back on a steroid trial 2/5-2/7, no improvement -CT angiogram with findings of small to moderate pneumomediastinum, chest x-ray 2/5 sooner with prior -Completed Vanco cefepime for 7 days, last dose of cefepime 2/6 -Getting  worse, likely will pass in the hospital. I have consulted palliative care to assist during this difficult times and transitioning to the dying process  Active Problems Type 2 diabetes mellitus-continue Lantus, NovoLog.  Monitor  CBG (last 3)  Recent Labs    07/08/20 1655 07/08/20 2050 07/09/20 0826  GLUCAP 229* 302* 80*    Deconditioning, ambulatory dysfunction, POA -PT recommended SNF but he is getting worse  Essential hypertension -Continue home regimen, blood pressure is stable  Goals of care -Discussing with the wife daily.  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  Wife relates that he has been a long term smoker, has known lung disease and emphysematous changes and he has continued to smoke up until he got sick.  She also mentioned that he has been drinking heavily until last month when he was hospitalized.  Continue DNR/DNI, oxygen, supportive care short of moving to the ICU/intubation/code. If he is to decline will contact family but best to proceed with comfort measures. She is starting to accept this  Scheduled Meds: . (feeding supplement) PROSource Plus  30 mL Oral TID BM  . amLODipine  10 mg Oral Q2000  . aspirin EC  81 mg Oral Q2000  . diclofenac Sodium  2 g Topical Q2000  . dronabinol  2.5 mg Oral BID AC  . enoxaparin (LOVENOX) injection  40 mg Subcutaneous Q12H  . feeding supplement (GLUCERNA SHAKE)  237 mL Oral QID  .  insulin aspart  0-15 Units Subcutaneous TID WC  . insulin aspart  0-5 Units Subcutaneous QHS  . insulin aspart  5 Units Subcutaneous TID WC  . insulin glargine  10 Units Subcutaneous QHS  . Ipratropium-Albuterol  1 puff Inhalation Q6H  . mometasone-formoterol  2 puff Inhalation BID  . multivitamin with minerals  1 tablet Oral Daily  . nicotine  21 mg Transdermal Daily  . polyethylene  glycol  17 g Oral BID   Continuous Infusions:  PRN Meds:.acetaminophen, albuterol, guaiFENesin-dextromethorphan, ondansetron **OR** ondansetron (ZOFRAN) IV  Diet Orders (From admission, onward)    Start     Ordered   06/27/20 1225  Diet regular Room service appropriate? Yes; Fluid consistency: Thin  Diet effective now       Question Answer Comment  Room service appropriate? Yes   Fluid consistency: Thin      06/27/20 1224          DVT prophylaxis: enoxaparin (LOVENOX) injection 40 mg Start: 06/21/20 1100     Code Status: DNR  Family Communication: Wife at bedside  Status is: Inpatient  Remains inpatient appropriate because:Inpatient level of care appropriate due to severity of illness   Dispo: The patient is from: Home              Anticipated d/c is to: SNF or perhaps in hospital death              Anticipated d/c date is: > 3 days              Patient currently is not medically stable to d/c.   Difficult to place patient No  Level of care: Telemetry Medical  Consultants:  Pulmonary   Procedures:  none  Microbiology  none  Antimicrobials: Vancomycin / Cefepime 2/1 >> 2/6   Objective: Vitals:   07/08/20 2052 07/09/20 0309 07/09/20 0542 07/09/20 0735  BP: 113/65  118/73   Pulse: 93  83 (!) 103  Resp: 18  (!) 21   Temp: 98 F (36.7 C)  98.1 F (36.7 C)   TempSrc: Oral  Oral   SpO2: 95% 90% (!) 88% (!) 85%  Weight:      Height:        Intake/Output Summary (Last 24 hours) at 07/09/2020 1022 Last data filed at 07/08/2020 1700 Gross per 24 hour  Intake --  Output 350 ml  Net -350 ml   Filed Weights   06/28/2020 1039  Weight: 108.9 kg    Examination:  Constitutional: Tachypneic, Eyes: No icterus ENMT: mmm Neck: normal, supple Respiratory: Coarse breath sounds bilaterally, shallow respirations, no wheezing Cardiovascular: Regular rate and rhythm, no murmurs Abdomen: Soft, nontender, nondistended, bowel sounds positive Musculoskeletal: no  clubbing / cyanosis.  Skin: No rashes seen Neurologic: Nonfocal, equal strength  Data Reviewed: I have independently reviewed following labs and imaging studies   CBC: Recent Labs  Lab 07/03/20 0359 07/04/20 0253 07/06/20 0147 07/07/20 0137 07/08/20 0207  WBC 11.8* 11.5* 10.8* 9.4 10.4  HGB 15.9 15.2 13.9 14.4 14.1  HCT 48.5 45.8 41.1 41.0 40.1  MCV 91.0 89.6 88.4 87.2 88.1  PLT 163 157 PLATELET CLUMPS NOTED ON SMEAR, UNABLE TO ESTIMATE PLATELET CLUMPS NOTED ON SMEAR, UNABLE TO ESTIMATE 998*   Basic Metabolic Panel: Recent Labs  Lab 07/03/20 0359 07/04/20 0253 07/06/20 0147 07/07/20 0137 07/08/20 0207  NA 137 136 135 136 138  K 5.2* 4.4 4.0 4.2 5.0  CL 103 104 105 102 105  CO2 25 23  22 23 24   GLUCOSE 93 97 226* 200* 283*  BUN 24* 19 13 12 20   CREATININE 0.73 0.65 0.67 0.67 0.85  CALCIUM 8.7* 8.7* 8.5* 8.6* 8.5*   Liver Function Tests: Recent Labs  Lab 07/03/20 0359 07/06/20 0147 07/07/20 0137  AST 20 13* 15  ALT 60* 41 43  ALKPHOS 53 64 69  BILITOT 1.3* 1.2 1.5*  PROT 5.3* 4.9* 5.9*  ALBUMIN 2.6* 2.0* 2.1*   Coagulation Profile: No results for input(s): INR, PROTIME in the last 168 hours. HbA1C: No results for input(s): HGBA1C in the last 72 hours. CBG: Recent Labs  Lab 07/08/20 0827 07/08/20 1156 07/08/20 1655 07/08/20 2050 07/09/20 0826  GLUCAP 260* 273* 229* 302* 228*    No results found for this or any previous visit (from the past 240 hour(s)).   Radiology Studies: No results found.  Marzetta Board, MD, PhD [facility]  Between 7 am - 7 pm I am available, please contact me via Amion or Securechat  Between 7 pm - 7 am I am not available, please contact night coverage MD/APP via Amion

## 2020-07-09 NOTE — Progress Notes (Signed)
PT Cancellation Note  Patient Details Name: Bradley French MRN: [id number] DOB: Dec 01, 1940   Cancelled Treatment:    Reason Eval/Treat Not Completed: Patient not medically ready (discussed with RN who stated pt not medically appropriate as maxed out on Heated high flow at 100% and decompensated to 70% with rolling with prolonged and difficult recovery)   Letha Mirabal B Shamicka Inga 07/09/2020, 11:06 AM  [place] Pager: [phone] Office: [phone]

## 2020-07-10 DIAGNOSIS — Z515 Encounter for palliative care: Secondary | ICD-10-CM | POA: Diagnosis not present

## 2020-07-10 DIAGNOSIS — U071 COVID-19: Secondary | ICD-10-CM | POA: Diagnosis not present

## 2020-07-10 DIAGNOSIS — R0902 Hypoxemia: Secondary | ICD-10-CM | POA: Diagnosis not present

## 2020-07-10 DIAGNOSIS — R0600 Dyspnea, unspecified: Secondary | ICD-10-CM

## 2020-07-10 DIAGNOSIS — J9601 Acute respiratory failure with hypoxia: Secondary | ICD-10-CM | POA: Diagnosis not present

## 2020-07-10 DIAGNOSIS — R0603 Acute respiratory distress: Secondary | ICD-10-CM | POA: Diagnosis not present

## 2020-07-10 LAB — BASIC METABOLIC PANEL
Anion gap: 9 (ref 5–15)
BUN: 18 mg/dL (ref 8–23)
CO2: 25 mmol/L (ref 22–32)
Calcium: 8.8 mg/dL — ABNORMAL LOW (ref 8.9–10.3)
Chloride: 105 mmol/L (ref 98–111)
Creatinine, Ser: 0.55 mg/dL — ABNORMAL LOW (ref 0.61–1.24)
GFR, Estimated: >60 mL/min (ref >60)
Glucose, Bld: 136 mg/dL — ABNORMAL HIGH (ref 70–99)
Potassium: 4.8 mmol/L (ref 3.5–5.1)
Sodium: 139 mmol/L (ref 135–145)

## 2020-07-10 LAB — CBC
HCT: 42.4 % (ref 39.0–52.0)
Hemoglobin: 14.6 g/dL (ref 13.0–17.0)
MCH: 30.6 pg (ref 26.0–34.0)
MCHC: 34.4 g/dL (ref 30.0–36.0)
MCV: 88.9 fL (ref 80.0–100.0)
Platelets: 137 10*3/uL — ABNORMAL LOW (ref 150–400)
RBC: 4.77 MIL/uL (ref 4.22–5.81)
RDW: 14 % (ref 11.5–15.5)
WBC: 8 10*3/uL (ref 4.0–10.5)
nRBC: 0 % (ref 0.0–0.2)

## 2020-07-10 LAB — GLUCOSE, CAPILLARY
Glucose-Capillary: 202 mg/dL — ABNORMAL HIGH (ref 70–99)
Glucose-Capillary: 207 mg/dL — ABNORMAL HIGH (ref 70–99)

## 2020-07-10 MED ORDER — MORPHINE BOLUS VIA INFUSION
2.0000 mg | INTRAVENOUS | Status: DC | PRN
Start: 2020-07-10 — End: 2020-07-12
  Administered 2020-07-10 (×2): 4 mg via INTRAVENOUS
  Administered 2020-07-10: 2 mg via INTRAVENOUS
  Administered 2020-07-10 – 2020-07-11 (×7): 4 mg via INTRAVENOUS
  Filled 2020-07-10: qty 4

## 2020-07-10 MED ORDER — INSULIN GLARGINE 100 UNIT/ML ~~LOC~~ SOLN
12.0000 [IU] | Freq: Every day | SUBCUTANEOUS | Status: DC
  Filled 2020-07-10: qty 0.12

## 2020-07-10 MED ORDER — BISACODYL 10 MG RE SUPP: 10.0000 mg | Freq: Every day | RECTAL | Status: DC | PRN

## 2020-07-10 MED ORDER — INSULIN ASPART 100 UNIT/ML ~~LOC~~ SOLN
7.0000 [IU] | Freq: Three times a day (TID) | SUBCUTANEOUS | Status: DC
  Administered 2020-07-10: 7 [IU] via SUBCUTANEOUS

## 2020-07-10 MED ORDER — ACETAMINOPHEN 650 MG RE SUPP: 650.0000 mg | Freq: Four times a day (QID) | RECTAL | Status: DC | PRN

## 2020-07-10 MED ORDER — MORPHINE 100MG IN NS 100ML (1MG/ML) PREMIX INFUSION
1.0000 mg/h | INTRAVENOUS | Status: DC
  Administered 2020-07-10: 1 mg/h via INTRAVENOUS
  Administered 2020-07-12: 1.5 mg/h via INTRAVENOUS
  Filled 2020-07-10 (×2): qty 100

## 2020-07-10 MED ORDER — GLYCOPYRROLATE 0.2 MG/ML IJ SOLN
0.2000 mg | INTRAMUSCULAR | Status: DC | PRN
  Administered 2020-07-11 (×2): 0.2 mg via INTRAVENOUS
  Filled 2020-07-10 (×2): qty 1

## 2020-07-10 MED ORDER — LORAZEPAM 2 MG/ML IJ SOLN
1.0000 mg | INTRAMUSCULAR | Status: DC | PRN
  Administered 2020-07-10 – 2020-07-12 (×4): 1 mg via INTRAVENOUS
  Filled 2020-07-10 (×4): qty 1

## 2020-07-10 NOTE — Plan of Care (Signed)

## 2020-07-10 NOTE — Progress Notes (Signed)
This chaplain responded to PMT consult for EOL spiritual care. The Pt. wife-Bradley French and son-Bradley Jr. are present bedside with the Pt.  Bradley French stepped away from the bedside and asked the chaplain to ask the Pt. about his salvation. The chaplain offered the opportunity for a community clergy visit and declined by Bradley French.   The chaplain recognized Bradley French's relationship with the Pt. and educated Bradley French on often occurring EOL spirituality, before returning to the room and asking the Pt. if he would like a chaplain visit.  The Pt. declined the visit.  This chaplain is available for F/U spiritual care as needed.

## 2020-07-10 NOTE — Progress Notes (Signed)
PROGRESS NOTE    Bradley French  ULA:[id number] DOB: [birth year] DOA: 06/27/2020 PCP: [facility]  Brief Narrative:  Patient is a 80 year old chronically ill-appearing African-American male with past medical history significant for barometer to CAD status post CABG 2006, hypertension, diabetes mellitus type 2, OSA on CPAP, COPD as well as other comorbidities presents with cough and increasing shortness of breath.  He started to have symptoms of a dry cough and shortness with 6 days ago on high possible cold exposure 5 days prior.  He also had subjective fevers and chills and loss of appetite but no abdominal pain or diarrhea.  He has been vaccinated for Covid x2.  He went to see his PCP was checked his pulse oximetry and was found to be low and told to go to the ED.  In the ED is Covid positive chest x-ray findings showing bilateral multifocal infiltrates.  He was aggressively treated and is status post baricitinib, remdesivir of steroids but continued to persist to have high oxygen requirements. Patient temporarily improved but over the last week or so has been slowly getting worse suspecting a fibrotic stage of ARDS.  He continued to have significant amount of oxygen on heated high flow nasal cannula and after continued goals of care discussion he was changed to DNR.  Patient continued to worsen and has now been transitioned to comfort measures  Assessment & Plan:   Active Problems:   COVID-19 virus infection   COVID-19   Acute respiratory failure with hypoxia (HCC)   Hypoxia   Acute respiratory distress  Acute hypoxic respiratory failure due to COVID-19 pneumonia, pneumomediastinum, concern for HCAP -Has been treated with baricitinib, Remdesivir, steroids.  Despite aggressive treatment his respiratory status is not improving more than 3 weeks out from his admission, suggesting a very poor prognosis.  -Over the last couple of days he has gotten worse and now he is on heated high  flow. He was placed Back on a steroid trial 2/5-2/7, no improvement -No results for input(s): DDIMER, FERRITIN, LDH, CRP in the last 72 hours. Last CRP was checked on 07/06/20 and was 27.4 and had previously resolved   Lab Results  Component Value Date   [place] NEGATIVE 04/12/2020   -SpO2: (!) 87 % O2 Flow Rate (L/min): (S) 40 L/min FiO2 (%): (S) 100 % -CT angiogram with findings of small to moderate pneumomediastinum, chest x-ray 2/5 sooner with prior -Completed Vanco cefepime for 7 days, last dose of cefepime 2/6 -Patient continues to be get worse and was uncomfortable despite being on HFNC, likely will pass in the hospital. Palliative care to assist during this difficult times and transitioning to the dying process and likely will have an in hospital death given poor prognosis  -ALL medications not In the patient's comfort will be discontinued. He will remain on Acetaminophen for Mild Pain of Fever, Albuterol nebs, Bisacodyl Suppository, Glycopyrrolate, Lorazepam, Morphine gtt with IV Breakthrough 2-4 mg, Nicotine Patch, Ondansetron [facility] Death given poor Prognosis and High Risk for Further Decompensation  End of Life/Terminal Care -See Above  Type 2 Diabetes Mellitus -Continued Lantus but increase the Dose from 10 units sq qHS to 12 units, Increase Novolog from 5 units TIDwm to 7 unuts and C/w Moderate NovoLog SSI AC/HS. -Continue to Monitor CBG's Carefully and adjust Insulin as Necessary  -CBGs ranging from 140-302 -Diabetes Education Coordinator Following and appreciate Reccomendations -Will not Check any more CBG's as he is being transitioned to Comfort Measures  Thrombocytopenia  -Platelet  Count has dropped and gone from 163 -> 123 -> 137 -Continue to Monitor For S/Sx of bleeding  -Will not repeat CBC as patient will be transitioning to [place], ambulatory dysfunction, POA -PT recommended SNF but he is getting worse and will not purse  as Anticipated Hospital Death is expected.  Essential Hypertension -Was continuing home regimen but will be transitioned to Comfort measures and BP medications will stop, blood pressure is stable  Goals of care -Discussing with the wife daily.  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  Wife relates that he has been a long term smoker, has known lung disease and emphysematous changes and he has continued to smoke up until he got sick.  She also mentioned that he has been drinking heavily until last month when he was hospitalized.  Continue DNR/DNI, oxygen, supportive care short of moving to the ICU/intubation/code. Since he has made no improvement and continues to worsen despite adequate treatment a decision was made to transition the patient to comfort Measures after discussion with Palliative    DVT prophylaxis: Enoxaparin 40 mg sq q24h Code Status: DO NOT RESUSCITATE  Family Communication: No family present at bedside  Disposition Plan: Anticipated In-Hospital Death  Status is: Inpatient  Remains inpatient appropriate because:Unsafe d/c plan, IV treatments appropriate due to intensity of illness or inability to take PO and Inpatient level of care appropriate due to severity of illness   Dispo: The patient is from: Home              Anticipated d/c is to: In Hospital Death              Anticipated d/c date is:1-2 days              Patient currently is not medically stable to d/c.   Difficult to place patient No   Consultants:   Palliative Care   Pulmonary Critical Care  Procedures: None  Antimicrobials:  Anti-infectives (From admission, onward)   Start     Dose/Rate Route Frequency Ordered Stop   07/03/20 0615  vancomycin (VANCOREADY) IVPB 1500 mg/300 mL  Status:  Discontinued        1,500 mg 150 mL/hr over 120  Minutes Intravenous Every 12 hours 07/02/20 1743 07/04/20 1406   07/02/20 1815  vancomycin (VANCOCIN) 2,250 mg in sodium chloride 0.9 % 500 mL IVPB        2,250 mg 250 mL/hr over 120 Minutes Intravenous  Once 07/02/20 1743 07/02/20 2055   07/02/20 1800  ceFEPIme (MAXIPIME) 2 g in sodium chloride 0.9 % 100 mL IVPB        2 g 200 mL/hr over 30 Minutes Intravenous Every 8 hours 07/02/20 1743 07/07/20 1207   06/19/20 1100  remdesivir 100 mg in sodium chloride 0.9 % 100 mL IVPB        100 mg 200 mL/hr over 30 Minutes Intravenous  Once 06/19/20 1003 06/20/20 0859   06/15/20 1000  remdesivir 100 mg in sodium chloride 0.9 % 100 mL IVPB       "Followed by" Linked Group Details   100 mg 200 mL/hr over 30 Minutes Intravenous Daily 06/17/2020 1912 06/19/20 0959   06/03/2020 2100  remdesivir 200 mg in sodium chloride 0.9% 250 mL IVPB       "Followed by" Linked Group Details  200 mg 580 mL/hr over 30 Minutes Intravenous Once 06/23/2020 1912 06/24/2020 2242        Subjective: And examined at bedside and he was still very dyspneic and complained of shortness of breath and did not feel very well.  Was uncomfortable.  Denies any chest pain currently or abdominal pain.  No lightheadedness or dizziness.  No other concerns or complaints at this time   Objective: Vitals:   07/09/20 2206 07/10/20 0128 07/10/20 0618 07/10/20 0749  BP: 122/66  119/70   Pulse: 90 76 87 81  Resp: 20 18 18  (!) 30  Temp: 98.8 F (37.1 C)  97.7 F (36.5 C)   TempSrc: Axillary  Oral   SpO2: 95% 90% 94% (!) 87%  Weight:      Height:        Intake/Output Summary (Last 24 hours) at 07/10/2020 0825 Last data filed at 07/10/2020 0086 Gross per 24 hour  Intake --  Output 800 ml  Net -800 ml   Filed Weights   06/17/2020 1039  Weight: 108.9 kg   Examination: Physical Exam:  Constitutional: WN/WD elderly chronically ill appearing AAM in some Respiratory distress Eyes: PERRL, lids and conjunctivae normal, sclerae anicteric  ENMT:  External Ears, Nose appear normal. Grossly normal hearing.  Neck: Appears normal, supple, no cervical masses, normal ROM, no appreciable thyromegaly; No JVD Respiratory: Diminished to auscultation bilaterally with coarse breath sounds, no wheezing, rales, rhonchi or crackles.  He has increased work of breathing and is wearing very high amount of oxygen. Cardiovascular: RRR, no murmurs / rubs / gallops. S1 and S2 auscultated.  Minimal extremity edema Abdomen: Soft, non-tender, non-distended. No masses palpated.  GU: Deferred. Musculoskeletal: No clubbing / cyanosis of digits/nails. No joint deformity upper and lower extremities.  Skin: No rashes, lesions, ulcers on limited skin evaluation. No induration; Warm and dry.  Neurologic: CN 2-12 grossly intact with no focal deficits. Romberg sign and cerebellar reflexes not assessed.  Psychiatric: Normal judgment and insight. Alert and oriented x 3.  Anxious mood and appropriate affect.   Data Reviewed: I have personally reviewed following labs and imaging studies  CBC: Recent Labs  Lab 07/04/20 0253 07/06/20 0147 07/07/20 0137 07/08/20 0207 07/10/20 0243  WBC 11.5* 10.8* 9.4 10.4 8.0  HGB 15.2 13.9 14.4 14.1 14.6  HCT 45.8 41.1 41.0 40.1 42.4  MCV 89.6 88.4 87.2 88.1 88.9  PLT 157 PLATELET CLUMPS NOTED ON SMEAR, UNABLE TO ESTIMATE PLATELET CLUMPS NOTED ON SMEAR, UNABLE TO ESTIMATE 123* 761*   Basic Metabolic Panel: Recent Labs  Lab 07/04/20 0253 07/06/20 0147 07/07/20 0137 07/08/20 0207 07/10/20 0243  NA 136 135 136 138 139  K 4.4 4.0 4.2 5.0 4.8  CL 104 105 102 105 105  CO2 23 22 23 24 25   GLUCOSE 97 226* 200* 283* 136*  BUN 19 13 12 20 18   CREATININE 0.65 0.67 0.67 0.85 0.55*  CALCIUM 8.7* 8.5* 8.6* 8.5* 8.8*   GFR: Estimated Creatinine Clearance: 102.9 mL/min (A) (by C-G formula based on SCr of 0.55 mg/dL (L)). Liver Function Tests: Recent Labs  Lab 07/06/20 0147 07/07/20 0137  AST 13* 15  ALT 41 43  ALKPHOS 64 69   BILITOT 1.2 1.5*  PROT 4.9* 5.9*  ALBUMIN 2.0* 2.1*   No results for input(s): LIPASE, AMYLASE in the last 168 hours. No results for input(s): AMMONIA in the last 168 hours. Coagulation Profile: No results for input(s): INR, PROTIME in the last 168 hours. Cardiac Enzymes: No  results for input(s): CKTOTAL, CKMB, CKMBINDEX, TROPONINI in the last 168 hours. BNP (last 3 results) No results for input(s): PROBNP in the last 8760 hours. HbA1C: No results for input(s): HGBA1C in the last 72 hours. CBG: Recent Labs  Lab 07/09/20 0826 07/09/20 1202 07/09/20 1647 07/09/20 2208 07/10/20 0810  GLUCAP 228* 266* 140* 240* 202*   Lipid Profile: No results for input(s): CHOL, HDL, LDLCALC, TRIG, CHOLHDL, LDLDIRECT in the last 72 hours. Thyroid Function Tests: No results for input(s): TSH, T4TOTAL, FREET4, T3FREE, THYROIDAB in the last 72 hours. Anemia Panel: No results for input(s): VITAMINB12, FOLATE, FERRITIN, TIBC, IRON, RETICCTPCT in the last 72 hours. Sepsis Labs: No results for input(s): PROCALCITON, LATICACIDVEN in the last 168 hours.  No results found for this or any previous visit (from the past 240 hour(s)).   RN Pressure Injury Documentation:     Estimated body mass index is 29.21 kg/m as calculated from the following:   Height as of this encounter: 6\' 4"  (1.93 m).   Weight as of this encounter: 108.9 kg.  Malnutrition Type:  Nutrition Problem: Increased nutrient needs Etiology: acute illness (COVID PNA)  Malnutrition Characteristics:  Signs/Symptoms: estimated needs  Nutrition Interventions:  Interventions: Ensure Enlive (each supplement provides 350kcal and 20 grams of protein)   Radiology Studies: No results found.   Scheduled Meds: . (feeding supplement) PROSource Plus  30 mL Oral TID BM  . amLODipine  10 mg Oral Q2000  . aspirin EC  81 mg Oral Q2000  . diclofenac Sodium  2 g Topical Q2000  . dronabinol  2.5 mg Oral BID AC  . enoxaparin (LOVENOX)  injection  40 mg Subcutaneous Q12H  . feeding supplement (GLUCERNA SHAKE)  237 mL Oral QID  . insulin aspart  0-15 Units Subcutaneous TID WC  . insulin aspart  0-5 Units Subcutaneous QHS  . insulin aspart  5 Units Subcutaneous TID WC  . insulin glargine  10 Units Subcutaneous QHS  . Ipratropium-Albuterol  1 puff Inhalation Q6H  . mometasone-formoterol  2 puff Inhalation BID  . multivitamin with minerals  1 tablet Oral Daily  . nicotine  21 mg Transdermal Daily  . polyethylene glycol  17 g Oral BID   Continuous Infusions:   LOS: 26 days   Kerney Elbe, DO [facility] PAGER is on AMION  If 7PM-7AM, please contact night-coverage [email or web address]

## 2020-07-10 NOTE — Consult Note (Signed)
Consultation Note Date: 07/10/2020   Patient Name: Bradley French  DOB: 09/10/1940  MRN: [id number]  Age / Sex: 80 y.o., male  PCP: [facility] Referring Physician: Kerney Elbe, DO  Reason for Consultation: Establishing goals of care and Terminal Care  HPI/Patient Profile: 80 y.o. male  with past medical history of COPD, CAD s/p CABG 2006, HTN, DM type 2, OSA on CPAP, depression, anxiety, HLD, smoker admitted on 06/13/2020 with shortness of breath and hypoxia from PCP. COVID positive. CXR reveals bilateral multifocal infiltrates. Patient has completed baricitinib, remdesivir, steroids. Unfortunately, respiratory status continues to worsen despite maximum therapy currently on HFNC 100% FIO2 and 15L NRB mask. 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 After conversations with wife, decision made for DNR/DNI code status. Palliative medicine consultation for terminal care.   Clinical Assessment and Goals of Care:  I have reviewed medical records, discussed with care team, and met with wife Blanch Media) in family conference room to discuss goals of care/EOL care.   I introduced Palliative Medicine as specialized medical care for people living with serious illness. It focuses on providing relief from the symptoms and stress of a serious illness.   We discussed a brief life review of the patient. Married with three children and two grandchildren. Blanch Media reports that her husband was active prior to admission but multiple underlying co-morbidities including history of heart condition, emphysema, DM. Also still regularly drinking ETOH and smoking prior to admission.    Discussed course of hospitalization including diagnoses, interventions, plan of care. Frankly and compassionately discussed poor prognosis and concern that Ritchie will not make it out of the hospital. Blanch Media has had a few days to process conversations  with Dr. Silas Flood and Dr. Cruzita Lederer. She clearly understands her husband's condition and poor prognosis. Blanch Media has also prepared the family who have visited in the last few days.   Wife confirms decision for DNR/DNI code status   Discussed medical recommendation for shift to comfort measures as he appears to be struggling this morning despite high amounts of oxygen. Discussed shift to comfort in order to allow better control of symptom management, allow family to visit, and peace and comfort as he nears EOL. Wife understands and agrees with shift to comfort. She states her wishes to make him "as comfortable as possible." She acknowledges he will pass away in the hospital.   Discussed symptom management medications including initiation of morphine infusion to ensure comfort. Wife agrees. Also discussed comfort feeds, chaplain visit, and unrestricted visitor access.   Prepared Ms. Cedano for anything to happen at any time. She is hopeful the children will visit again today.   Therapeutic listening. Emotional/spiritual support provided. PMT contact information given.   **Updated Dr. Alfredia Ferguson and RN.     SUMMARY OF RECOMMENDATIONS    GOC discussion with patient's wife. She understands Eh's condition and poor prognosis. She has also prepared their three children of prognosis.   Transition to comfort measures only.   Discontinue interventions not aimed at comfort.   Symptom management--see  below.   Comfort feeds per patient/family request. Aspiration precautions.   Chaplain consult for prayer and support.   Unrestricted visitor access.   RN can start weaning NRB mask once Idus is more comfortable on morphine gtt. Continue current HFNC at this time as family may visit this afternoon. I have prepared wife that he could pass on high amounts of oxygen. (Will have further discussions of prolonging nature of oxygen if necessary).  Anticipate hospital death.   Code Status/Advance Care  Planning:  DNR/DNI  Symptom Management:   Initiate morphine infusion 26m/hr  RN may bolus via infusion 2-433mIV q1572mprn pain/dyspnea/air hunger/tachypnea  Ativan 1mg78m q4h prn anxiety  Robinul 0.2mg 46mq4h prn secretions  Dulcolax PR prn constipation  Tylenol PR prn fever >101  Palliative Prophylaxis:   Aspiration, Bowel Regimen, Delirium Protocol, Frequent Pain Assessment, Oral Care and Turn Reposition  Additional Recommendations (Limitations, Scope, Preferences):  Full Comfort Care  Psycho-social/Spiritual:   Desire for further Chaplaincy support:yes  Additional Recommendations: Caregiving  Support/Resources, Compassionate Wean Education and Education on Hospice  Prognosis:   Very poor likely days if not hours with severe acute respiratory failure secondary to ARDS/COVID fibrosis with underlying COPD and multiple co-morbidities  Discharge Planning: Anticipated Hospital Death      Primary Diagnoses: Present on Admission: . COVID-19   I have reviewed the medical record, interviewed the patient and family, and examined the patient. The following aspects are pertinent.  Past Medical History:  Diagnosis Date  . Anxiety   . Benign hematuria June '08   CT abdomen negative[facility]   . Colon polyps ???   none on 2006 colonoscopy  . COPD (chronic obstructive pulmonary disease) (HCC) [place] Coronary artery disease   . Depression    [facility]  . Diabetes mellitus without complication (HCC) [place] Diverticulosis of colon   . Gastric ulcer 2006   ? secondary to H Pylori, vs ASA.   . GERMarland Kitchen (gastroesophageal reflux disease)   . H pylori ulcer 2006   treated with Prevpac  . Hemorrhoid   . Hypercholesteremia   . Hypertension   . Mental disorder   . OA (osteoarthritis) of knee    left knee: tricompartmental deterioration. s/p steroid injections  . [place]  . Marital status: Married    Spouse name: Not on file  . Number of children: 3  . Years of education: 11  .14ighest education level: Not on file  Occupational History  . Occupation: sheetEditor, commissioningomment: retired  Tobacco Use  . Smoking status: Current Every Day Smoker    Packs/day: 0.25    Years: 45.00    Pack years: 11.25    Types: Cigarettes  . Smokeless tobacco: Never Used  Substance and Sexual Activity  . Alcohol use: Yes    Alcohol/week: 6.0 standard drinks    Types: 6 Standard drinks or equivalent per week    Comment: brandy   . Drug use: No  . Sexual activity: Never  Other Topics Concern  . Not on file  Social History Narrative   11th grade. [employer] - 2 years, 3 years [employer]. 1 tour [place] PTSD. Married '88  2 sons '69, '70; 1 daughter '71. 2 grandchildren. Sheet metalProbation officertired.  Social Determinants of Health   Financial Resource Strain: Not on file  Food Insecurity: Not on file  Transportation Needs: Not on file  Physical Activity: Not on file  Stress: Not on file  Social Connections: Not on file   Family History  Problem Relation Age of Onset  . Diabetes Brother   . Alcohol abuse Father   . Alcohol abuse Sister   . Diabetes Brother   . Alcohol abuse Brother   . Cancer Neg Hx   . Heart disease Neg Hx    Scheduled Meds: . diclofenac Sodium  2 g Topical Q2000  . nicotine  21 mg Transdermal Daily   Continuous Infusions: . morphine 1 mg/hr (07/10/20 1341)   PRN Meds:.acetaminophen, acetaminophen, albuterol, bisacodyl, glycopyrrolate, guaiFENesin-dextromethorphan, LORazepam, morphine injection, morphine, ondansetron **OR** ondansetron (ZOFRAN) IV Medications Prior to Admission:  Prior to Admission medications   Medication Sig Start Date End Date Taking? Authorizing Provider  amLODipine (NORVASC) 10 MG tablet Take 1 tablet (10 mg total) by mouth daily. 10/07/13  Yes Thurnell Lose, MD  aspirin  EC 81 MG tablet Take 81 mg by mouth daily.   Yes [provider]  Aspirin-Caffeine (BC FAST PAIN RELIEF ARTHRITIS) 1000-65 MG PACK Take 1 Package by mouth in the morning and at bedtime.   Yes [provider]  diclofenac Sodium (VOLTAREN) 1 % GEL Apply 2 g topically daily as needed (For pain).   Yes [provider]  furosemide (LASIX) 20 MG tablet Take 1 tablet (20 mg total) by mouth daily. 12/21/18  Yes Quintella Reichert, MD  Ipratropium-Albuterol (COMBIVENT RESPIMAT IN) Inhale 1 puff into the lungs 4 (four) times daily as needed.   Yes [provider]  losartan (COZAAR) 50 MG tablet Take 25 mg by mouth daily.   Yes [provider]  metFORMIN (GLUCOPHAGE) 500 MG tablet Take 500 mg by mouth daily after breakfast.   Yes [provider]  metoprolol tartrate (LOPRESSOR) 50 MG tablet Take 50 mg by mouth 2 (two) times daily.   Yes [provider]  potassium chloride SA (KLOR-CON) 20 MEQ tablet Take 2 tablets (40 mEq total) by mouth 2 (two) times daily. 04/12/20  Yes Magnant, Gerrianne Scale, PA-C   Allergies  Allergen Reactions  . Sulfonamide Derivatives Rash   Review of Systems  Unable to perform ROS: Acuity of condition   Physical Exam Vitals and nursing note reviewed.  Constitutional:      Appearance: He is ill-appearing.  HENT:     Head: Normocephalic and atraumatic.  Cardiovascular:     Rate and Rhythm: Normal rate.  Pulmonary:     Effort: Tachypnea and accessory muscle usage present.     Breath sounds: Decreased breath sounds present.     Comments: [place] with wife. Transition to comfort--morphine gtt. Currently requiring 100% FIO2 on HFNC and 15L NRB. Desatted to 60's this AM per RN. Skin:    General: Skin is warm and dry.  Neurological:     Mental Status: He is easily aroused.     Comments: Drowsy, uncomfortable  Psychiatric:        Attention and Perception: He is inattentive.        Speech: Speech is delayed.    Vital  Signs: BP 119/70 (BP Location: Right Arm)   Pulse 81   Temp 97.7 F (36.5 C) (Oral)   Resp (!) 30   Ht _0  (1.93 m)   Wt 108.9 kg   SpO2 (!) 87%  BMI 29.21 kg/m  Pain Scale: 0-10 POSS *See Group Information*: 1-Acceptable,Awake and alert Pain Score: 0-No pain   SpO2: SpO2: (!) 87 % O2 Device:SpO2: (!) 87 % O2 Flow Rate: .O2 Flow Rate (L/min): 40 L/min  IO: Intake/output summary:   Intake/Output Summary (Last 24 hours) at 07/10/2020 1426 Last data filed at 07/10/2020 4656 Gross per 24 hour  Intake -  Output 800 ml  Net -800 ml    LBM: Last BM Date: 07/06/20 Baseline Weight: Weight: 108.9 kg Most recent weight: Weight: 108.9 kg     Palliative Assessment/Data: PPS 20%   Flowsheet Rows   Flowsheet Row Most Recent Value  Intake Tab   Referral Department Hospitalist  Unit at Time of Referral Cardiac/Telemetry Unit  Palliative Care Primary Diagnosis Pulmonary  Palliative Care Type New Palliative care  Reason for referral Clarify Goals of Care  Date first seen by Palliative Care 07/10/20  Clinical Assessment   Palliative Performance Scale Score 20%  Psychosocial & Spiritual Assessment   Palliative Care Outcomes   Patient/Family meeting held? Yes  Who was at the meeting? wife  Palliative Care Outcomes Clarified goals of care, Counseled regarding hospice, Provided end of life care assistance, Improved pain interventions, Improved non-pain symptom therapy, Provided psychosocial or spiritual support, Changed to focus on comfort, ACP counseling assistance      Time Total: 82 Greater than 50%  of this time was spent counseling and coordinating care related to the above assessment and plan.  Signed by:  Ihor Dow, DNP, FNP-C Palliative Medicine Team  Phone: [phone] Fax: [phone]   Please contact Palliative Medicine Team phone at [phone] for questions and concerns.  For individual provider: See Shea Evans

## 2020-07-11 DIAGNOSIS — Z515 Encounter for palliative care: Secondary | ICD-10-CM | POA: Diagnosis not present

## 2020-07-11 DIAGNOSIS — U071 COVID-19: Secondary | ICD-10-CM | POA: Diagnosis not present

## 2020-07-11 DIAGNOSIS — J9601 Acute respiratory failure with hypoxia: Secondary | ICD-10-CM | POA: Diagnosis not present

## 2020-07-11 DIAGNOSIS — R0600 Dyspnea, unspecified: Secondary | ICD-10-CM | POA: Diagnosis not present

## 2020-07-11 DIAGNOSIS — R0603 Acute respiratory distress: Secondary | ICD-10-CM | POA: Diagnosis not present

## 2020-07-11 NOTE — Progress Notes (Signed)
Patient continues on Morphine drip, sating at 92% hiflo [place]. PRN Morphine bolus administered x1 this shift.

## 2020-07-11 NOTE — Progress Notes (Signed)
   07/06/20 1750  Assess: MEWS Score  Temp 97.9 F (36.6 C)  BP 112/61  Pulse Rate (!) 120  Resp (!) 36  Level of Consciousness Alert  SpO2 91 %  O2 Device HFNC  O2 Flow Rate (L/min) 30 L/min  FiO2 (%) 100 %  Assess: MEWS Score  MEWS Temp 0  MEWS Systolic 0  MEWS Pulse 2  MEWS RR 3  MEWS LOC 0  MEWS Score 5  MEWS Score Color Red  Assess: if the MEWS score is Yellow or Red  Were vital signs taken at a resting state? Yes  Focused Assessment Change from prior assessment (see assessment flowsheet)  Early Detection of Sepsis Score *See Row Information* Low  MEWS guidelines implemented *See Row Information* No, other (Comment) (MD made prior to red)  Take Vital Signs  Increase Vital Sign Frequency  Red: Q 1hr X 4 then Q 4hr X 4, if remains red, continue Q 4hrs

## 2020-07-11 NOTE — Progress Notes (Signed)
Daily Progress Note   Patient Name: Bradley French       Date: 07/11/2020 DOB: 1941/02/21  Age: 80 y.o. MRN#: [id number] Attending Physician: Kerney Elbe, DO Primary Care Physician: [facility] Admit Date: 06/17/2020  Reason for Consultation/Follow-up: Terminal Care  Subjective: Medical records reviewed. Received report from RN/Michaella and assessed patient. No family at the bedside. Patient is on HFNC 90% FiO2. He denies pain, discomfort, or worrying. He states that his family has been visiting with him. His affect is flat.  Created space and opportunity to explore thoughts and feelings regarding his poor prognosis. He shakes his head no when asked if he has had this conversation with a provider. 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 Expressed concern that Bradley French will not make it out of the hospital. He is stoic and denies additional questions or concerns. Provided reassurance that his family will return to visit later today.  Length of Stay: 27  Current Medications: Scheduled Meds:  . diclofenac Sodium  2 g Topical Q2000  . nicotine  21 mg Transdermal Daily    Continuous Infusions: . morphine 1 mg/hr (07/10/20 1341)    PRN Meds: acetaminophen, acetaminophen, albuterol, bisacodyl, glycopyrrolate, guaiFENesin-dextromethorphan, LORazepam, morphine injection, morphine, ondansetron **OR** ondansetron (ZOFRAN) IV  Physical Exam Vitals and nursing note reviewed.  Constitutional:      Appearance: He is ill-appearing.  Cardiovascular:     Rate and Rhythm: Normal rate.  Pulmonary:     Effort: No tachypnea, accessory  muscle usage or respiratory distress.     Breath sounds: Decreased breath sounds present.     Comments: HFNC 90% FIO2, 40L. Comfortable on morphine infusion. Neurological:     Mental Status: He is easily aroused.     Comments: Awake, alert, does not engage in discussion  Psychiatric:        Mood and Affect: Affect is flat.            Vital Signs: BP 119/70 (BP Location: Right Arm)   Pulse 93   Temp 97.7 F (36.5 C) (Oral)   Resp (!) 25   Ht 6\' 4"  (1.93 m)   Wt 108.9 kg   SpO2 91%   BMI 29.21  kg/m  SpO2: SpO2: 91 % O2 Device: O2 Device: High Flow Nasal Cannula O2 Flow Rate: O2 Flow Rate (L/min): 12 L/min  Intake/output summary:   Intake/Output Summary (Last 24 hours) at 07/11/2020 1258 Last data filed at 07/11/2020 0600 Gross per 24 hour  Intake 33.47 ml  Output 900 ml  Net -866.53 ml   LBM: Last BM Date: 07/06/20 Baseline Weight: Weight: 108.9 kg Most recent weight: Weight: 108.9 kg       Palliative Assessment/Data: 20%    Flowsheet Rows   Flowsheet Row Most Recent Value  Intake Tab   Referral Department Hospitalist  Unit at Time of Referral Cardiac/Telemetry Unit  Palliative Care Primary Diagnosis Pulmonary  Palliative Care Type New Palliative care  Reason for referral Clarify Goals of Care  Date first seen by Palliative Care 07/10/20  Clinical Assessment   Palliative Performance Scale Score 20%  Psychosocial & Spiritual Assessment   Palliative Care Outcomes   Patient/Family meeting held? Yes  Who was at the meeting? wife  Palliative Care Outcomes Clarified goals of care, Counseled regarding hospice, Provided end of life care assistance, Improved pain interventions, Improved non-pain symptom therapy, Provided psychosocial or spiritual support, Changed to focus on comfort, ACP counseling assistance      Patient Active Problem List   Diagnosis Date Noted  . Palliative care by specialist 07/10/2020  . Dyspnea   . Acute respiratory distress   . Hypoxia    . Acute respiratory failure with hypoxia ([place])   . COVID-19 virus infection 06/01/2020  . COVID-19 06/30/2020  . Traumatic arthritis of knee 11/05/2015  . Leg weakness 10/03/2013  . Lower extremity weakness 10/03/2013  . Abnormal MRI, spinal cord 10/03/2013  . OSA on CPAP   . Lower GI bleed 06/20/2012  . DYSLIPIDEMIA 08/06/2007  . TOBACCO ABUSE 08/06/2007  . CORONARY ARTERY DISEASE 08/06/2007  . COPD 08/06/2007  . DIVERTICULOSIS, COLON 08/06/2007  . GASTRIC ULCER, HX OF 08/06/2007  . HEMORRHOIDS, HX OF 08/06/2007  . CORONARY ARTERY BYPASS GRAFT, TWO VESSEL, HX OF 08/06/2007  . Other postprocedural status(V45.89) 08/06/2007    Palliative Care Assessment & Plan   Patient Profile: 80 y.o. male with past medical history of COPD, CAD s/p CABG 2006, HTN, DM type 2, OSA on CPAP, depression, anxiety, HLD, smoker admitted on 06/11/2020 with shortness of breath and hypoxia from PCP. COVID positive. CXR reveals bilateral multifocal infiltrates. Patient has completed baricitinib, remdesivir, steroids. Unfortunately, respiratory status continues to worsen despite maximum therapy. 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 After conversations with wife, decision made for DNR/DNI code status. Palliative medicine consultation for terminal care.   Assessment: Comfort measures only  Recommendations/Plan: Continue morphine infusion 1mg /hr Wean HFNC as tolerated for comfort due to complaints of strong flow and removal of [place] RN may bolus via infusion 2-4mg  IV q27min prn pain/dyspnea/air hunger/tachypnea Ativan 1mg  IV q4h prn anxiety Robinul 0.2mg  IV q4h prn secretions Dulcolax PR prn constipation Tylenol PR prn fever >101 Unrestricted visitor access Comfort feeds per patient/family request.  Goals of Care and Additional Recommendations: Limitations on Scope of Treatment: Full Comfort Care  Code Status: DNR/DNI   Code Status Orders  (From admission, onward)         Start     Ordered   07/07/20  1214  Do not attempt resuscitation (DNR)  Continuous       Question Answer Comment  In the event of cardiac or respiratory ARREST Do not call a "code blue"   In the event  of cardiac or respiratory ARREST Do not perform Intubation, CPR, defibrillation or ACLS   In the event of cardiac or respiratory ARREST Use medication by any route, position, wound care, and other measures to relive pain and suffering. May use oxygen, suction and manual treatment of airway obstruction as needed for comfort.      07/07/20 1213        Code Status History    Date Active Date Inactive Code Status Order ID Comments User Context   06/29/2020 1925 07/07/2020 1213 Full Code [id number]  Lequita Halt, MD ED   10/03/2013 2330 10/07/2013 1519 Full Code [id number]  Rise Patience, MD Inpatient   06/20/2012 1423 06/22/2012 1343 Full Code [id number]  Marzetta Board, MD ED   Advance Care Planning Activity      Prognosis:  Hours - Days  Discharge Planning: Anticipated Hospital Death  Care plan was discussed with RN/Michaella and Ihor Dow DNP FNP-C, Dr. Alfredia Ferguson  Thank you for allowing the Palliative Medicine Team to assist in the care of this patient.   Time In: 09:20 Time Out: 09:35 Total Time 15 Prolonged Time Billed  no     Greater than 50% of this time was spent counseling and coordinating care related to the above assessment and plan.  Antwyne Pingree Johnnette Litter, PA-C   Ihor Dow, DNP, FNP-C Palliative Medicine Team  Phone: [phone] Fax: [phone]  Please contact Palliative Medicine Team phone at [phone] for questions and concerns.

## 2020-07-11 NOTE — Progress Notes (Signed)
PROGRESS NOTE    Bradley French  WFU:[id number] DOB: 12-Dec-1940 DOA: 06/08/2020 PCP: [facility]  Brief Narrative:  Patient is a 80 year old chronically ill-appearing African-American male with past medical history significant for barometer to CAD status post CABG 2006, hypertension, diabetes mellitus type 2, OSA on CPAP, COPD as well as other comorbidities presents with cough and increasing shortness of breath.  He started to have symptoms of a dry cough and shortness with 6 days ago on high possible cold exposure 5 days prior.  He also had subjective fevers and chills and loss of appetite but no abdominal pain or diarrhea.  He has been vaccinated for Covid x2.  He went to see his PCP was checked his pulse oximetry and was found to be low and told to go to the ED.  In the ED is Covid positive chest x-ray findings showing bilateral multifocal infiltrates.  He was aggressively treated and is status post baricitinib, remdesivir of steroids but continued to persist to have high oxygen requirements. Patient temporarily improved but over the last week or so has been slowly getting worse suspecting a fibrotic stage of ARDS.  He continued to have significant amount of oxygen on heated high flow nasal cannula and after continued goals of care discussion he was changed to DNR.  Patient continued to worsen and has now been transitioned to comfort measures and remains on a Morphine gtt. Now appears more comfortable on Morphine gtt  Assessment & Plan:   Active Problems:   COVID-19 virus infection   COVID-19   Acute respiratory failure with hypoxia (HCC)   Hypoxia   Acute respiratory distress   Palliative care by specialist   Dyspnea  Acute hypoxic respiratory failure due to COVID-19 pneumonia, pneumomediastinum, concern for HCAP -Has been treated with baricitinib, Remdesivir, steroids.  Despite aggressive treatment his respiratory status is not improving more than 3 weeks out from his  admission, suggesting a very poor prognosis.  -Over the last couple of days he has gotten worse and now he is on heated high flow. He was placed Back on a steroid trial 2/5-2/7, no improvement -No results for input(s): DDIMER, FERRITIN, LDH, CRP in the last 72 hours. Last CRP was checked on 07/06/20 and was 27.4 and had previously resolved   Lab Results  Component Value Date   [place] NEGATIVE 04/12/2020   -SpO2: 91 % O2 Flow Rate (L/min): 6 L/min FiO2 (%): 60 % -CT angiogram with findings of small to moderate pneumomediastinum, chest x-ray 2/5 sooner with prior -Completed Vanco cefepime for 7 days, last dose of cefepime 2/6 -Patient continues to be get worse and was uncomfortable despite being on HFNC, likely will pass in the hospital. Palliative care to assist during this difficult times and transitioning to the dying process and likely will have an in hospital death given poor prognosis  -ALL medications not In the patient's comfort will be discontinued. He will remain on Acetaminophen for Mild Pain of Fever, Albuterol nebs, Bisacodyl Suppository, Glycopyrrolate, Lorazepam, Morphine gtt with IV Breakthrough 2-4 mg, Nicotine Patch, Ondansetron -Anticipate imminent Hospital Death given poor Prognosis and High Risk for Further Decompensation given his poor Lung Reserve   End of Life/Terminal Care -See Above  Type 2 Diabetes Mellitus -Continued Lantus but increase the Dose from 10 units sq qHS to 12 units, Increase Novolog from 5 units TIDwm to 7 unuts and C/w Moderate NovoLog SSI AC/HS. -Continue to Monitor CBG's Carefully and adjust Insulin as Necessary  -CBGs ranging from  140-302 on the checks but will not check any more -Diabetes Education Coordinator Following and appreciate Reccomendations -Will not Check any more CBG's as he is being transitioned to Comfort Measures  Thrombocytopenia  -Platelet Count has dropped and gone from 163 -> 123 -> 137 yesterday  -Continue to Monitor For  S/Sx of bleeding  -Will not repeat CBC as patient will be transitioning to [place], Ambulatory Dysfunction, POA -PT recommended SNF but he is getting worse and will not purse as Anticipated Hospital Death is expected.  Essential Hypertension -Was continuing home regimen but will be transitioned to Comfort measures and BP medications will stop, blood pressure is stable  Goals of care -Discussing with the wife daily.  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  Wife relates that he has been a long term smoker, has known lung disease and emphysematous changes and he has continued to smoke up until he got sick.  She also mentioned that he has been drinking heavily until last month when he was hospitalized.  Continue DNR/DNI, oxygen, supportive care short of moving to the ICU/intubation/code. Since he has made no improvement and continues to worsen despite adequate treatment a decision was made to transition the patient to comfort Measures after discussion with Palliative   -Now remains on a Morphine gtt and appears comfortable   DVT prophylaxis: Enoxaparin 40 mg sq q24h Code Status: DO NOT RESUSCITATE  Family Communication: No family present at bedside  Disposition Plan: Anticipated In-Hospital Death as he is on Morphine gtt and Comfort Measures Only   Status is: Inpatient  Remains inpatient appropriate because:Unsafe d/c plan, IV treatments appropriate due to intensity of illness or inability to take PO and Inpatient level of care appropriate due to severity of illness   Dispo: The patient is from: Home              Anticipated d/c is to: In Hospital Death              Anticipated d/c date is:1-2 days              Patient currently is not medically stable to d/c.   Difficult to place patient  No   Consultants:   Palliative Care   Pulmonary Critical Care  Procedures: None  Antimicrobials:  Anti-infectives (From admission, onward)   Start     Dose/Rate Route Frequency Ordered Stop   07/03/20 0615  vancomycin (VANCOREADY) IVPB 1500 mg/300 mL  Status:  Discontinued        1,500 mg 150 mL/hr over 120 Minutes Intravenous Every 12 hours 07/02/20 1743 07/04/20 1406   07/02/20 1815  vancomycin (VANCOCIN) 2,250 mg in sodium chloride 0.9 % 500 mL IVPB        2,250 mg 250 mL/hr over 120 Minutes Intravenous  Once 07/02/20 1743 07/02/20 2055   07/02/20 1800  ceFEPIme (MAXIPIME) 2 g in sodium chloride 0.9 % 100 mL IVPB        2 g 200 mL/hr over 30 Minutes Intravenous Every 8 hours 07/02/20 1743 07/07/20 1207   06/19/20 1100  remdesivir 100 mg in sodium chloride 0.9 % 100 mL IVPB        100 mg 200 mL/hr over 30 Minutes Intravenous  Once 06/19/20 1003 06/20/20 0859   06/15/20 1000  remdesivir 100 mg in sodium chloride  0.9 % 100 mL IVPB       "Followed by" Linked Group Details   100 mg 200 mL/hr over 30 Minutes Intravenous Daily 06/27/2020 1912 06/19/20 0959   06/01/2020 2100  remdesivir 200 mg in sodium chloride 0.9% 250 mL IVPB       "Followed by" Linked Group Details   200 mg 580 mL/hr over 30 Minutes Intravenous Once 06/12/2020 1912 06/17/2020 2242        Subjective: The patient was seen and examined at bedside and he was more comfortable today than yesterday. No CP and Dyspnea is improved. Feels ok but withdrawn. No other concerns or complaints at this time.   Objective: Vitals:   07/10/20 1218 07/10/20 1520 07/10/20 1930 07/11/20 0821  BP:      Pulse:  (!) 101 (!) 102 93  Resp:  (!) 28 (!) 26 (!) 25  Temp:      TempSrc:      SpO2: (!) 87% (!) 89% 94% 91%  Weight:      Height:        Intake/Output Summary (Last 24 hours) at 07/11/2020 1413 Last data filed at 07/11/2020 0600 Gross per 24 hour  Intake 33.47 ml  Output 900 ml  Net -866.53 ml   Filed Weights   06/11/2020  1039  Weight: 108.9 kg   Examination: Physical Exam:  Constitutional: WN/WD elderly chronically ill-appearing African-American male and not as much respiratory distress as he was yesterday.  He appears a bit calmer today and more comfortable. Eyes: Lids and conjunctivae normal, sclerae anicteric  ENMT: External Ears, Nose appear normal. Grossly normal hearing. Neck: Appears normal, supple, no cervical masses, normal ROM, no appreciable thyromegaly; no JVD Respiratory: Diminished to auscultation bilaterally with coarse breath sounds, no wheezing, rales, rhonchi or crackles. Normal respiratory effort and patient is not tachypenic. No accessory muscle use.  Wearing a very high amount of oxygen but has unlabored breathing and not as dyspneic Cardiovascular: RRR, no murmurs / rubs / gallops. S1 and S2 auscultated.  Minimal extremity edema Abdomen: Soft, non-tender, non-distended.  Bowel sounds positive.  GU: Deferred. Musculoskeletal: No clubbing / cyanosis of digits/nails. No joint deformity upper and lower extremities.  Skin: No rashes, lesions, ulcers on limited skin evaluation. No induration; Warm and dry.  Neurologic: CN 2-12 grossly intact with no focal deficits. Romberg sign and cerebellar reflexes not assessed.  Psychiatric: Normal judgment and insight. Alert and oriented x 3.  Calmer mood and appropriate affect.   Data Reviewed: I have personally reviewed following labs and imaging studies  CBC: Recent Labs  Lab 07/06/20 0147 07/07/20 0137 07/08/20 0207 07/10/20 0243  WBC 10.8* 9.4 10.4 8.0  HGB 13.9 14.4 14.1 14.6  HCT 41.1 41.0 40.1 42.4  MCV 88.4 87.2 88.1 88.9  PLT PLATELET CLUMPS NOTED ON SMEAR, UNABLE TO ESTIMATE PLATELET CLUMPS NOTED ON SMEAR, UNABLE TO ESTIMATE 123* 654*   Basic Metabolic Panel: Recent Labs  Lab 07/06/20 0147 07/07/20 0137 07/08/20 0207 07/10/20 0243  NA 135 136 138 139  K 4.0 4.2 5.0 4.8  CL 105 102 105 105  CO2 22 23 24 25   GLUCOSE 226* 200*  283* 136*  BUN 13 12 20 18   CREATININE 0.67 0.67 0.85 0.55*  CALCIUM 8.5* 8.6* 8.5* 8.8*   GFR: Estimated Creatinine Clearance: 102.9 mL/min (A) (by C-G formula based on SCr of 0.55 mg/dL (L)). Liver Function Tests: Recent Labs  Lab 07/06/20 0147 07/07/20 0137  AST 13* 15  ALT 41  43  ALKPHOS 64 69  BILITOT 1.2 1.5*  PROT 4.9* 5.9*  ALBUMIN 2.0* 2.1*   No results for input(s): LIPASE, AMYLASE in the last 168 hours. No results for input(s): AMMONIA in the last 168 hours. Coagulation Profile: No results for input(s): INR, PROTIME in the last 168 hours. Cardiac Enzymes: No results for input(s): CKTOTAL, CKMB, CKMBINDEX, TROPONINI in the last 168 hours. BNP (last 3 results) No results for input(s): PROBNP in the last 8760 hours. HbA1C: No results for input(s): HGBA1C in the last 72 hours. CBG: Recent Labs  Lab 07/09/20 1202 07/09/20 1647 07/09/20 2208 07/10/20 0810 07/10/20 1205  GLUCAP 266* 140* 240* 202* 207*   Lipid Profile: No results for input(s): CHOL, HDL, LDLCALC, TRIG, CHOLHDL, LDLDIRECT in the last 72 hours. Thyroid Function Tests: No results for input(s): TSH, T4TOTAL, FREET4, T3FREE, THYROIDAB in the last 72 hours. Anemia Panel: No results for input(s): VITAMINB12, FOLATE, FERRITIN, TIBC, IRON, RETICCTPCT in the last 72 hours. Sepsis Labs: No results for input(s): PROCALCITON, LATICACIDVEN in the last 168 hours.  No results found for this or any previous visit (from the past 240 hour(s)).   RN Pressure Injury Documentation:     Estimated body mass index is 29.21 kg/m as calculated from the following:   Height as of this encounter: 6\' 4"  (1.93 m).   Weight as of this encounter: 108.9 kg.  Malnutrition Type:  Nutrition Problem: Increased nutrient needs Etiology: acute illness (COVID PNA)  Malnutrition Characteristics:  Signs/Symptoms: estimated needs  Nutrition Interventions:  Interventions: Ensure Enlive (each supplement provides 350kcal and  20 grams of protein)   Radiology Studies: No results found.   Scheduled Meds: . diclofenac Sodium  2 g Topical Q2000  . nicotine  21 mg Transdermal Daily   Continuous Infusions: . morphine 1 mg/hr (07/10/20 1341)    LOS: 27 days   Kerney Elbe, DO [facility] PAGER is on [place]  If 7PM-7AM, please contact night-coverage [email or web address]

## 2020-07-12 DIAGNOSIS — Z515 Encounter for palliative care: Secondary | ICD-10-CM | POA: Diagnosis not present

## 2020-07-12 DIAGNOSIS — J9601 Acute respiratory failure with hypoxia: Secondary | ICD-10-CM | POA: Diagnosis not present

## 2020-07-12 DIAGNOSIS — R0600 Dyspnea, unspecified: Secondary | ICD-10-CM | POA: Diagnosis not present

## 2020-07-12 DIAGNOSIS — U071 COVID-19: Secondary | ICD-10-CM | POA: Diagnosis not present

## 2020-07-30 NOTE — Plan of Care (Signed)
  Problem: Education: Goal: Knowledge of risk factors and measures for prevention of condition will improve Outcome: Not Applicable   Problem: Coping: Goal: Psychosocial and spiritual needs will be supported Outcome: Not Applicable   Problem: Respiratory: Goal: Will maintain a patent airway Outcome: Not Applicable Goal: Complications related to the disease process, condition or treatment will be avoided or minimized Outcome: Not Applicable   Problem: Education: Goal: Knowledge of General Education information will improve Description: Including pain rating scale, medication(s)/side effects and non-pharmacologic comfort measures Outcome: Not Applicable   Problem: Health Behavior/Discharge Planning: Goal: Ability to manage health-related needs will improve Outcome: Not Applicable   Problem: Clinical Measurements: Goal: Ability to maintain clinical measurements within normal limits will improve Outcome: Not Applicable Goal: Will remain free from infection Outcome: Not Applicable Goal: Diagnostic test results will improve Outcome: Not Applicable Goal: Respiratory complications will improve Outcome: Not Applicable Goal: Cardiovascular complication will be avoided Outcome: Not Applicable   Problem: Activity: Goal: Risk for activity intolerance will decrease Outcome: Not Applicable   Problem: Nutrition: Goal: Adequate nutrition will be maintained Outcome: Not Applicable   Problem: Coping: Goal: Level of anxiety will decrease Outcome: Not Applicable   Problem: Elimination: Goal: Will not experience complications related to bowel motility Outcome: Not Applicable Goal: Will not experience complications related to urinary retention Outcome: Not Applicable   Problem: Pain Managment: Goal: General experience of comfort will improve Outcome: Not Applicable   Problem: Safety: Goal: Ability to remain free from injury will improve Outcome: Not Applicable   Problem: Skin  Integrity: Goal: Risk for impaired skin integrity will decrease Outcome: Not Applicable

## 2020-07-30 NOTE — Progress Notes (Signed)
Daily Progress Note   Patient Name: Bradley French       Date: 07-14-2020 DOB: 02/27/1941  Age: 80 y.o. MRN#: [id number] Attending Physician: Kerney Elbe, DO Primary Care Physician: [facility] Admit Date: 06/17/2020  Reason for Consultation/Follow-up: Terminal Care  Subjective: Patient actively dying but appears comfortable on morphine infusion. Lethargic. Shallow, irregular respirations with periods of apnea. Seems to be nearing EOL.   Family at bedside including wife and daughter. Educated on comfort focused pathway, symptom management medications, and poor prognosis. Discussed EOL expectations. Answered questions and concerns regarding plan. Support provided. PMT contact information given.   Length of Stay: 28  Current Medications: Scheduled Meds:  . diclofenac Sodium  2 g Topical Q2000  . nicotine  21 mg Transdermal Daily    Continuous Infusions: . morphine 1.5 mg/hr (2020-07-14 0512)    PRN Meds: acetaminophen, acetaminophen, albuterol, bisacodyl, glycopyrrolate, guaiFENesin-dextromethorphan, LORazepam, morphine injection, morphine, ondansetron **OR** ondansetron (ZOFRAN) IV  Physical Exam Vitals and nursing note reviewed.  Constitutional:      Appearance: He is ill-appearing.  Cardiovascular:     Rate and Rhythm: Normal rate.  Pulmonary:     Effort: No tachypnea, accessory muscle usage or respiratory distress.     Breath sounds: Decreased breath sounds present.     Comments: 6L HFNC. Comfortable on morphine infusion. Shallow, irregular respirations with periods of apnea. Neurological:     Mental Status: He is easily aroused. He is lethargic.  Psychiatric:        Mood and Affect: Affect is flat.            Vital Signs: BP 122/66 (BP Location: Left  Arm)   Pulse 93   Temp 100.2 F (37.9 C)   Resp (!) 25   Ht 6\' 4"  (1.93 m)   Wt 108.9 kg   SpO2 91%   BMI 29.21 kg/m  SpO2: SpO2: 91 % O2 Device: O2 Device: High Flow Nasal Cannula O2 Flow Rate: O2 Flow Rate (L/min): 6 L/min  Intake/output summary:   Intake/Output Summary (Last 24 hours) at 2020-07-14 1120 Last data filed at 07/11/2020 2203 Gross per 24 hour  Intake 20.87 ml  Output 450 ml  Net -429.13 ml   LBM: Last BM Date: 07/06/20 Baseline Weight: Weight: 108.9 kg Most recent weight: Weight: 108.9 kg  Palliative Assessment/Data: 20%    Flowsheet Rows   Flowsheet Row Most Recent Value  Intake Tab   Referral Department Hospitalist  Unit at Time of Referral Cardiac/Telemetry Unit  Palliative Care Primary Diagnosis Pulmonary  Palliative Care Type New Palliative care  Reason for referral Clarify Goals of Care  Date first seen by Palliative Care 07/10/20  Clinical Assessment   Palliative Performance Scale Score 20%  Psychosocial & Spiritual Assessment   Palliative Care Outcomes   Patient/Family meeting held? Yes  Who was at the meeting? wife  Palliative Care Outcomes Clarified goals of care, Counseled regarding hospice, Provided end of life care assistance, Improved pain interventions, Improved non-pain symptom therapy, Provided psychosocial or spiritual support, Changed to focus on comfort, ACP counseling assistance      Patient Active Problem List   Diagnosis Date Noted  . Terminal care 07/10/2020  . Dyspnea   . Acute respiratory distress   . Hypoxia   . Acute respiratory failure with hypoxia ([place])   . COVID-19 virus infection 06/27/2020  . COVID-19 06/11/2020  . Traumatic arthritis of knee 11/05/2015  . Leg weakness 10/03/2013  . Lower extremity weakness 10/03/2013  . Abnormal MRI, spinal cord 10/03/2013  . OSA on CPAP   . Lower GI bleed 06/20/2012  . DYSLIPIDEMIA 08/06/2007  . TOBACCO ABUSE 08/06/2007  . CORONARY ARTERY DISEASE 08/06/2007  .  COPD 08/06/2007  . DIVERTICULOSIS, COLON 08/06/2007  . GASTRIC ULCER, HX OF 08/06/2007  . HEMORRHOIDS, HX OF 08/06/2007  . CORONARY ARTERY BYPASS GRAFT, TWO VESSEL, HX OF 08/06/2007  . Other postprocedural status(V45.89) 08/06/2007    Palliative Care Assessment & Plan   Patient Profile: 80 y.o. male with past medical history of COPD, CAD s/p CABG 2006, HTN, DM type 2, OSA on CPAP, depression, anxiety, HLD, smoker admitted on 06/15/2020 with shortness of breath and hypoxia from PCP. COVID positive. CXR reveals bilateral multifocal infiltrates. Patient has completed baricitinib, remdesivir, steroids. Unfortunately, respiratory status continues to worsen despite maximum therapy. 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 After conversations with wife, decision made for DNR/DNI code status. Palliative medicine consultation for terminal care.   Assessment: Comfort measures only  Recommendations/Plan: Continue morphine infusion 1mg /hr Wean HFNC as tolerated for comfort due to complaints of strong flow and removal of [place] RN may bolus via infusion 2-4mg  IV q23min prn pain/dyspnea/air hunger/tachypnea Ativan 1mg  IV q4h prn anxiety Robinul 0.2mg  IV q4h prn secretions Dulcolax PR prn constipation Tylenol PR prn fever >101 Unrestricted visitor access Comfort feeds per patient/family request.  Goals of Care and Additional Recommendations: Limitations on Scope of Treatment: Full Comfort Care  Code Status: DNR/DNI   Code Status Orders  (From admission, onward)         Start     Ordered   07/07/20 1214  Do not attempt resuscitation (DNR)  Continuous       Question Answer Comment  In the event of cardiac or respiratory ARREST Do not call a "code blue"   In the event of cardiac or respiratory ARREST Do not perform Intubation, CPR, defibrillation or ACLS   In the event of cardiac or respiratory ARREST Use medication by any route, position, wound care, and other measures to relive pain and suffering.  May use oxygen, suction and manual treatment of airway obstruction as needed for comfort.      07/07/20 1213        Code Status History    Date Active Date Inactive Code Status Order ID Comments User Context  06/28/2020 1925 07/07/2020 1213 Full Code [id number]  Lequita Halt, MD ED   10/03/2013 2330 10/07/2013 1519 Full Code [id number]  Rise Patience, MD Inpatient   06/20/2012 1423 06/22/2012 1343 Full Code [id number]  Marzetta Board, MD ED   Advance Care Planning Activity      Prognosis:  Likely hours  Discharge Planning: Anticipated Hospital Death  Care plan was discussed with RN, wife, daughter   Thank you for allowing the Palliative Medicine Team to assist in the care of this patient.   Total Time 15 Prolonged Time Billed  no     Greater than 50% of this time was spent counseling and coordinating care related to the above assessment and plan.   Ihor Dow, DNP, FNP-C Palliative Medicine Team  Phone: [phone] Fax: [phone]  Please contact Palliative Medicine Team phone at [phone] for questions and concerns.

## 2020-07-30 NOTE — Death Summary Note (Signed)
DEATH SUMMARY   Patient Details  Name: Bradley Bradley French MRN: [id number] DOB: Dec 21, 1940  Admission/Discharge Information   Admit Date:  Jun 28, 2020  Date of Death: Date of Death: 07-26-2020  Time of Death: Time of Death: 44  Length of Stay: 09/10/22  Referring Physician: [facility]   Reason(s) for Hospitalization  Shortness of Breath   Diagnoses  Preliminary cause of death:    Acute Cardiopulmonary Arrest in the setting of Respiratory Failure 2/2 to COVID Pneumonia complicated with ARDS, Pneumomediastinum, anm concern for HCAP  Secondary Diagnoses (including complications and co-morbidities):  Active Problems:   COVID-19 virus infection   COVID-19   Acute respiratory failure with hypoxia (HCC)   Hypoxia   Acute respiratory distress   Terminal care   Dyspnea  Acute hypoxic respiratory failure due to COVID-19 pneumonia, pneumomediastinum, concern for HCAP -Has been treated with baricitinib, Remdesivir, steroids. Despite aggressive treatment his respiratory status is not improving more than 3 weeks out from his admission, suggesting a very poor prognosis. -Over the last couple of days he has gotten worse and now he ended up on on heated high flow. He was placed Back on a steroid trial 2/5-2/7, Bradley French improvement -Pulmonary was consulted on 07/05/20 given his increased O2 requirements and because he was unable to wean from [place] HFNC Recent Labs (last 2 labs)   Bradley French results for input(s): DDIMER, FERRITIN, LDH, CRP in the last 72 hours.   Last CRP was checked on 07/06/20 and was 27.4 and had previously resolved   Recent Labs       Lab Results  Component Value Date   [place] NEGATIVE 04/12/2020     -SpO2: 91 % O2 Flow Rate (L/min): 6 L/min FiO2 (%): 60 % -CT angiogram with findings of small to moderate pneumomediastinum, chest x-ray 2/5 sooner with prior -Completed Vanco cefepime for 7 days, last dose of cefepime 2/6 -Patient continues to be get worse and was  uncomfortable despite being on HFNC, likely will pass in the hospital.Palliative care to assist during this difficult times and transitioning to the dying process and as expected had an in hospital death given poor prognosis  -ALL medications not In the patient's comfort were discontinued. He will remain on Acetaminophen for Mild Pain of Fever, Albuterol nebs, Bisacodyl Suppository, Glycopyrrolate, Lorazepam, Morphine gtt with IV Breakthrough 2-4 mg, Nicotine Patch, Ondansetron -Anticipated imminent Hospital Death given poor Prognosis and High Risk for Further Decompensation given his poor Lung Reserve  -He passed away on 07/26/2020 at 1045 AM   End of Life/Terminal Care -See Above  Type 2 Diabetes Mellitus -Continued Lantus but increase the Dose from 10 units sq qHS to 12 units, Increase Novolog from 5 units TIDwm to 7 unuts and C/w Moderate NovoLog SSI AC/HS. -Continue to Monitor CBG's Carefully and adjust Insulin as Necessary  -CBGs ranging from 140-302 on the checks but will not check any more -Diabetes Education Coordinator Following and appreciate Reccomendations -Will not Check any more CBG's as he is being transitioned to Comfort Measures  Thrombocytopenia  -Platelet Count has dropped and gone from 163 -> 123 -> 137 yesterday  -Continue to Monitor For S/Sx of bleeding  -Will not repeat CBC as patient will be transitioning to [place], Ambulatory Dysfunction, POA -PT recommended SNF but he is getting worse and will not purse as Anticipated Hospital Death is expected.  Hypoalbuminemia -Albumin was 2.1 on last check on 2/5 -Complicated Prognosis and Care  Hyperbilirubinemia -Patient's T Bili was 1.5  on last check on 07/06/20 -Was not checked since then as he was transitioned to Comfort Measures   Essential Hypertension -Was continuing home regimen but will be transitioned to Comfort measures and BP medications will stop,blood pressure is stable  Goals of  care -We were Discussing with the wife daily. He was getting worse, despite maximal therapy continues to decline currently on heated high flow this far out from the initial infection, suggesting severe damage to his lung tissue and ARDS induced fibrosis. Wife relates that he has been a long term smoker, has known lung disease and emphysematous changes and he has continued to smoke up until he got sick. She also mentioned that he has been drinking heavily until last month when he was hospitalized. Continued DNR/DNI, oxygen, supportive care short of moving to the ICU/intubation/code. Since he has made Bradley French improvement and continued to worsen despite adequate treatment a decision was made to transition the patient to comfort Measures after discussion with Palliative on -Remained on a Morphine gtt and appeared comfortable  but was unresponsive this AM and did not respond to physical and verbal stimuli   Brief Hospital Course (including significant findings, care, treatment, and services provided and events leading to death)  Bradley Bradley French is a chronically ill-appearing African-American 80 y.o. year old male with a past medical history significant for but not limited to CAD status post CABG 2006, hypertension, diabetes mellitus type 2, OSA on CPAP, COPD as well as other comorbidities presents with cough and increasing shortness of breath 4 weeks ago.  He started to have symptoms of a dry cough and shortness of breath 6 days ago and had a COVID Exposure 5 days prior to that.  He also had subjective fevers and chills and loss of appetite but Bradley French abdominal pain or diarrhea.  He has been vaccinated for Covid x2.  He went to see his PCP was checked his pulse oximetry and was found to be low and told to go to the ED.  In the ED is Covid positive chest x-ray findings showing bilateral multifocal infiltrates.  He was aggressively treated and is status post baricitinib, Remdesivir, and completed dose of steroids but  continued to persist to have high oxygen requirements. Patient temporarily improved but over the last week or so has been slowly getting worse suspecting a fibrotic stage of ARDS.  He continued to have significant amount of oxygen on heated high flow nasal cannula. He failed to truly improve and Palliative Care Medicine was consulted and after continued goals of care discussion he was changed to DNR. Patient continued to worsen and after further GOC was transitioned to comfort measures and remains on a Morphine gtt. As expected he continued to deteriorate and became unresponsive early this AM with short shallow breathing. Subsequently he passed away on 07/26/20 at 1045 AM with family present at bedside.  Pertinent Labs and Studies  Significant Diagnostic Studies CT ANGIO CHEST PE W OR WO CONTRAST  Result Date: 07/01/2020 CLINICAL DATA:  80 year old male with concern for pulmonary embolism. EXAM: CT ANGIOGRAPHY CHEST WITH CONTRAST TECHNIQUE: Multidetector CT imaging of the chest was performed using the standard protocol during bolus administration of intravenous contrast. Multiplanar CT image reconstructions and MIPs were obtained to evaluate the vascular anatomy. CONTRAST:  67mL OMNIPAQUE IOHEXOL 350 MG/ML SOLN COMPARISON:  Chest CT dated 04/27/2020. FINDINGS: Evaluation of this exam is limited due to respiratory motion artifact. Cardiovascular: There is Bradley French cardiomegaly or pericardial effusion. There is coronary vascular calcification and  postsurgical changes of CABG. Moderate calcified and noncalcified atherosclerotic plaque of the thoracic aorta. Focal dilatation of the midthoracic descending aorta measuring up to 3.5 cm similar to prior CT. Bradley French aortic dissection. The origins of the great vessels of the aortic arch appear patent as visualized. Evaluation of the pulmonary arteries is limited due to respiratory motion artifact. Bradley French pulmonary artery embolus identified. Mediastinum/Nodes: Bradley French hilar or mediastinal  adenopathy. The esophagus and the thyroid gland are grossly unremarkable. Bradley French mediastinal fluid collection. There is small to moderate pneumomediastinum. Lungs/Pleura: Background of emphysema. Bilateral pulmonary opacities with patchy areas of ground-glass opacity involving the peripheral and subpleural lung most consistent with multifocal pneumonia and suggestive of COVID pneumonia. Clinical correlation and follow-up recommended. There is Bradley French pleural effusion pneumothorax. The central airways are patent. Upper Abdomen: Small gallstones.  Scattered colonic diverticula. Musculoskeletal: Degenerative changes of the spine. Median sternotomy wires. Bradley French acute osseous pathology. Review of the MIP images confirms the above findings. IMPRESSION: 1. Bradley French CT evidence of pulmonary embolism. 2. Small to moderate pneumomediastinum. 3. Multifocal pneumonia and suggestive of COVID pneumonia. Clinical correlation and follow-up recommended. 4. Aortic Atherosclerosis (ICD10-I70.0) and Emphysema (ICD10-J43.9). Electronically Signed   By: Anner Crete M.D.   On: 07/01/2020 18:36   DG CHEST PORT 1 VIEW  Result Date: 07/06/2020 CLINICAL DATA:  Dyspnea. EXAM: PORTABLE CHEST 1 VIEW COMPARISON:  CT of the chest July 01, 2020 FINDINGS: Postsurgical changes of CABG. Cardiomediastinal silhouette is normal. Mediastinal contours appear intact. Persistent bilateral ground-glass and nodular opacities with subpleural predominance. Mild fissural thickening. The pneumomediastinum seen on recent CT is not appreciated radiographically. Osseous structures are without acute abnormality. Soft tissues are grossly normal. IMPRESSION: 1. Persistent bilateral ground-glass and nodular opacities with subpleural predominance. 2. Mild fissural thickening. Electronically Signed   By: Fidela Salisbury M.D.   On: 07/06/2020 12:13   DG CHEST PORT 1 VIEW  Result Date: 07/01/2020 CLINICAL DATA:  Weakness.  Coronavirus infection. EXAM: PORTABLE CHEST 1 VIEW  COMPARISON:  06/24/2020 FINDINGS: Previous median sternotomy and CABG. Widespread patchy pulmonary infiltrates persist, similar to the previous study allowing for technical differences. Findings could relate to ongoing pneumonia/ARDS or could be chronic scarring. Bradley French lobar consolidation or collapse. Bradley French visible effusion. IMPRESSION: Bradley French change. Widespread patchy pulmonary infiltrates. Findings could relate to ongoing pneumonia/ARDS or could be chronic scarring. Electronically Signed   By: Nelson Chimes M.D.   On: 07/01/2020 13:21   DG CHEST PORT 1 VIEW  Result Date: 06/24/2020 CLINICAL DATA:  Weakness and shortness of breath acute COVID EXAM: PORTABLE CHEST 1 VIEW COMPARISON:  06/18/2020 FINDINGS: Low lung volumes. Left greater than right patchy opacities. Bradley French significant pleural effusion. Stable cardiomediastinal contours. IMPRESSION: Left greater than right patchy opacities likely reflecting COVID pneumonia. Aeration has worsened. Electronically Signed   By: Macy Mis M.D.   On: 06/24/2020 15:32   DG Chest Portable 1 View  Result Date: 06/26/2020 CLINICAL DATA:  Shortness of breath, COVID-19 positive. EXAM: PORTABLE CHEST 1 VIEW COMPARISON:  June 10, 2020. FINDINGS: Stable cardiomediastinal silhouette. Status post coronary bypass graft. Bradley French pneumothorax or pleural effusion is noted. Right lung is clear. Mild left basilar patchy opacity is noted concerning for atelectasis or possibly infiltrate. Bony thorax is unremarkable. IMPRESSION: Mild left basilar patchy opacity is noted concerning for atelectasis or possibly infiltrate. Electronically Signed   By: Marijo Conception M.D.   On: 06/04/2020 13:55   DG ESOPHAGUS W SINGLE CM (SOL OR THIN BA)  Result Date: 07/03/2020 CLINICAL DATA:  Chronic cough, possible aspiration. EXAM: ESOPHOGRAM / BARIUM SWALLOW / BARIUM TABLET STUDY TECHNIQUE: Combined double contrast and single contrast examination performed using effervescent crystals, thick barium liquid, and  thin barium liquid. The patient was observed with fluoroscopy swallowing a 13 mm barium sulphate tablet. FLUOROSCOPY TIME:  Fluoroscopy Time:  1 minutes 54 seconds. Radiation Exposure Index (if provided by the fluoroscopic device): Number of Acquired Spot Images: 0 COMPARISON:  CT chest 07/01/2020. FINDINGS: A limited examination was performed in the recumbent LPO position. Patient drank thin barium. Poor esophageal motility with tertiary contractions. Bradley French esophageal stricture. A 13 mm barium tablet would not pass beyond the distal esophagus, despite lack of a stricture, likely due to dysmotility. IMPRESSION: 13 mm barium tablet would not pass beyond the distal esophagus, likely due to dysmotility. Bradley French evidence of a stricture. Electronically Signed   By: Lorin Picket M.D.   On: 07/03/2020 09:52    Microbiology Bradley French results found for this or any previous visit (from the past 240 hour(s)).  Lab Basic Metabolic Panel: Recent Labs  Lab 07/06/20 0147 07/07/20 0137 07/08/20 0207 07/10/20 0243  NA 135 136 138 139  K 4.0 4.2 5.0 4.8  CL 105 102 105 105  CO2 22 23 24 25   GLUCOSE 226* 200* 283* 136*  BUN 13 12 20 18   CREATININE 0.67 0.67 0.85 0.55*  CALCIUM 8.5* 8.6* 8.5* 8.8*   Liver Function Tests: Recent Labs  Lab 07/06/20 0147 07/07/20 0137  AST 13* 15  ALT 41 43  ALKPHOS 64 69  BILITOT 1.2 1.5*  PROT 4.9* 5.9*  ALBUMIN 2.0* 2.1*   Bradley French results for input(s): LIPASE, AMYLASE in the last 168 hours. Bradley French results for input(s): AMMONIA in the last 168 hours. CBC: Recent Labs  Lab 07/06/20 0147 07/07/20 0137 07/08/20 0207 07/10/20 0243  WBC 10.8* 9.4 10.4 8.0  HGB 13.9 14.4 14.1 14.6  HCT 41.1 41.0 40.1 42.4  MCV 88.4 87.2 88.1 88.9  PLT PLATELET CLUMPS NOTED ON SMEAR, UNABLE TO ESTIMATE PLATELET CLUMPS NOTED ON SMEAR, UNABLE TO ESTIMATE 123* 137*   Cardiac Enzymes: Bradley French results for input(s): CKTOTAL, CKMB, CKMBINDEX, TROPONINI in the last 168 hours. Sepsis Labs: Recent Labs  Lab  07/06/20 0147 07/07/20 0137 07/08/20 0207 07/10/20 0243  WBC 10.8* 9.4 10.4 8.0    Procedures/Operations  None  CONSULTS Pulmonary [place], DO [facility] 07/31/2020, 11:07 AM

## 2020-07-30 NOTE — Progress Notes (Signed)
Pt remain on morphine drip IV continous for comfort measures. He's now unresponsive with cheyne stokes shallow respirations.

## 2020-07-30 DEATH — deceased

## 2022-05-10 IMAGING — DX DG CHEST 1V PORT
1 series · 1 of 1 positions shown · non-contrast
Comparison: June 10, 2020.

CLINICAL DATA: Shortness of breath, [id number] positive.

EXAM:
PORTABLE CHEST 1 VIEW

[chest]
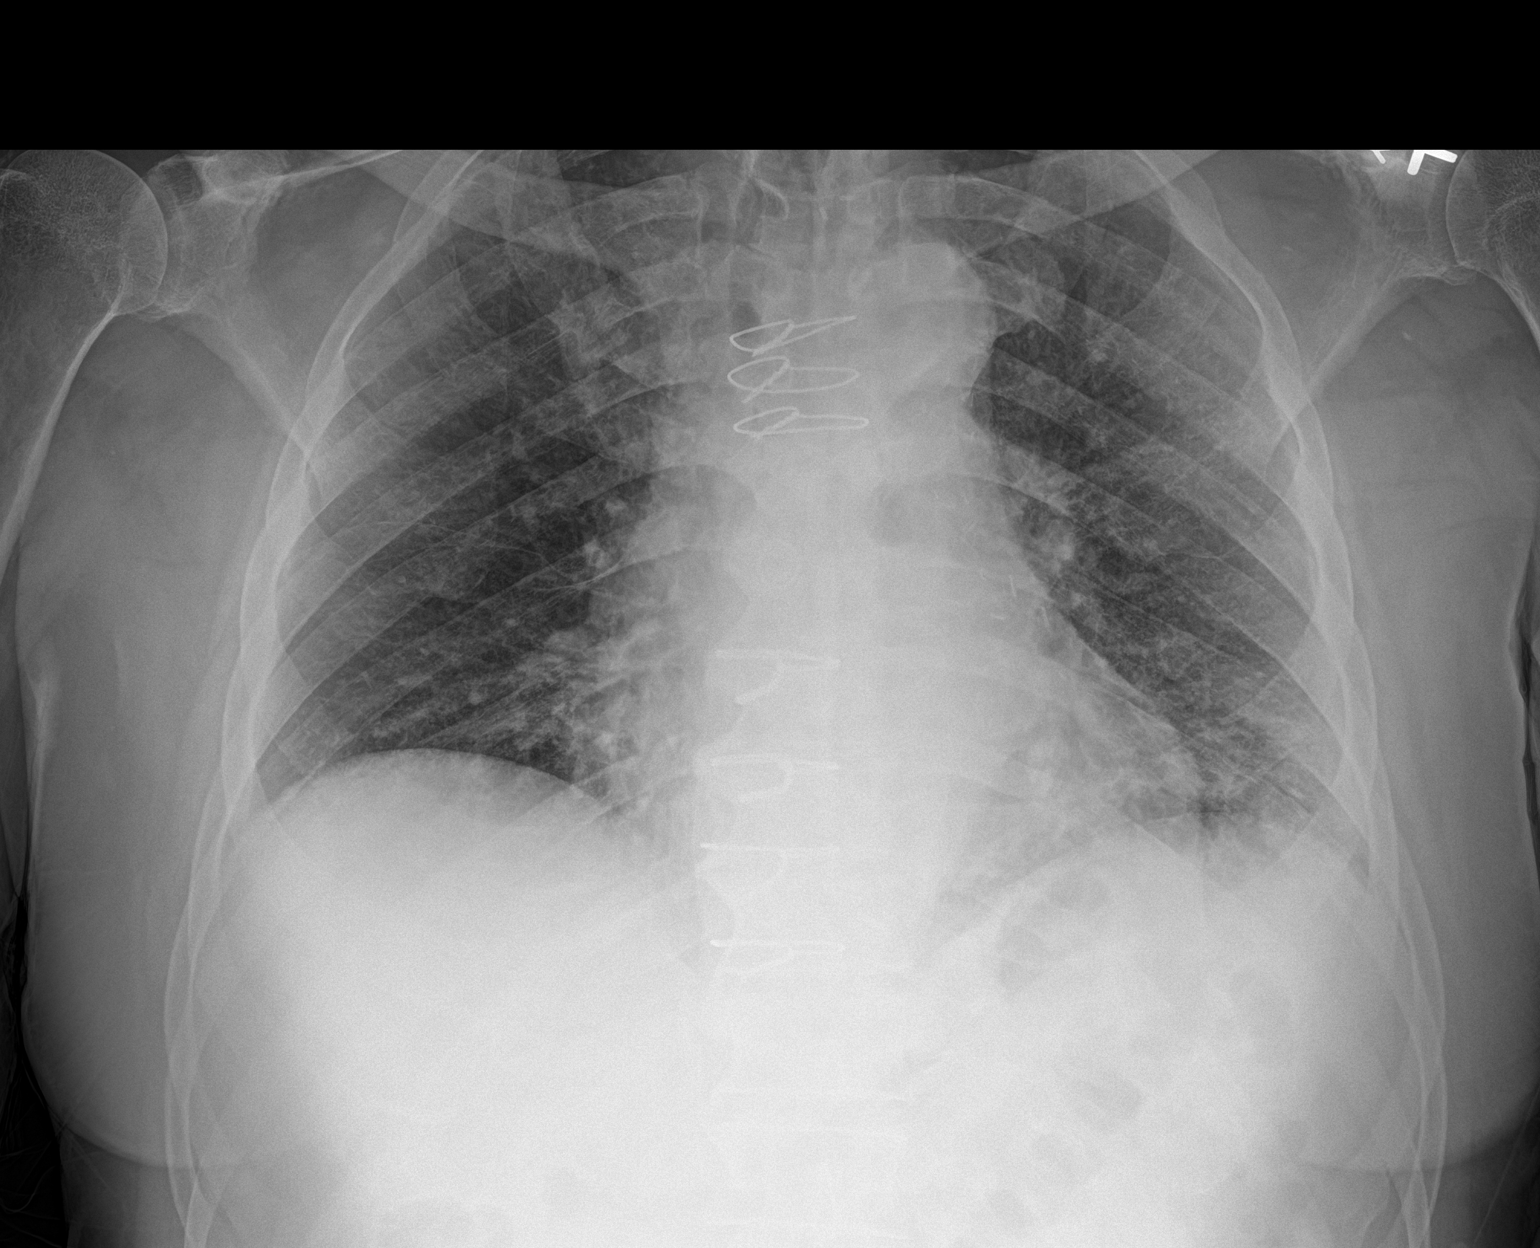

[1 of 1 positions shown; findings below may reference images not displayed]

FINDINGS: Stable cardiomediastinal silhouette. Status post coronary bypass
graft. No pneumothorax or pleural effusion is noted. Right lung is
clear. Mild left basilar patchy opacity is noted concerning for
atelectasis or possibly infiltrate. Bony thorax is unremarkable.
IMPRESSION: Mild left basilar patchy opacity is noted concerning for atelectasis
or possibly infiltrate.

## 2022-05-27 IMAGING — CT CT ANGIO CHEST
2 of 7 series · 18 of 46 positions shown · IV contrast (APPLIED)
Comparison: Chest CT dated 04/27/2020.

CLINICAL DATA: 79-year-old male with concern for pulmonary
embolism.

EXAM:
CT ANGIOGRAPHY CHEST WITH CONTRAST
TECHNIQUE: Multidetector CT imaging of the chest was performed using the
standard protocol during bolus administration of intravenous
contrast. Multiplanar CT image reconstructions and MIPs were
obtained to evaluate the vascular anatomy.
CONTRAST:  60mL OMNIPAQUE IOHEXOL 350 MG/ML SOLN

[Series 7: thins · axial · 0.79mm/px · z∈[+1231,+1544]mm · 15 of 504 slices shown]
[im 28/504  lung]
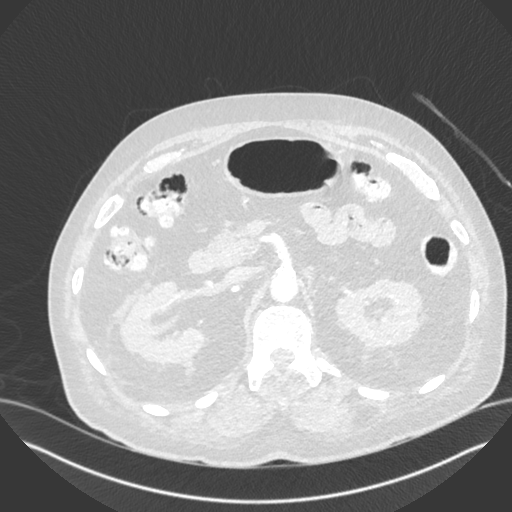
[im 56/504  soft-tissue]
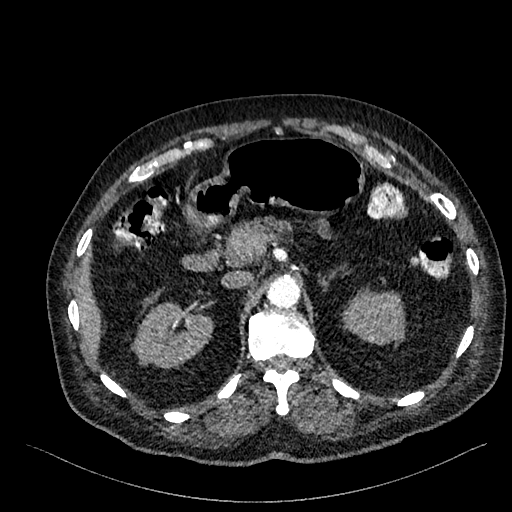
[im 84/504  lung]
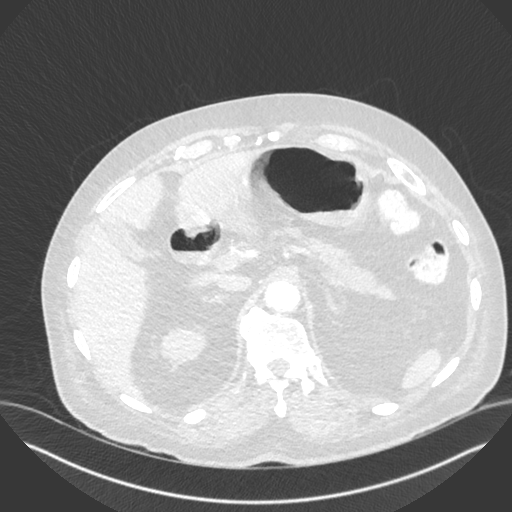
[im 112/504  soft-tissue]
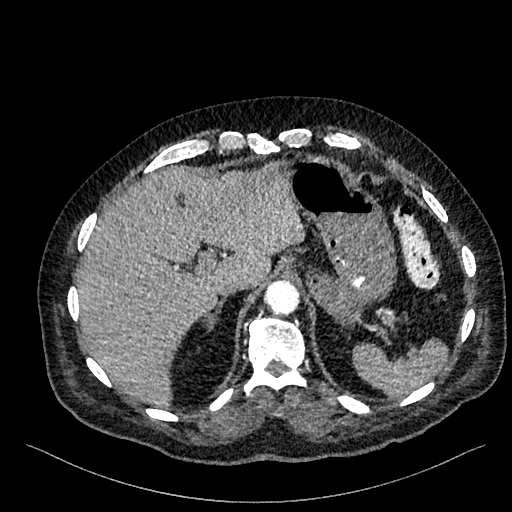
[im 168/504  lung]
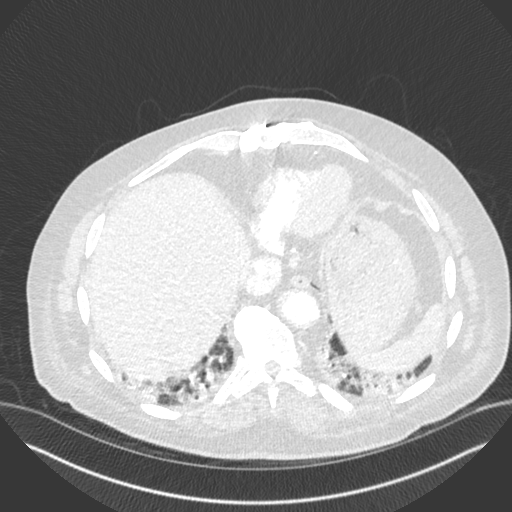
[im 196/504  soft-tissue]
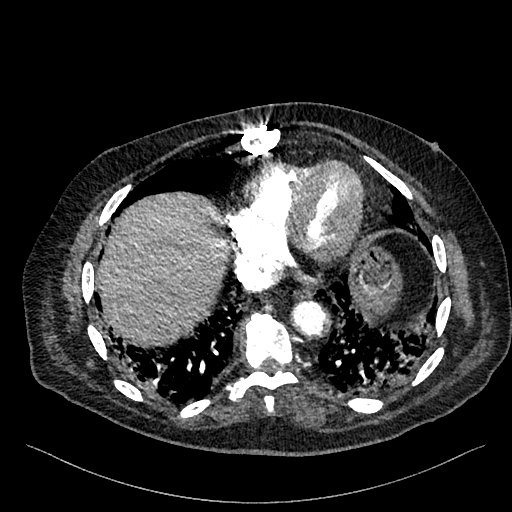
[im 224/504  lung]
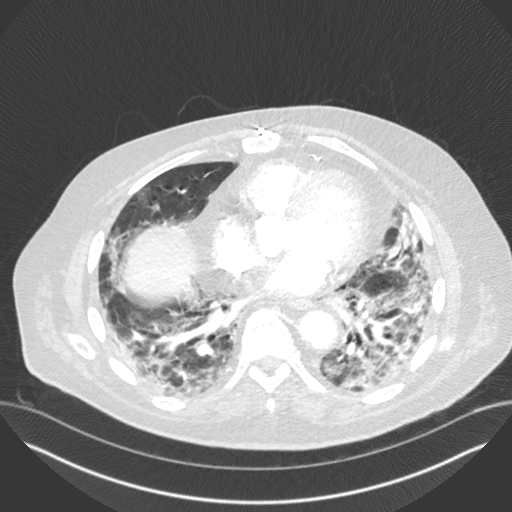
[im 252/504  soft-tissue]
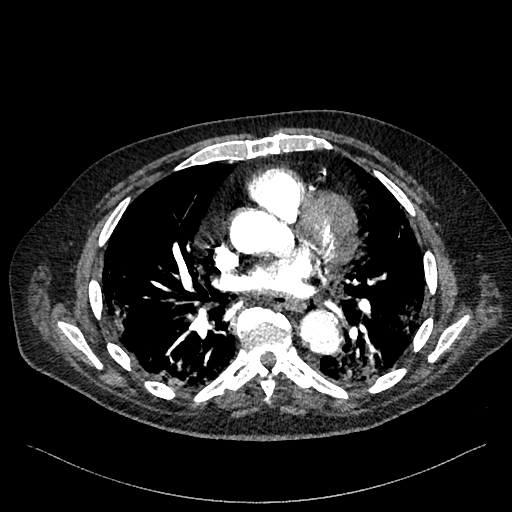
[im 280/504  lung]
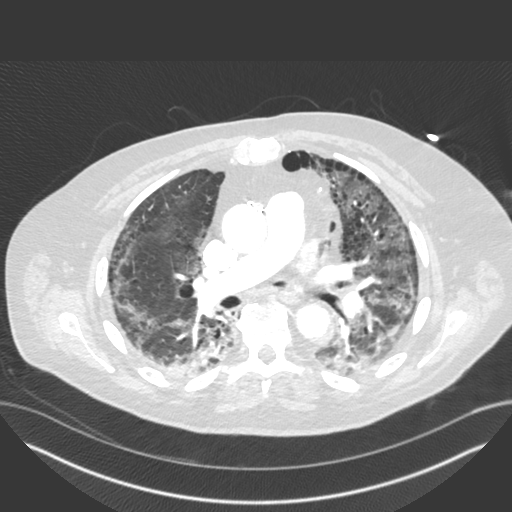
[im 308/504  soft-tissue]
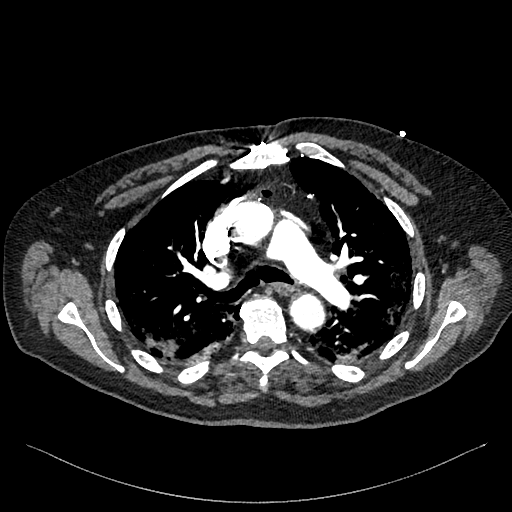
[im 336/504  lung]
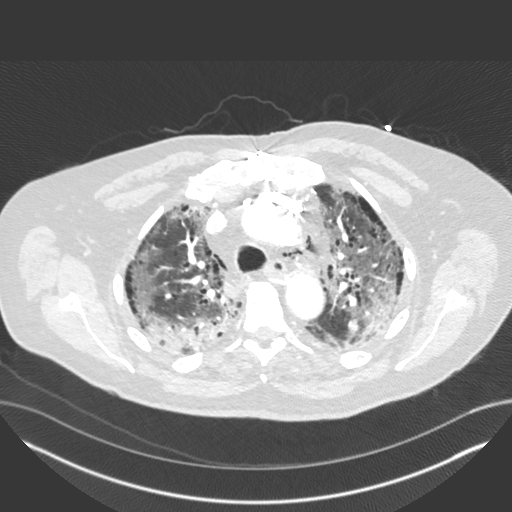
[im 392/504  soft-tissue]
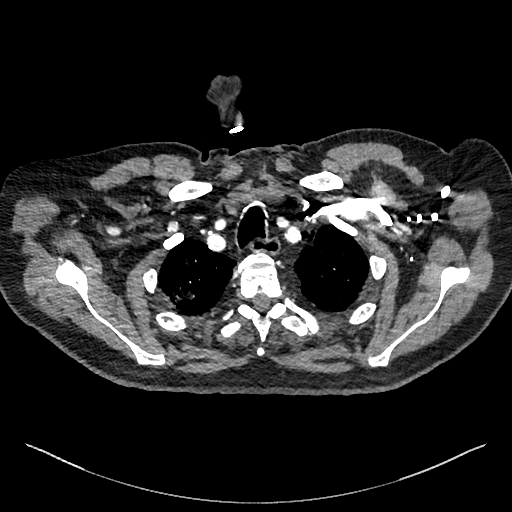
[im 420/504  lung]
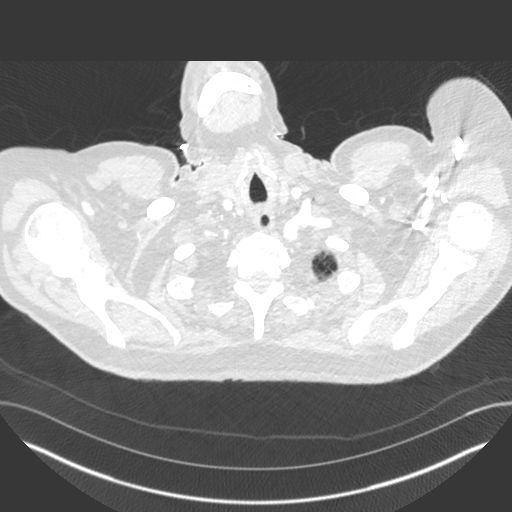
[im 448/504  soft-tissue]
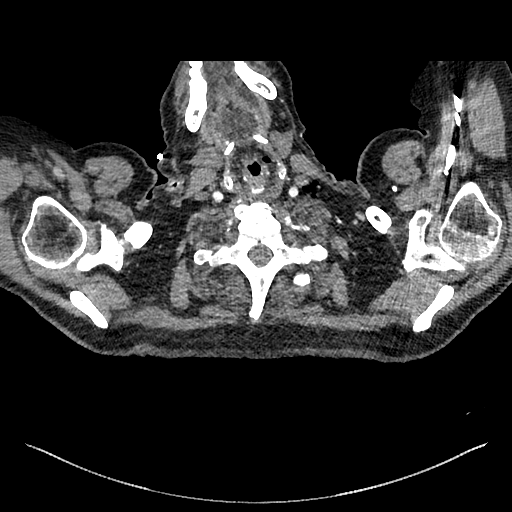
[im 476/504  lung]
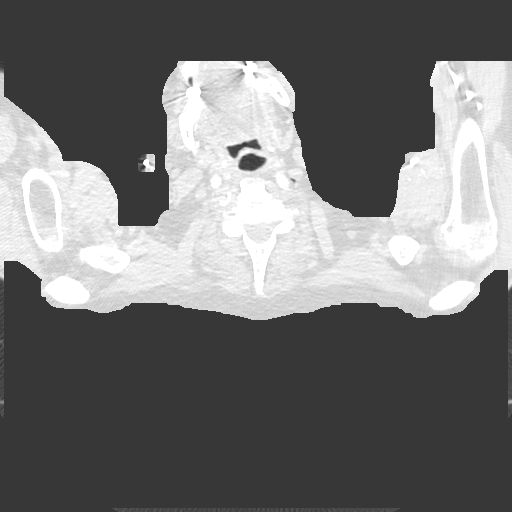

[Series 8: cor · coronal · 0.60mm/px · 3 of 162 slices shown]
[im 41/162  soft-tissue]
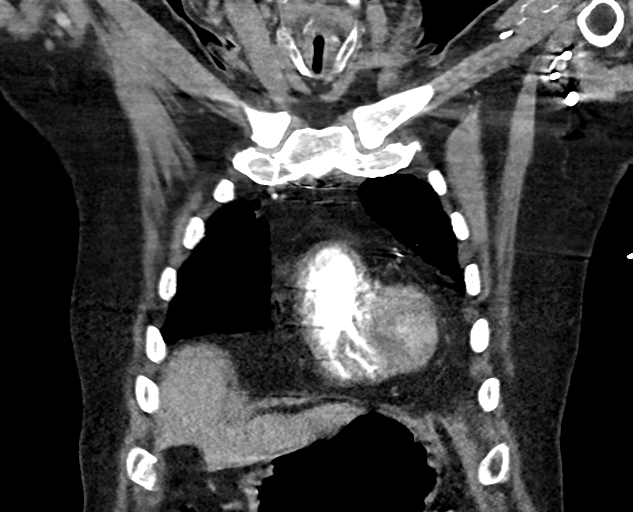
[im 81/162  soft-tissue]
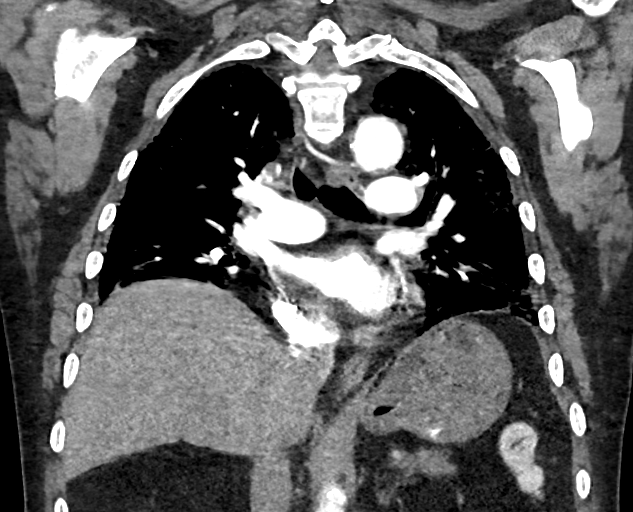
[im 121/162  soft-tissue]
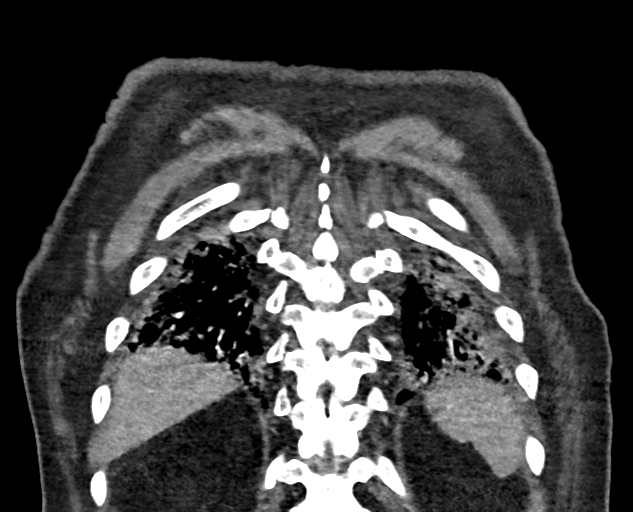

[18 of 46 positions shown; findings below may reference images not displayed]

FINDINGS: Evaluation of this exam is limited due to respiratory motion
artifact.

Cardiovascular: There is no cardiomegaly or pericardial effusion.
There is coronary vascular calcification and postsurgical changes of
CABG. Moderate calcified and noncalcified atherosclerotic plaque of
the thoracic aorta. Focal dilatation of the midthoracic descending
aorta measuring up to 3.5 cm similar to prior CT. No aortic
dissection. The origins of the great vessels of the aortic arch
appear patent as visualized. Evaluation of the pulmonary arteries is
limited due to respiratory motion artifact. No pulmonary artery
embolus identified.

Mediastinum/Nodes: No hilar or mediastinal adenopathy. The esophagus
and the thyroid gland are grossly unremarkable. No mediastinal fluid
collection. There is small to moderate pneumomediastinum.

Lungs/Pleura: Background of emphysema. Bilateral pulmonary opacities
with patchy areas of ground-glass opacity involving the peripheral
and subpleural lung most consistent with multifocal pneumonia and
suggestive of COVID pneumonia. Clinical correlation and follow-up
recommended. There is no pleural effusion pneumothorax. The central
airways are patent.

Upper Abdomen: Small gallstones.  Scattered colonic diverticula.

Musculoskeletal: Degenerative changes of the spine. Median
sternotomy wires. No acute osseous pathology.

Review of the MIP images confirms the above findings.
IMPRESSION: 1. No CT evidence of pulmonary embolism.
2. Small to moderate pneumomediastinum.
3. Multifocal pneumonia and suggestive of COVID pneumonia. Clinical
correlation and follow-up recommended.
4. Aortic Atherosclerosis ([id number]) and Emphysema ([id number].7).
